# Patient Record
Sex: Female | Born: 1985
Health system: Southern US, Community
[De-identification: ages and names within clinical notes are randomized; demographics above are authoritative.]

## PROBLEM LIST (undated history)

## (undated) DIAGNOSIS — I82409 Acute embolism and thrombosis of unspecified deep veins of unspecified lower extremity: Secondary | ICD-10-CM

## (undated) DIAGNOSIS — Z982 Presence of cerebrospinal fluid drainage device: Secondary | ICD-10-CM

## (undated) DIAGNOSIS — G919 Hydrocephalus, unspecified: Secondary | ICD-10-CM

## (undated) DIAGNOSIS — R569 Unspecified convulsions: Secondary | ICD-10-CM

## (undated) HISTORY — PX: VENTRICULOPERITONEAL SHUNT: SHX204

## (undated) HISTORY — DX: Acute embolism and thrombosis of unspecified deep veins of unspecified lower extremity: I82.409

## (undated) HISTORY — PX: CARDIAC SURGERY: SHX584

---

## 2013-01-05 ENCOUNTER — Emergency Department (HOSPITAL_COMMUNITY): Payer: Medicaid Other

## 2013-01-05 ENCOUNTER — Encounter (HOSPITAL_COMMUNITY): Payer: Self-pay | Admitting: Emergency Medicine

## 2013-01-05 ENCOUNTER — Emergency Department (HOSPITAL_COMMUNITY)
Admission: EM | Admit: 2013-01-05 | Discharge: 2013-01-06 | Disposition: A | Discharge status: Home / Self Care | Payer: Medicaid Other | Attending: Emergency Medicine | Admitting: Emergency Medicine

## 2013-01-05 DIAGNOSIS — R109 Unspecified abdominal pain: Secondary | ICD-10-CM | POA: Insufficient documentation

## 2013-01-05 DIAGNOSIS — K5900 Constipation, unspecified: Secondary | ICD-10-CM | POA: Insufficient documentation

## 2013-01-05 DIAGNOSIS — G40909 Epilepsy, unspecified, not intractable, without status epilepticus: Secondary | ICD-10-CM | POA: Insufficient documentation

## 2013-01-05 DIAGNOSIS — Z982 Presence of cerebrospinal fluid drainage device: Secondary | ICD-10-CM | POA: Insufficient documentation

## 2013-01-05 DIAGNOSIS — G808 Other cerebral palsy: Secondary | ICD-10-CM | POA: Insufficient documentation

## 2013-01-05 DIAGNOSIS — Z8669 Personal history of other diseases of the nervous system and sense organs: Secondary | ICD-10-CM | POA: Insufficient documentation

## 2013-01-05 DIAGNOSIS — Z79899 Other long term (current) drug therapy: Secondary | ICD-10-CM | POA: Insufficient documentation

## 2013-01-05 HISTORY — DX: Unspecified convulsions: R56.9

## 2013-01-05 HISTORY — DX: Presence of cerebrospinal fluid drainage device: Z98.2

## 2013-01-05 HISTORY — DX: Hydrocephalus, unspecified: G91.9

## 2013-01-05 LAB — CBC WITH DIFFERENTIAL/PLATELET
Basophils Absolute: 0 10*3/uL (ref 0.0–0.1)
Basophils Relative: 0 % (ref 0–1)
Eosinophils Absolute: 0.1 10*3/uL (ref 0.0–0.7)
Eosinophils Relative: 1 % (ref 0–5)
HCT: 43.8 % (ref 36.0–46.0)
Hemoglobin: 15.9 g/dL — ABNORMAL HIGH (ref 12.0–15.0)
Lymphocytes Relative: 37 % (ref 12–46)
Lymphs Abs: 2.8 10*3/uL (ref 0.7–4.0)
MCH: 29.1 pg (ref 26.0–34.0)
MCHC: 36.3 g/dL — ABNORMAL HIGH (ref 30.0–36.0)
MCV: 80.1 fL (ref 78.0–100.0)
Monocytes Absolute: 0.5 10*3/uL (ref 0.1–1.0)
Monocytes Relative: 6 % (ref 3–12)
Neutro Abs: 4.1 10*3/uL (ref 1.7–7.7)
Neutrophils Relative %: 55 % (ref 43–77)
Platelets: 254 10*3/uL (ref 150–400)
RBC: 5.47 MIL/uL — ABNORMAL HIGH (ref 3.87–5.11)
RDW: 13.6 % (ref 11.5–15.5)
WBC: 7.5 10*3/uL (ref 4.0–10.5)

## 2013-01-05 LAB — URINALYSIS, ROUTINE W REFLEX MICROSCOPIC
Hgb urine dipstick: NEGATIVE
Nitrite: NEGATIVE
Specific Gravity, Urine: 1.042 — ABNORMAL HIGH (ref 1.005–1.030)
pH: 5.5 (ref 5.0–8.0)

## 2013-01-05 LAB — COMPREHENSIVE METABOLIC PANEL
ALT: 22 U/L (ref 0–35)
AST: 21 U/L (ref 0–37)
Albumin: 4.2 g/dL (ref 3.5–5.2)
Alkaline Phosphatase: 139 U/L — ABNORMAL HIGH (ref 39–117)
BUN: 8 mg/dL (ref 6–23)
CO2: 30 mEq/L (ref 19–32)
Calcium: 9.8 mg/dL (ref 8.4–10.5)
Chloride: 102 mEq/L (ref 96–112)
Creatinine, Ser: 0.57 mg/dL (ref 0.50–1.10)
GFR calc Af Amer: >90 mL/min (ref >90)
GFR calc non Af Amer: >90 mL/min (ref >90)
Glucose, Bld: 94 mg/dL (ref 70–99)
Potassium: 5.2 mEq/L — ABNORMAL HIGH (ref 3.5–5.1)
Sodium: 141 mEq/L (ref 135–145)
Total Bilirubin: 0.2 mg/dL — ABNORMAL LOW (ref 0.3–1.2)
Total Protein: 8.1 g/dL (ref 6.0–8.3)

## 2013-01-05 NOTE — ED Notes (Signed)
Pt presenting to ed with c/o per mother pt is not eating and drinking pt is not behaving her normal self per family. Per mother pt also with a red spot on her breast

## 2013-01-05 NOTE — ED Provider Notes (Signed)
History     CSN: [id number]  Arrival date & time 01/05/13  1730   First MD Initiated Contact with Patient 01/05/13 2023      Chief Complaint  Patient presents with  . decreased appetite    (Consider location/radiation/quality/duration/timing/severity/associated sxs/prior treatment) HPI History provided by patient's mother.  Pt has h/o hydrocephalus w/ VP shunt as well as CP.  She has had a poor appetite for the past 4-5 days.  Pushes food and fluids away and makes a disgusted face when it is offered to her.  Has also been more drowsy and irritable than normal.  Associated w/ tactile fever and rhinorrhea a couple days ago. Has not had coughing, vomiting or diarrhea.  Is wetting her diapers and having normal bowel movements.  They are concerned that she is having pain somewhere.  They are also concerned that there could be a problem with her shunt.   Past Medical History  Diagnosis Date  . Seizures   . Hydrocephalus   . VP (ventriculoperitoneal) shunt status     History reviewed. No pertinent past surgical history.  No family history on file.  History  Substance Use Topics  . Smoking status: Never Smoker   . Smokeless tobacco: Not on file  . Alcohol Use: No    OB History    Grav Para Term Preterm Abortions TAB SAB Ect Mult Living                  Review of Systems  All other systems reviewed and are negative.    Allergies  Penicillins  Home Medications   Current Outpatient Rx  Name  Route  Sig  Dispense  Refill  . PHENOBARBITAL 64.8 MG PO TABS   Oral   Take 129.6 mg by mouth at bedtime.           BP 140/86  Pulse 89  Temp 98.5 F (36.9 C) (Oral)  Resp 12  SpO2 100%  LMP 12/29/2012  Physical Exam  Nursing note and vitals reviewed. Constitutional: She is oriented to person, place, and time. She appears well-developed and well-nourished. No distress.       Pt is very active and talkative.  Does not appear uncomfortable  HENT:  Head: Normocephalic  and atraumatic.  Eyes:       Normal appearance  Neck: Normal range of motion.  Cardiovascular: Normal rate and regular rhythm.   Pulmonary/Chest: Effort normal and breath sounds normal. No respiratory distress.  Abdominal: Soft. Bowel sounds are normal. She exhibits no distension and no mass. There is no rebound and no guarding.       Exam limited d/t patient's inability to communicate.  Pt reports pain w/ palpation of abdomen w/ grimacing.   Genitourinary:       No CVA tenderness  Musculoskeletal: Normal range of motion.  Neurological: She is alert and oriented to person, place, and time.  Skin: Skin is warm and dry. No rash noted.  Psychiatric: She has a normal mood and affect. Her behavior is normal.    ED Course  Procedures (including critical care time)  Labs Reviewed  CBC WITH DIFFERENTIAL - Abnormal; Notable for the following:    RBC 5.47 (*)     Hemoglobin 15.9 (*)     MCHC 36.3 (*)     All other components within normal limits  COMPREHENSIVE METABOLIC PANEL - Abnormal; Notable for the following:    Potassium 5.2 (*)     Alkaline Phosphatase 139 (*)  Total Bilirubin 0.2 (*)     All other components within normal limits  URINALYSIS, ROUTINE W REFLEX MICROSCOPIC - Abnormal; Notable for the following:    Specific Gravity, Urine 1.042 (*)     Bilirubin Urine SMALL (*)     All other components within normal limits   Ct Abdomen Pelvis Wo Contrast  01/05/2013  *RADIOLOGY REPORT*  Clinical Data: Abdominal pain.  CT ABDOMEN AND PELVIS WITHOUT CONTRAST  Technique:  Multidetector CT imaging of the abdomen and pelvis was performed following the standard protocol without intravenous contrast.  Comparison: None.  Findings: Lung bases are clear.  No effusions.  Heart is normal size.  Liver, gallbladder, spleen, pancreas, adrenals and kidneys have an unremarkable unenhanced appearance.  A large stool burden throughout the colon.  Ventriculoperitoneal shunt catheter is noted in place  with the tip in the pelvis.  Small amount of free fluid in the pelvis.  Appendix is visualized and is normal.  Small bowel is decompressed. Stomach grossly unremarkable.  Urinary bladder is decompressed, grossly unremarkable.  No acute bony abnormality.  IMPRESSION: Large stool burden throughout the colon.  No acute findings in the abdomen or pelvis.   Original Report Authenticated By: Charlett Nose, M.D.    Ct Head Wo Contrast  01/05/2013  *RADIOLOGY REPORT*  Clinical Data: Patient with a ventriculostomy shunt.  Drowsiness and abnormal bony year.  CT HEAD WITHOUT CONTRAST  Technique:  Contiguous axial images were obtained from the base of the skull through the vertex without contrast.  Comparison: None.  Findings: There is a ventriculostomy catheter from a left frontal approach with tip in the region of the third ventricle.  There is no hydrocephalus present.  In fact, the ventricles are slit-like. Basilar cisterns are patent.  There is no intracranial hemorrhage. There is no focal mass lesion.  No CT evidence of acute infarction. 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  Paranasal sinuses mastoid air cells are clear. There is an abandoned catheter on the right.  IMPRESSION: No hydrocephalus in patient with a ventriculostomy catheter.  No acute intracranial findings.  Agenesis of the corpus callosum.   Original Report Authenticated By: Genevive Bi, M.D.      1. Constipation       MDM  26yo  F w/ CP and hydrocephalus brought to ED by her parents for decreased appetite, drowsiness and irritability x 4-5 days.  They are concerned she is having pain somewhere.  On exam, afebrile, NAD, abd soft and non-distended but ttp.  Labs, including U/A are unremarkable.  CT head negative for hydrocephalus.  CT abd ordered d/t exam and patient's inability communicate and shows large stool burden.  All results discussed w/ patient's parents.  D/c'd home w/ 5 day course of miralax.  Recommended f/u w/ PCP.  Return  precautions discussed.        Otilio Miu, PA-C 01/06/13 [phone]

## 2013-01-05 NOTE — ED Notes (Signed)
Patient transported to CT 

## 2013-01-05 NOTE — ED Notes (Signed)
PA at bedside.

## 2013-01-06 MED ORDER — POLYETHYLENE GLYCOL 3350 17 G PO PACK: 17.0000 g | PACK | Freq: Every day | ORAL | Status: AC

## 2013-01-08 NOTE — ED Provider Notes (Signed)
Medical screening examination/treatment/procedure(s) were performed by non-physician practitioner and as supervising physician I was immediately available for consultation/collaboration.  Pt with decreased po intake. Difficult to tell why because pt with hc of cp and not really communicative. Seems to be at her baseline otherwise. Ct with no hydrocephalus. Ct a/p with no acute abnormalities. No abdominal tenderness. Oropharynx clear. No v/d. Etiology not clear at this time, but no clear evidence of emergent process. Feel stable for dc. Emergent return precautions discussed. outpt fu otherwise.  Raeford Razor, MD 01/08/13 2102

## 2013-03-28 DIAGNOSIS — Z8669 Personal history of other diseases of the nervous system and sense organs: Secondary | ICD-10-CM | POA: Insufficient documentation

## 2013-03-28 DIAGNOSIS — H548 Legal blindness, as defined in USA: Secondary | ICD-10-CM | POA: Insufficient documentation

## 2013-12-13 DIAGNOSIS — R1310 Dysphagia, unspecified: Secondary | ICD-10-CM | POA: Insufficient documentation

## 2015-06-25 ENCOUNTER — Emergency Department (HOSPITAL_COMMUNITY): Payer: Medicaid Other

## 2015-06-25 ENCOUNTER — Encounter (HOSPITAL_COMMUNITY): Payer: Self-pay | Admitting: Emergency Medicine

## 2015-06-25 ENCOUNTER — Emergency Department (HOSPITAL_COMMUNITY)
Admission: EM | Admit: 2015-06-25 | Discharge: 2015-06-25 | Disposition: A | Discharge status: Home / Self Care | Payer: Medicaid Other | Attending: Emergency Medicine | Admitting: Emergency Medicine

## 2015-06-25 DIAGNOSIS — Z79899 Other long term (current) drug therapy: Secondary | ICD-10-CM | POA: Insufficient documentation

## 2015-06-25 DIAGNOSIS — Z982 Presence of cerebrospinal fluid drainage device: Secondary | ICD-10-CM | POA: Insufficient documentation

## 2015-06-25 DIAGNOSIS — Z880 Allergy status to penicillin: Secondary | ICD-10-CM | POA: Insufficient documentation

## 2015-06-25 DIAGNOSIS — Z8669 Personal history of other diseases of the nervous system and sense organs: Secondary | ICD-10-CM | POA: Insufficient documentation

## 2015-06-25 DIAGNOSIS — R0981 Nasal congestion: Secondary | ICD-10-CM | POA: Insufficient documentation

## 2015-06-25 DIAGNOSIS — R05 Cough: Secondary | ICD-10-CM | POA: Diagnosis not present

## 2015-06-25 DIAGNOSIS — R059 Cough, unspecified: Secondary | ICD-10-CM

## 2015-06-25 MED ORDER — GUAIFENESIN 100 MG/5ML PO LIQD: 100.0000 mg | ORAL | Status: DC | PRN

## 2015-06-25 MED ORDER — SALINE SPRAY 0.65 % NA SOLN: 1.0000 | NASAL | Status: DC | PRN

## 2015-06-25 MED ORDER — FLUTICASONE PROPIONATE 50 MCG/ACT NA SUSP: 2.0000 | Freq: Every day | NASAL | Status: DC

## 2015-06-25 NOTE — ED Provider Notes (Signed)
CSN: [id number]     Arrival date & time 06/25/15  [id number] History   First MD Initiated Contact with Patient 06/25/15 [phone]     Chief Complaint  Patient presents with  . Cough     (Consider location/radiation/quality/duration/timing/severity/associated sxs/prior Treatment) HPI Tracey Graham is a 29 y.o. female with history of MR, hydrocephalus with VP shunt, seizures, presents to emergency department complaining of nasal congestion, cough for 2 weeks. Mother is providing the history. Patient unable to blow her nose or cough up the mucosa mother is concerned. She went to her doctor 5 days ago while start amoxicillin which she finished yesterday. No other medications are given for her symptoms. Mother denies any history of seasonal allergies. There has not been any fever. Mother did note as the patient is not eating or drinking as much. She is concerned that she might be dehydrated. Patient otherwise does not appear to be in any distress. Father is concerned because he recently had pneumonia.  Past Medical History  Diagnosis Date  . Seizures   . Hydrocephalus   . VP (ventriculoperitoneal) shunt status    History reviewed. No pertinent past surgical history. No family history on file. History  Substance Use Topics  . Smoking status: Never Smoker   . Smokeless tobacco: Not on file  . Alcohol Use: No   OB History    No data available     Review of Systems  Unable to perform ROS: Patient nonverbal  Constitutional: Negative for fever and chills.  HENT: Positive for congestion.   Respiratory: Positive for cough.   Gastrointestinal: Negative for vomiting and diarrhea.  Skin: Negative for rash.      Allergies  Penicillins  Home Medications   Prior to Admission medications   Medication Sig Start Date End Date Taking? Authorizing Provider  amoxicillin (AMOXIL) 250 MG/5ML suspension Take 10 mLs by mouth 2 (two) times daily. For 5 days 06/20/15 06/25/15 Yes Historical Provider, MD   LORATADINE CHILDRENS 5 MG/5ML syrup Take 10 mLs by mouth daily. 06/20/15  Yes Historical Provider, MD  PHENobarbital (LUMINAL) 64.8 MG tablet Take 129.6 mg by mouth at bedtime.   Yes Historical Provider, MD  polyethylene glycol (MIRALAX) packet Take 17 g by mouth daily. Patient not taking: Reported on 06/25/2015 01/06/13   [name], PA-C   BP 145/99 mmHg  Pulse 89  Temp(Src) 97.9 F (36.6 C) (Oral)  Resp 18  SpO2 98% Physical Exam  Constitutional: She appears well-developed and well-nourished. No distress.  HENT:  Head: Normocephalic.  Right Ear: Tympanic membrane, external ear and ear canal normal.  Left Ear: Tympanic membrane, external ear and ear canal normal.  Nose: Mucosal edema and rhinorrhea present.  Mouth/Throat: Uvula is midline, oropharynx is clear and moist and mucous membranes are normal. No oropharyngeal exudate, posterior oropharyngeal edema, posterior oropharyngeal erythema or tonsillar abscesses.  Eyes: Conjunctivae are normal.  Neck: Neck supple.  Cardiovascular: Normal rate, regular rhythm and normal heart sounds.   Pulmonary/Chest: Effort normal and breath sounds normal. No respiratory distress. She has no wheezes. She has no rales.  Musculoskeletal: She exhibits no edema.  Neurological: She is alert.  Skin: Skin is warm and dry.  Psychiatric: She has a normal mood and affect. Her behavior is normal.  Nursing note and vitals reviewed.   ED Course  Procedures (including critical care time) Labs Review Labs Reviewed - No data to display  Imaging Review Dg Chest 2 View  06/25/2015   CLINICAL DATA:  Persistent cough  and congestion for 2 weeks after antibiotic therapy.  EXAM: CHEST  2 VIEW  COMPARISON:  None.  FINDINGS: Trachea is midline. Heart size normal. Lungs are somewhat low in volume with probable linear atelectasis or scarring in the medial left lower lobe. No airspace consolidation or pleural fluid.  Ventriculoperitoneal shunt catheter overlies  the left neck and chest as well as left upper quadrant. Calcification is seen along the proximal portion of the catheter. Visualized portion of the upper abdomen is unremarkable.  IMPRESSION: Low lung volumes without acute finding.   Electronically Signed   By: Leanna Battles M.D.   On: 06/25/2015 10:03     EKG Interpretation None      MDM   Final diagnoses:  Cough  Nasal congestion    Pt here with cough and nasal congestion for 2 wks. Pt with MR, unable to give any history. Family providing hx. Pt in NAD. VS are within normal. No fever. Not hypoxic. No tachycardia. Will get cxr.    10:46 AM CXR negative. Discussed with Dr. Micheline Maze, who has seen pt as well. Agrees this is most likely viral URI vs allergies. Just finished course of amoxil, do not think she needs additional antibiotics. Home with saline, flonase, mucinex. Follow up with pcp.   Filed Vitals:   06/25/15 0851 06/25/15 1040  BP: 145/99 135/96  Pulse: 89 88  Temp: 97.9 F (36.6 C) 98 F (36.7 C)  TempSrc: Oral Oral  Resp: 18 18  SpO2: 98% 99%       Jaynie Crumble, PA-C 06/25/15 1452  Toy Cookey, MD 06/27/15 1226

## 2015-06-25 NOTE — ED Notes (Signed)
Pt complaint of continued cough for 2 weeks post completion of amoxicillin dosage last night.

## 2015-06-25 NOTE — ED Notes (Signed)
Docherty at bedside. 

## 2015-06-25 NOTE — ED Notes (Signed)
Pt transported to [facility].  

## 2015-06-25 NOTE — Discharge Instructions (Signed)
flonase daily for nasal congestion. Use saline up to every 2 hrs to help thin nasal mucus. Robitussin as needed for cough. Follow up with primary care for recheck. No evidence for pneumonia today.   Upper Respiratory Infection, Adult An upper respiratory infection (URI) is also sometimes known as the common cold. The upper respiratory tract includes the nose, sinuses, throat, trachea, and bronchi. Bronchi are the airways leading to the lungs. Most people improve within 1 week, but symptoms can last up to 2 weeks. A residual cough may last even longer.  CAUSES Many different viruses can infect the tissues lining the upper respiratory tract. The tissues become irritated and inflamed and often become very moist. Mucus production is also common. A cold is contagious. You can easily spread the virus to others by oral contact. This includes kissing, sharing a glass, coughing, or sneezing. Touching your mouth or nose and then touching a surface, which is then touched by another person, can also spread the virus. SYMPTOMS  Symptoms typically develop 1 to 3 days after you come in contact with a cold virus. Symptoms vary from person to person. They may include:  Runny nose.  Sneezing.  Nasal congestion.  Sinus irritation.  Sore throat.  Loss of voice (laryngitis).  Cough.  Fatigue.  Muscle aches.  Loss of appetite.  Headache.  Low-grade fever. DIAGNOSIS  You might diagnose your own cold based on familiar symptoms, since most people get a cold 2 to 3 times a year. Your caregiver can confirm this based on your exam. Most importantly, your caregiver can check that your symptoms are not due to another disease such as strep throat, sinusitis, pneumonia, asthma, or epiglottitis. Blood tests, throat tests, and X-rays are not necessary to diagnose a common cold, but they may sometimes be helpful in excluding other more serious diseases. Your caregiver will decide if any further tests are  required. RISKS AND COMPLICATIONS  You may be at risk for a more severe case of the common cold if you smoke cigarettes, have chronic heart disease (such as heart failure) or lung disease (such as asthma), or if you have a weakened immune system. The very young and very old are also at risk for more serious infections. Bacterial sinusitis, middle ear infections, and bacterial pneumonia can complicate the common cold. The common cold can worsen asthma and chronic obstructive pulmonary disease (COPD). Sometimes, these complications can require emergency medical care and may be life-threatening. PREVENTION  The best way to protect against getting a cold is to practice good hygiene. Avoid oral or hand contact with people with cold symptoms. Wash your hands often if contact occurs. There is no clear evidence that vitamin C, vitamin E, echinacea, or exercise reduces the chance of developing a cold. However, it is always recommended to get plenty of rest and practice good nutrition. TREATMENT  Treatment is directed at relieving symptoms. There is no cure. Antibiotics are not effective, because the infection is caused by a virus, not by bacteria. Treatment may include:  Increased fluid intake. Sports drinks offer valuable electrolytes, sugars, and fluids.  Breathing heated mist or steam (vaporizer or shower).  Eating chicken soup or other clear broths, and maintaining good nutrition.  Getting plenty of rest.  Using gargles or lozenges for comfort.  Controlling fevers with ibuprofen or acetaminophen as directed by your caregiver.  Increasing usage of your inhaler if you have asthma. Zinc gel and zinc lozenges, taken in the first 24 hours of the common cold,  can shorten the duration and lessen the severity of symptoms. Pain medicines may help with fever, muscle aches, and throat pain. A variety of non-prescription medicines are available to treat congestion and runny nose. Your caregiver can make  recommendations and may suggest nasal or lung inhalers for other symptoms.  HOME CARE INSTRUCTIONS   Only take over-the-counter or prescription medicines for pain, discomfort, or fever as directed by your caregiver.  Use a warm mist humidifier or inhale steam from a shower to increase air moisture. This may keep secretions moist and make it easier to breathe.  Drink enough water and fluids to keep your urine clear or pale yellow.  Rest as needed.  Return to work when your temperature has returned to normal or as your caregiver advises. You may need to stay home longer to avoid infecting others. You can also use a face mask and careful hand washing to prevent spread of the virus. SEEK MEDICAL CARE IF:   After the first few days, you feel you are getting worse rather than better.  You need your caregiver's advice about medicines to control symptoms.  You develop chills, worsening shortness of breath, or brown or red sputum. These may be signs of pneumonia.  You develop yellow or brown nasal discharge or pain in the face, especially when you bend forward. These may be signs of sinusitis.  You develop a fever, swollen neck glands, pain with swallowing, or white areas in the back of your throat. These may be signs of strep throat. SEEK IMMEDIATE MEDICAL CARE IF:   You have a fever.  You develop severe or persistent headache, ear pain, sinus pain, or chest pain.  You develop wheezing, a prolonged cough, cough up blood, or have a change in your usual mucus (if you have chronic lung disease).  You develop sore muscles or a stiff neck. Document Released: 06/08/2001 Document Revised: 03/06/2012 Document Reviewed: 03/20/2014 [facility] Patient Information 2015 [place], [place]. This information is not intended to replace advice given to you by your health care provider. Make sure you discuss any questions you have with your health care provider.

## 2015-06-25 NOTE — ED Notes (Signed)
Pt returned from DG; awaiting results. 

## 2015-06-25 NOTE — ED Notes (Signed)
Tracey Graham at bedside 

## 2015-11-18 ENCOUNTER — Emergency Department (HOSPITAL_COMMUNITY)
Admission: EM | Admit: 2015-11-18 | Discharge: 2015-11-18 | Disposition: A | Discharge status: Home / Self Care | Payer: Medicaid Other | Attending: Emergency Medicine | Admitting: Emergency Medicine

## 2015-11-18 ENCOUNTER — Encounter (HOSPITAL_COMMUNITY): Payer: Self-pay | Admitting: Emergency Medicine

## 2015-11-18 DIAGNOSIS — Z79899 Other long term (current) drug therapy: Secondary | ICD-10-CM | POA: Insufficient documentation

## 2015-11-18 DIAGNOSIS — Z982 Presence of cerebrospinal fluid drainage device: Secondary | ICD-10-CM | POA: Insufficient documentation

## 2015-11-18 DIAGNOSIS — Z880 Allergy status to penicillin: Secondary | ICD-10-CM | POA: Diagnosis not present

## 2015-11-18 DIAGNOSIS — H109 Unspecified conjunctivitis: Secondary | ICD-10-CM | POA: Diagnosis not present

## 2015-11-18 DIAGNOSIS — Z7951 Long term (current) use of inhaled steroids: Secondary | ICD-10-CM | POA: Insufficient documentation

## 2015-11-18 DIAGNOSIS — H02849 Edema of unspecified eye, unspecified eyelid: Secondary | ICD-10-CM

## 2015-11-18 DIAGNOSIS — H0419 Other specified disorders of lacrimal gland: Secondary | ICD-10-CM | POA: Diagnosis not present

## 2015-11-18 DIAGNOSIS — Z8669 Personal history of other diseases of the nervous system and sense organs: Secondary | ICD-10-CM | POA: Insufficient documentation

## 2015-11-18 DIAGNOSIS — H540 Blindness, both eyes: Secondary | ICD-10-CM | POA: Diagnosis not present

## 2015-11-18 DIAGNOSIS — H5713 Ocular pain, bilateral: Secondary | ICD-10-CM | POA: Diagnosis present

## 2015-11-18 DIAGNOSIS — H11423 Conjunctival edema, bilateral: Secondary | ICD-10-CM | POA: Diagnosis not present

## 2015-11-18 MED ORDER — IBUPROFEN 800 MG PO TABS
800.0000 mg | ORAL_TABLET | Freq: Once | ORAL | Status: DC
  Filled 2015-11-18: qty 1

## 2015-11-18 MED ORDER — IBUPROFEN 100 MG/5ML PO SUSP: 400.0000 mg | Freq: Four times a day (QID) | ORAL | Status: DC | PRN

## 2015-11-18 MED ORDER — IBUPROFEN 100 MG/5ML PO SUSP
800.0000 mg | Freq: Once | ORAL | Status: AC
  Administered 2015-11-18: 800 mg via ORAL
  Filled 2015-11-18 (×2): qty 40

## 2015-11-18 MED ORDER — DIPHENHYDRAMINE HCL 12.5 MG/5ML PO ELIX
25.0000 mg | ORAL_SOLUTION | Freq: Once | ORAL | Status: AC
  Administered 2015-11-18: 25 mg via ORAL
  Filled 2015-11-18: qty 10

## 2015-11-18 MED ORDER — ERYTHROMYCIN 5 MG/GM OP OINT: TOPICAL_OINTMENT | OPHTHALMIC | Status: DC

## 2015-11-18 MED ORDER — DIPHENHYDRAMINE HCL 12.5 MG/5ML PO SYRP: 25.0000 mg | ORAL_SOLUTION | ORAL | Status: DC | PRN

## 2015-11-18 MED ORDER — DIPHENHYDRAMINE HCL 25 MG PO CAPS
25.0000 mg | ORAL_CAPSULE | Freq: Once | ORAL | Status: DC
  Filled 2015-11-18: qty 1

## 2015-11-18 MED ORDER — DIPHENHYDRAMINE HCL 25 MG PO CAPS: 25.0000 mg | ORAL_CAPSULE | Freq: Four times a day (QID) | ORAL | Status: DC | PRN

## 2015-11-18 MED ORDER — IBUPROFEN 800 MG PO TABS: 800.0000 mg | ORAL_TABLET | Freq: Three times a day (TID) | ORAL | Status: DC

## 2015-11-18 NOTE — ED Provider Notes (Signed)
CSN: [id number]     Arrival date & time 11/18/15  1103 History  By signing my name below, I, Tracey Graham, attest that this documentation has been prepared under the direction and in the presence of [name], PA-C. Electronically Signed: Tanda RockersMargaux Graham, ED Scribe. 11/18/2015. 12:02 PM.  Chief Complaint  Patient presents with  . Eye Pain  . Eye Drainage   The history is provided by the patient and a parent. No language interpreter was used.     HPI Comments: Tracey Graham is a 29 y.o. female with hx blindness in both eyes who presents to the Emergency Department complaining of gradual onset, constant, bilateral eye swelling that occurred this morning upon waking up. Family reports that pt seems to be in a lot of pain due to her crying. Mom mentions that pt had white drainage from both her eyes this morning as well. Pt has never had symptoms like this in the past. Denies fever or any other associated symptoms. Pt does not wear contact lenses.   Past Medical History  Diagnosis Date  . Seizures (HCC)   . Hydrocephalus   . VP (ventriculoperitoneal) shunt status    Past Surgical History  Procedure Laterality Date  . Ventriculoperitoneal shunt     Family History  Problem Relation Age of Onset  . Hypertension Mother   . Hypertension Father   . Diabetes Father    Social History  Substance Use Topics  . Smoking status: Never Smoker   . Smokeless tobacco: None  . Alcohol Use: No   OB History    No data available     Review of Systems  A complete 10 system review of systems was obtained and all systems are negative except as noted in the HPI and PMH.   Allergies  Penicillins  Home Medications   Prior to Admission medications   Medication Sig Start Date End Date Taking? Authorizing Provider  diphenhydrAMINE (BENADRYL) 25 mg capsule Take 1 capsule (25 mg total) by mouth every 6 (six) hours as needed. 11/18/15   Cheri FowlerKayla Shraddha Lebron, PA-C  erythromycin ophthalmic ointment Place a  1/2 inch ribbon of ointment into the lower eyelid QID for 5-7 days. 11/18/15   [name], PA-C  fluticasone (FLONASE) 50 MCG/ACT nasal spray Place 2 sprays into both nostrils daily. 06/25/15   [name], PA-C  guaiFENesin (ROBITUSSIN) 100 MG/5ML liquid Take 5-10 mLs (100-200 mg total) by mouth every 4 (four) hours as needed for cough. 06/25/15   [name], PA-C  ibuprofen (ADVIL,MOTRIN) 800 MG tablet Take 1 tablet (800 mg total) by mouth 3 (three) times daily. 11/18/15   [name], PA-C  LORATADINE CHILDRENS 5 MG/5ML syrup Take 10 mLs by mouth daily. 06/20/15   Historical Provider, MD  PHENobarbital (LUMINAL) 64.8 MG tablet Take 129.6 mg by mouth at bedtime.    Historical Provider, MD  polyethylene glycol (MIRALAX) packet Take 17 g by mouth daily. Patient not taking: Reported on 06/25/2015 01/06/13   [name], PA-C  sodium chloride (OCEAN) 0.65 % SOLN nasal spray Place 1 spray into both nostrils as needed for congestion. 06/25/15   Jaynie Crumbleatyana Kirichenko, PA-C   Triage Vitals: BP 144/97 mmHg  Pulse 95  Temp(Src) 97.8 F (36.6 C) (Oral)  Resp 18  Wt 150 lb (68.04 kg)  SpO2 100%  LMP 10/08/2015 (Approximate)   Physical Exam  Constitutional: She appears well-developed and well-nourished. Distressed: crying.  Special needs adult in wheelchair  HENT:  Head: Normocephalic and atraumatic.  Mouth/Throat:  Oropharynx is clear and moist.  Eyes: Pupils are equal, round, and reactive to light. Lids are everted and swept, no foreign bodies found. Right eye exhibits chemosis. Right eye exhibits no discharge and no exudate. No foreign body present in the right eye. Left eye exhibits chemosis. Left eye exhibits no discharge and no exudate. No foreign body present in the left eye. Right conjunctiva is injected. Left conjunctiva is not injected.  Neck: Normal range of motion. Neck supple.  Cardiovascular: Normal rate, regular rhythm and normal heart sounds.   Pulmonary/Chest: Effort  normal and breath sounds normal.  Abdominal: Soft. Bowel sounds are normal. She exhibits no distension. There is no tenderness.  Musculoskeletal: Normal range of motion.  Lymphadenopathy:    She has no cervical adenopathy.  Neurological: She is alert.  She is alert, but she is unable to follow commands.  Skin: Skin is warm and dry. She is not diaphoretic.  Psychiatric: She has a normal mood and affect. Her behavior is normal.    ED Course  Procedures (including critical care time)  DIAGNOSTIC STUDIES: Oxygen Saturation is 100% on RA, normal by my interpretation.    COORDINATION OF CARE: 12:01 PM-Discussed treatment plan which includes fluorescein strip test with pt at bedside and pt agreed to plan.   Labs Review Labs Reviewed - No data to display  Imaging Review No results found.   EKG Interpretation None      MDM   Final diagnoses:  Eyelid gland swelling, unspecified laterality  Bilateral conjunctivitis   Patient presents with b/l eye swelling, pain, and chemosis since this AM.  Patient non-toxic appearing, VSS.  On exam, b/l eyelids are swollen w/out erythema.  Chemosis bilaterally.  Conjunctiva mildly injected.  Mother reports some drainage, none visualized here.  No FB visualization.  Suspect irritation from itching throughout the night.  Possible conjunctivitis.  Doubt orbital cellulitis.  Doubt FB.  Doubt corneal abrasion/ulcer.  Will give benadryl and ibuprofen.  Will d/c home with erythromycin and warm compresses.   Case has been discussed with and seen by Dr. Patria Mane who agrees with the above plan for discharge and opthalmology follow up.   Evaluation does not show pathology requring ongoing emergent intervention or admission. Pt is hemodynamically stable and mentating appropriately. Discussed findings/results and plan with patient/guardian, who agrees with plan. All questions answered. Return precautions discussed and outpatient follow up given.    I personally  performed the services described in this documentation, which was scribed in my presence. The recorded information has been reviewed and is accurate.      Cheri Fowler, PA-C 11/18/15 1302  Azalia Bilis, MD 11/18/15 [phone]

## 2015-11-18 NOTE — Discharge Instructions (Signed)
Bacterial Conjunctivitis °Bacterial conjunctivitis, commonly called pink eye, is an inflammation of the clear membrane that covers the white part of the eye (conjunctiva). The inflammation can also happen on the underside of the eyelids. The blood vessels in the conjunctiva become inflamed, causing the eye to become red or pink. Bacterial conjunctivitis may spread easily from one eye to another and from person to person (contagious).  °CAUSES  °Bacterial conjunctivitis is caused by bacteria. The bacteria may come from your own skin, your upper respiratory tract, or from someone else with bacterial conjunctivitis. °SYMPTOMS  °The normally white color of the eye or the underside of the eyelid is usually pink or red. The pink eye is usually associated with irritation, tearing, and some sensitivity to light. Bacterial conjunctivitis is often associated with a thick, yellowish discharge from the eye. The discharge may turn into a crust on the eyelids overnight, which causes your eyelids to stick together. If a discharge is present, there may also be some blurred vision in the affected eye. °DIAGNOSIS  °Bacterial conjunctivitis is diagnosed by your caregiver through an eye exam and the symptoms that you report. Your caregiver looks for changes in the surface tissues of your eyes, which may point to the specific type of conjunctivitis. A sample of any discharge may be collected on a cotton-tip swab if you have a severe case of conjunctivitis, if your cornea is affected, or if you keep getting repeat infections that do not respond to treatment. The sample will be sent to a lab to see if the inflammation is caused by a bacterial infection and to see if the infection will respond to antibiotic medicines. °TREATMENT  °1. Bacterial conjunctivitis is treated with antibiotics. Antibiotic eyedrops are most often used. However, antibiotic ointments are also available. Antibiotics pills are sometimes used. Artificial tears or eye  washes may ease discomfort. °HOME CARE INSTRUCTIONS  °1. To ease discomfort, apply a cool, clean washcloth to your eye for 10-20 minutes, 3-4 times a day. °2. Gently wipe away any drainage from your eye with a warm, wet washcloth or a cotton ball. °3. Wash your hands often with soap and water. Use paper towels to dry your hands. °4. Do not share towels or washcloths. This may spread the infection. °5. Change or wash your pillowcase every day. °6. You should not use eye makeup until the infection is gone. °7. Do not operate machinery or drive if your vision is blurred. °8. Stop using contact lenses. Ask your caregiver how to sterilize or replace your contacts before using them again. This depends on the type of contact lenses that you use. °9. When applying medicine to the infected eye, do not touch the edge of your eyelid with the eyedrop bottle or ointment tube. °SEEK IMMEDIATE MEDICAL CARE IF:  °· Your infection has not improved within 3 days after beginning treatment. °· You had yellow discharge from your eye and it returns. °· You have increased eye pain. °· Your eye redness is spreading. °· Your vision becomes blurred. °· You have a fever or persistent symptoms for more than 2-3 days. °· You have a fever and your symptoms suddenly get worse. °· You have facial pain, redness, or swelling. °MAKE SURE YOU:  °· Understand these instructions. °· Will watch your condition. °· Will get help right away if you are not doing well or get worse. °  °This information is not intended to replace advice given to you by your health care provider. Make sure you   discuss any questions you have with your health care provider. °  °Document Released: 12/13/2005 Document Revised: 01/03/2015 Document Reviewed: 05/15/2012 °Elsevier Interactive Patient Education ©2016 [employer] ° °How to Use Eye Drops and Eye Ointments °HOW TO APPLY EYE DROPS °Follow these steps when applying eye drops: °2. Wash your hands. °3. Tilt your head  back. °4. Put a finger under your eye and use it to gently pull your lower lid downward. Keep that finger in place. °5. Using your other hand, hold the dropper between your thumb and index finger. °6. Position the dropper just over the edge of the lower lid. Hold it as close to your eye as you can without touching the dropper to your eye. °7. Steady your hand. One way to do this is to lean your index finger against your brow. °8. Look up. °9. Slowly and gently squeeze one drop of medicine into your eye. °10. Close your eye. °11. Place a finger between your lower eyelid and your nose. Press gently for 2 minutes. This increases the amount of time that the medicine is exposed to the eye. It also reduces side effects that can develop if the drop gets into the bloodstream through the nose. °HOW TO APPLY EYE OINTMENTS °Follow these steps when applying eye ointments: °10. Wash your hands. °11. Put a finger under your eye and use it to gently pull your lower lid downward. Keep that finger in place. °12. Using your other hand, place the tip of the tube between your thumb and index finger with the remaining fingers braced against your cheek or nose. °13. Hold the tube just over the edge of your lower lid without touching the tube to your lid or eyeball. °14. Look up. °15. Line the inner part of your lower lid with ointment. °16. Gently pull up on your upper lid and look down. This will force the ointment to spread over the surface of the eye. °17. Release the upper lid. °18. If you can, close your eyes for 1-2 minutes. °Do not rub your eyes. If you applied the ointment correctly, your vision will be blurry for a few minutes. This is normal. °ADDITIONAL INFORMATION °· Make sure to use the eye drops or ointment as told by your health care provider. °· If you have been told to use both eye drops and an eye ointment, apply the eye drops first, then wait 3-4 minutes before you apply the ointment. °· Try not to touch the tip of the  dropper or tube to your eye. A dropper or tube that has touched the eye can become contaminated. °  °This information is not intended to replace advice given to you by your health care provider. Make sure you discuss any questions you have with your health care provider. °  °Document Released: 03/21/2001 Document Revised: 04/29/2015 Document Reviewed: 12/09/2014 °Elsevier Interactive Patient Education ©2016 [employer] ° °

## 2015-11-18 NOTE — ED Notes (Signed)
Parents report that patient awoke, crying out in pain, c/o eye pain. Both eye lids are swollen , cloudy drainage noted, eye red. Pt is special needs adult. Responds appropriate to clear verbal  instructions

## 2016-01-09 ENCOUNTER — Encounter (HOSPITAL_COMMUNITY): Payer: Self-pay | Admitting: Emergency Medicine

## 2016-01-09 ENCOUNTER — Emergency Department (HOSPITAL_COMMUNITY)
Admission: EM | Admit: 2016-01-09 | Discharge: 2016-01-09 | Disposition: A | Discharge status: Home / Self Care | Payer: Medicaid Other | Attending: Emergency Medicine | Admitting: Emergency Medicine

## 2016-01-09 ENCOUNTER — Emergency Department (HOSPITAL_COMMUNITY): Payer: Medicaid Other

## 2016-01-09 DIAGNOSIS — K5900 Constipation, unspecified: Secondary | ICD-10-CM | POA: Diagnosis not present

## 2016-01-09 DIAGNOSIS — Z880 Allergy status to penicillin: Secondary | ICD-10-CM | POA: Insufficient documentation

## 2016-01-09 DIAGNOSIS — Z79899 Other long term (current) drug therapy: Secondary | ICD-10-CM | POA: Insufficient documentation

## 2016-01-09 LAB — CBC WITH DIFFERENTIAL/PLATELET
BASOS ABS: 0 10*3/uL (ref 0.0–0.1)
Basophils Relative: 0 %
EOS PCT: 0 %
Eosinophils Absolute: 0 10*3/uL (ref 0.0–0.7)
HCT: 41.2 % (ref 36.0–46.0)
Hemoglobin: 15 g/dL (ref 12.0–15.0)
LYMPHS ABS: 1.1 10*3/uL (ref 0.7–4.0)
LYMPHS PCT: 11 %
MCH: 29.2 pg (ref 26.0–34.0)
MCHC: 36.4 g/dL — ABNORMAL HIGH (ref 30.0–36.0)
MCV: 80.2 fL (ref 78.0–100.0)
MONO ABS: 0.8 10*3/uL (ref 0.1–1.0)
Monocytes Relative: 8 %
Neutro Abs: 8 10*3/uL — ABNORMAL HIGH (ref 1.7–7.7)
Neutrophils Relative %: 81 %
PLATELETS: 185 10*3/uL (ref 150–400)
RBC: 5.14 MIL/uL — ABNORMAL HIGH (ref 3.87–5.11)
RDW: 13.8 % (ref 11.5–15.5)
WBC: 9.9 10*3/uL (ref 4.0–10.5)

## 2016-01-09 LAB — COMPREHENSIVE METABOLIC PANEL
ALK PHOS: 134 U/L — AB (ref 38–126)
ALT: 20 U/L (ref 14–54)
AST: 18 U/L (ref 15–41)
Albumin: 4.2 g/dL (ref 3.5–5.0)
Anion gap: 13 (ref 5–15)
BILIRUBIN TOTAL: 0.5 mg/dL (ref 0.3–1.2)
BUN: 6 mg/dL (ref 6–20)
CALCIUM: 9.5 mg/dL (ref 8.9–10.3)
CO2: 22 mmol/L (ref 22–32)
CREATININE: 0.59 mg/dL (ref 0.44–1.00)
Chloride: 102 mmol/L (ref 101–111)
GFR calc non Af Amer: >60 mL/min (ref >60)
Glucose, Bld: 107 mg/dL — ABNORMAL HIGH (ref 65–99)
Potassium: 4.2 mmol/L (ref 3.5–5.1)
SODIUM: 137 mmol/L (ref 135–145)
TOTAL PROTEIN: 7.1 g/dL (ref 6.5–8.1)

## 2016-01-09 LAB — HCG, SERUM, QUALITATIVE: PREG SERUM: NEGATIVE

## 2016-01-09 LAB — LIPASE, BLOOD: Lipase: 16 U/L (ref 11–51)

## 2016-01-09 MED ORDER — FLEET ENEMA 7-19 GM/118ML RE ENEM
1.0000 | ENEMA | Freq: Once | RECTAL | Status: AC
  Administered 2016-01-09: 1 via RECTAL
  Filled 2016-01-09: qty 1

## 2016-01-09 MED ORDER — DOCUSATE SODIUM 50 MG/5ML PO LIQD: 100.0000 mg | Freq: Every day | ORAL | Status: DC | PRN

## 2016-01-09 MED ORDER — MORPHINE SULFATE (PF) 4 MG/ML IV SOLN
4.0000 mg | Freq: Once | INTRAVENOUS | Status: AC
  Administered 2016-01-09: 4 mg via INTRAMUSCULAR
  Filled 2016-01-09: qty 1

## 2016-01-09 MED ORDER — KETOROLAC TROMETHAMINE 30 MG/ML IJ SOLN
30.0000 mg | Freq: Once | INTRAMUSCULAR | Status: AC
  Administered 2016-01-09: 30 mg via INTRAMUSCULAR
  Filled 2016-01-09: qty 1

## 2016-01-09 MED ORDER — MILK AND MOLASSES ENEMA: 1.0000 | Freq: Once | RECTAL | Status: DC

## 2016-01-09 NOTE — ED Notes (Signed)
Patient mother states that patient hasn't had a bowel movement in several days.  They had called primary doctor who stated for patient to come to be checked.  Mother states she believes patient has an impaction.

## 2016-01-09 NOTE — ED Provider Notes (Signed)
CSN: [id number]     Arrival date & time 01/09/16  0708 History   First MD Initiated Contact with Patient 01/09/16 0815     Chief Complaint  Patient presents with  . Constipation     (Consider location/radiation/quality/duration/timing/severity/associated sxs/prior Treatment) HPI 30 year old female who presents with constipation. History of seizure disorder, shunted hydrocephalus with cognitive impairment. History is obtained from the patient's family, in the setting of her cognitive impairment. States that for the past month she has been having small and hard bowel movements. Her last bowel movement was a week ago, after having a small pellets yesterday evening. They have noted mild abdominal distention, and her complaining of intermittent abdominal discomfort. No fevers, chills, nausea or vomiting, changes to her baseline mental status. No changes to her urine output.    Past Medical History  Diagnosis Date  . Seizures (HCC)   . Hydrocephalus   . VP (ventriculoperitoneal) shunt status    Past Surgical History  Procedure Laterality Date  . Ventriculoperitoneal shunt     Family History  Problem Relation Age of Onset  . Hypertension Mother   . Hypertension Father   . Diabetes Father    Social History  Substance Use Topics  . Smoking status: Never Smoker   . Smokeless tobacco: None  . Alcohol Use: No   OB History    No data available     Review of Systems 10/14 systems reviewed and are negative other than those stated in the HPI according to parents    Allergies  Penicillins  Home Medications   Prior to Admission medications   Medication Sig Start Date End Date Taking? Authorizing Provider  PHENobarbital (LUMINAL) 97.2 MG tablet Take 194.4 mg by mouth at bedtime.   Yes Historical Provider, MD  polyethylene glycol (MIRALAX) packet Take 17 g by mouth daily. 01/06/13  Yes Catherine Schinlever, PA-C  diphenhydrAMINE (BENYLIN) 12.5 MG/5ML syrup Take 10 mLs (25 mg total) by  mouth every 4 (four) hours as needed for allergies. Patient not taking: Reported on 01/09/2016 11/18/15   Cheri Fowler, PA-C  docusate (COLACE) 50 MG/5ML liquid Take 10 mLs (100 mg total) by mouth daily as needed for mild constipation or moderate constipation. 01/09/16   Lavera Guise, MD  fluticasone (FLONASE) 50 MCG/ACT nasal spray Place 2 sprays into both nostrils daily. Patient not taking: Reported on 01/09/2016 06/25/15   [name], PA-C  guaiFENesin (ROBITUSSIN) 100 MG/5ML liquid Take 5-10 mLs (100-200 mg total) by mouth every 4 (four) hours as needed for cough. Patient not taking: Reported on 01/09/2016 06/25/15   [name], PA-C  ibuprofen (ADVIL,MOTRIN) 100 MG/5ML suspension Take 20 mLs (400 mg total) by mouth every 6 (six) hours as needed. Patient not taking: Reported on 01/09/2016 11/18/15   Cheri Fowler, PA-C  sodium chloride (OCEAN) 0.65 % SOLN nasal spray Place 1 spray into both nostrils as needed for congestion. Patient not taking: Reported on 01/09/2016 06/25/15   [name], PA-C   BP 140/84 mmHg  Pulse 95  Temp(Src) 98.7 F (37.1 C) (Oral)  Resp 18  Ht 5\' 2"  (1.575 m)  Wt 143 lb (64.864 kg)  BMI 26.15 kg/m2  SpO2 100%  LMP 10/15/2015 Physical Exam Physical Exam  Nursing note and vitals reviewed. Constitutional: well nourished, non-toxic, and in no acute distress Head: Normocephalic and atraumatic.  Mouth/Throat: Oropharynx is clear and moist.  Neck: Normal range of motion. Neck supple.  Cardiovascular: Normal rate and regular rhythm.   Pulmonary/Chest: Effort normal and  breath sounds normal.  Abdominal: Soft. Mild distension There is generalized mild tenderness. There is no rebound and no guarding. fecal impaction on rectal exam. Musculoskeletal: Normal range of motion.  Neurological: Alert, no facial droop, moves all extremities symmetrically Skin: Skin is warm and dry.  Psychiatric: Cooperative  ED Course  Procedures (including critical care  time) Labs Review Labs Reviewed  COMPREHENSIVE METABOLIC PANEL - Abnormal; Notable for the following:    Glucose, Bld 107 (*)    Alkaline Phosphatase 134 (*)    All other components within normal limits  CBC WITH DIFFERENTIAL/PLATELET - Abnormal; Notable for the following:    RBC 5.14 (*)    MCHC 36.4 (*)    Neutro Abs 8.0 (*)    All other components within normal limits  LIPASE, BLOOD  HCG, SERUM, QUALITATIVE  CBC WITH DIFFERENTIAL/PLATELET    Imaging Review Dg Abd Acute W/chest  01/09/2016  CLINICAL DATA:  30 year old female with nausea for 3 days. Constipation. Initial encounter. EXAM: DG ABDOMEN ACUTE W/ 1V CHEST COMPARISON:  Chest radiographs 06/25/2015. CT Abdomen and Pelvis 01/05/2013. FINDINGS: Supine views of the chest and abdomen. Left-side-down lateral decubitus view of the abdomen. Left side shunt catheter tubing tracks from the left neck into the abdomen as before. Low lung volumes. Normal cardiac size and mediastinal contours. Stable mediastinal surgical clip at the AP window. No confluent pulmonary opacity. No pneumoperitoneum on the decubitus view. Stable bowel gas pattern compared to 2014, non obstructed with retained stool in the rectum and right colon. Shunt catheter tubing position has not significantly changed. Abdominal and pelvic visceral contours appear stable. Stable visualized osseous structures. IMPRESSION: 1. Normal bowel gas pattern, no free air. Retained stool in the rectosigmoid colon and ascending colon. 2. Low lung volumes, otherwise no acute cardiopulmonary abnormality. Electronically Signed   By: Odessa FlemingH  Hall M.D.   On: 01/09/2016 09:37   I have personally reviewed and evaluated these images and lab results as part of my medical decision-making.   EKG Interpretation None      MDM   Final diagnoses:  Constipation, unspecified constipation type    30 year old female with history of cognitive impairment and seizure disorder with shunt and hydrocephalus who  presents with constipation. Vital signs are stable on arrival, and she is well-appearing and in no acute distress. Her abdomen is soft and nonsurgical. Mild distention with mild generalized tenderness. Basic blood work including CBC, comp metabolic panel, and lipase are unremarkable. An acute abdominal series reveals no evidence of obstruction or free air. She has a significant amount of stool in her a ascending colon and rectosigmoid. Rectal exam, she has a fecal impaction. This is manually disimpacted, and she is subsequently given Fleet enema and able to have a successful bowel movement. Abdomen remains benign on repeat exam. Patient initiated on daily MiraLAX and Colace until normalization of bowel movements. Strict return and follow-up instructions are reviewed with the patient's parents. Takes breast understanding of all discharge instructions for comfortable to plan of care.   Lavera Guiseana Duo Scotlynn Noyes, MD 01/09/16 1254

## 2016-01-09 NOTE — Discharge Instructions (Signed)
Please have your child take MiraLAX and Colace daily until bowel movements are soft and regular. Return for worsening symptoms, including fever, severe abdominal pain, vomiting and unable to keep down food/fluids, or any other symptoms concerning to you.   Constipation, Adult Constipation is when a person has fewer than three bowel movements a week, has difficulty having a bowel movement, or has stools that are dry, hard, or larger than normal. As people grow older, constipation is more common. A low-fiber diet, not taking in enough fluids, and taking certain medicines may make constipation worse.  CAUSES   Certain medicines, such as antidepressants, pain medicine, iron supplements, antacids, and water pills.   Certain diseases, such as diabetes, irritable bowel syndrome (IBS), thyroid disease, or depression.   Not drinking enough water.   Not eating enough fiber-rich foods.   Stress or travel.   Lack of physical activity or exercise.   Ignoring the urge to have a bowel movement.   Using laxatives too much.  SIGNS AND SYMPTOMS   Having fewer than three bowel movements a week.   Straining to have a bowel movement.   Having stools that are hard, dry, or larger than normal.   Feeling full or bloated.   Pain in the lower abdomen.   Not feeling relief after having a bowel movement.  DIAGNOSIS  Your health care provider will take a medical history and perform a physical exam. Further testing may be done for severe constipation. Some tests may include:  A barium enema X-ray to examine your rectum, colon, and, sometimes, your small intestine.   A sigmoidoscopy to examine your lower colon.   A colonoscopy to examine your entire colon. TREATMENT  Treatment will depend on the severity of your constipation and what is causing it. Some dietary treatments include drinking more fluids and eating more fiber-rich foods. Lifestyle treatments may include regular exercise. If  these diet and lifestyle recommendations do not help, your health care provider may recommend taking over-the-counter laxative medicines to help you have bowel movements. Prescription medicines may be prescribed if over-the-counter medicines do not work.  HOME CARE INSTRUCTIONS   Eat foods that have a lot of fiber, such as fruits, vegetables, whole grains, and beans.  Limit foods high in fat and processed sugars, such as french fries, hamburgers, cookies, candies, and soda.   A fiber supplement may be added to your diet if you cannot get enough fiber from foods.   Drink enough fluids to keep your urine clear or pale yellow.   Exercise regularly or as directed by your health care provider.   Go to the restroom when you have the urge to go. Do not hold it.   Only take over-the-counter or prescription medicines as directed by your health care provider. Do not take other medicines for constipation without talking to your health care provider first.  SEEK IMMEDIATE MEDICAL CARE IF:   You have bright red blood in your stool.   Your constipation lasts for more than 4 days or gets worse.   You have abdominal or rectal pain.   You have thin, pencil-like stools.   You have unexplained weight loss. MAKE SURE YOU:   Understand these instructions.  Will watch your condition.  Will get help right away if you are not doing well or get worse.   This information is not intended to replace advice given to you by your health care provider. Make sure you discuss any questions you have with your health  care provider.   Document Released: 09/10/2004 Document Revised: 01/03/2015 Document Reviewed: 09/24/2013 Elsevier Interactive Patient Education [employer].

## 2016-10-19 ENCOUNTER — Encounter (HOSPITAL_COMMUNITY): Payer: Self-pay

## 2016-10-19 ENCOUNTER — Emergency Department (HOSPITAL_COMMUNITY)
Admission: EM | Admit: 2016-10-19 | Discharge: 2016-10-19 | Disposition: A | Discharge status: Home / Self Care | Payer: Medicaid Other | Attending: Emergency Medicine | Admitting: Emergency Medicine

## 2016-10-19 DIAGNOSIS — H5713 Ocular pain, bilateral: Secondary | ICD-10-CM | POA: Diagnosis present

## 2016-10-19 DIAGNOSIS — H05252 Intermittent exophthalmos, left eye: Secondary | ICD-10-CM

## 2016-10-19 DIAGNOSIS — H11423 Conjunctival edema, bilateral: Secondary | ICD-10-CM | POA: Insufficient documentation

## 2016-10-19 MED ORDER — IBUPROFEN 100 MG/5ML PO SUSP
600.0000 mg | Freq: Once | ORAL | Status: AC
  Administered 2016-10-19: 600 mg via ORAL
  Filled 2016-10-19: qty 30

## 2016-10-19 MED ORDER — DIPHENHYDRAMINE HCL 12.5 MG/5ML PO ELIX
25.0000 mg | ORAL_SOLUTION | Freq: Once | ORAL | Status: AC
  Administered 2016-10-19: 25 mg via ORAL
  Filled 2016-10-19: qty 10

## 2016-10-19 MED ORDER — ERYTHROMYCIN 5 MG/GM OP OINT: TOPICAL_OINTMENT | OPHTHALMIC | 0 refills | Status: DC

## 2016-10-19 MED ORDER — IBUPROFEN 100 MG/5ML PO SUSP: 400.0000 mg | Freq: Four times a day (QID) | ORAL | 0 refills | Status: DC | PRN

## 2016-10-19 MED ORDER — DIPHENHYDRAMINE HCL 12.5 MG/5ML PO SYRP: 25.0000 mg | ORAL_SOLUTION | ORAL | 0 refills | Status: DC | PRN

## 2016-10-19 MED ORDER — IBUPROFEN 100 MG/5ML PO SUSP: 400.0000 mg | Freq: Once | ORAL | Status: DC

## 2016-10-19 MED ORDER — CETIRIZINE HCL 1 MG/ML PO SYRP: 10.0000 mg | ORAL_SOLUTION | Freq: Every day | ORAL | 0 refills | Status: DC

## 2016-10-19 NOTE — ED Triage Notes (Signed)
Pt presents w/ bilateral eye lid swelling, drainage, and irritation.  Pt's mother reports that the Pt's L eye was protruding prior to arrival.   Red sclera and clear drainage noted.

## 2016-10-19 NOTE — ED Provider Notes (Signed)
[facility]-EMERGENCY DEPT Provider Note   CSN: [id number] Arrival date & time: 10/19/16  0804     History   Chief Complaint Chief Complaint  Patient presents with  . Eye Pain  . Eye Drainage    HPI Inocencio HomesVanessa Spinnato is a 30 y.o. female.  The history is provided by a parent.  Eye Problem   This is a recurrent problem. The current episode started 3 to 5 hours ago. The problem occurs constantly. The problem has not changed since onset.There is a problem in both eyes. There was no injury mechanism. The pain is moderate. There is no history of trauma to the eye. There is no known exposure to pink eye. She does not wear contacts. Associated symptoms include eye redness. Associated symptoms comments: Itchiness, swelling, left eye became proptotic after scratching. She has tried nothing for the symptoms.    Past Medical History:  Diagnosis Date  . Hydrocephalus   . Seizures (HCC)   . VP (ventriculoperitoneal) shunt status     There are no active problems to display for this patient.   Past Surgical History:  Procedure Laterality Date  . VENTRICULOPERITONEAL SHUNT      OB History    No data available       Home Medications    Prior to Admission medications   Medication Sig Start Date End Date Taking? Authorizing Provider  diphenhydrAMINE (BENYLIN) 12.5 MG/5ML syrup Take 10 mLs (25 mg total) by mouth every 4 (four) hours as needed for allergies. Patient not taking: Reported on 01/09/2016 11/18/15   Cheri FowlerKayla Rose, PA-C  docusate (COLACE) 50 MG/5ML liquid Take 10 mLs (100 mg total) by mouth daily as needed for mild constipation or moderate constipation. 01/09/16   Lavera Guiseana Duo Liu, MD  fluticasone (FLONASE) 50 MCG/ACT nasal spray Place 2 sprays into both nostrils daily. Patient not taking: Reported on 01/09/2016 06/25/15   [name], PA-C  guaiFENesin (ROBITUSSIN) 100 MG/5ML liquid Take 5-10 mLs (100-200 mg total) by mouth every 4 (four) hours as needed for cough. Patient not  taking: Reported on 01/09/2016 06/25/15   [name], PA-C  ibuprofen (ADVIL,MOTRIN) 100 MG/5ML suspension Take 20 mLs (400 mg total) by mouth every 6 (six) hours as needed. Patient not taking: Reported on 01/09/2016 11/18/15   Cheri FowlerKayla Rose, PA-C  PHENobarbital (LUMINAL) 97.2 MG tablet Take 194.4 mg by mouth at bedtime.    Historical Provider, MD  polyethylene glycol (MIRALAX) packet Take 17 g by mouth daily. 01/06/13   Catherine Schinlever, PA-C  sodium chloride (OCEAN) 0.65 % SOLN nasal spray Place 1 spray into both nostrils as needed for congestion. Patient not taking: Reported on 01/09/2016 06/25/15   Jaynie Crumbleatyana Kirichenko, PA-C    Family History Family History  Problem Relation Age of Onset  . Hypertension Mother   . Hypertension Father   . Diabetes Father     Social History Social History  Substance Use Topics  . Smoking status: Never Smoker  . Smokeless tobacco: Never Used  . Alcohol use No     Allergies   Penicillins   Review of Systems Review of Systems  Eyes: Positive for redness.  All other systems reviewed and are negative.    Physical Exam Updated Vital Signs BP 132/58 (BP Location: Left Arm)   Pulse 102   Temp 98.3 F (36.8 C) (Oral)   Resp 18   LMP 09/10/2016   SpO2 100%   Physical Exam  Constitutional: She is oriented to person, place, and time. She appears  well-nourished. No distress.  HENT:  Head: Atraumatic.  Nose: Nose normal.  Eyes: Lids are normal. Right eye exhibits chemosis. Left eye exhibits chemosis.  No conjunctival injection, no facial swelling, no erythema or induration. Eyes with mild exophthalmos which is baseline per parents  Neck: Neck supple. No tracheal deviation present.  Cardiovascular: Normal rate and regular rhythm.   Pulmonary/Chest: Effort normal. No respiratory distress.  Abdominal: Soft. She exhibits no distension.  Neurological: She is alert and oriented to person, place, and time.  Skin: Skin is warm and dry.    Psychiatric: She has a normal mood and affect.     ED Treatments / Results  Labs (all labs ordered are listed, but only abnormal results are displayed) Labs Reviewed - No data to display  EKG  EKG Interpretation None       Radiology No results found.  Procedures Procedures (including critical care time)  Medications Ordered in ED Medications  diphenhydrAMINE (BENADRYL) 12.5 MG/5ML elixir 25 mg (not administered)  ibuprofen (ADVIL,MOTRIN) 100 MG/5ML suspension 600 mg (not administered)     Initial Impression / Assessment and Plan / ED Course  I have reviewed the triage vital signs and the nursing notes.  Pertinent labs & imaging results that were available during my care of the patient were reviewed by me and considered in my medical decision making (see chart for details).  Clinical Course    30 y.o. female presents with bilateral chemosis which she has had previously with repetitive eye scratching. Complains of itching currently.Parents brought her in today because lid on left eye became trapped behind globe but reduced spontaneously. Suspect she has some allergic conjunctivitis as this symptom previously responded to benadryl and NSAIDs. Will repeat therapy as was previously successful. Doubt corneal abrasion but provided topical ABx to prevent secondary infection. Will plan to schedule benadryl and follow with cetirizine therapy to prevent recurrence.    Final Clinical Impressions(s) / ED Diagnoses   Final diagnoses:  Chemosis of conjunctiva of both eyes  Exophthalmos, intermittent, left    New Prescriptions Discharge Medication List as of 10/19/2016  8:55 AM    START taking these medications   Details  cetirizine (ZYRTEC) 1 MG/ML syrup Take 10 mLs (10 mg total) by mouth daily., Starting Tue 10/19/2016, Print    erythromycin ophthalmic ointment Place a 1/2 inch ribbon of ointment into the lower eyelid., Print         Lyndal Pulley, MD 10/19/16 [phone]

## 2019-10-12 ENCOUNTER — Other Ambulatory Visit: Payer: Self-pay

## 2019-10-12 ENCOUNTER — Encounter (HOSPITAL_COMMUNITY): Payer: Self-pay

## 2019-10-12 ENCOUNTER — Emergency Department (HOSPITAL_COMMUNITY)
Admission: EM | Admit: 2019-10-12 | Discharge: 2019-10-12 | Disposition: A | Discharge status: Home / Self Care | Payer: Medicaid Other | Attending: Emergency Medicine | Admitting: Emergency Medicine

## 2019-10-12 DIAGNOSIS — R109 Unspecified abdominal pain: Secondary | ICD-10-CM | POA: Diagnosis present

## 2019-10-12 DIAGNOSIS — Z5321 Procedure and treatment not carried out due to patient leaving prior to being seen by health care provider: Secondary | ICD-10-CM | POA: Insufficient documentation

## 2019-10-12 DIAGNOSIS — R110 Nausea: Secondary | ICD-10-CM | POA: Insufficient documentation

## 2019-10-12 MED ORDER — SODIUM CHLORIDE 0.9% FLUSH: 3.0000 mL | Freq: Once | INTRAVENOUS | Status: DC

## 2019-10-12 NOTE — ED Notes (Signed)
I walked outside and went in the bathroom I did not see patient or her mother.  I was trying to take her back to a room.

## 2019-10-12 NOTE — ED Triage Notes (Signed)
Patient is unable to state exact pain,but patient's mother thinks it is her abdomen which has been hurting since 0800 today. Patient has not had vomiting or diarrhea,but patient's mother thinks she is nauseated. Patient Constantly yelling Mommy in triage and crying.

## 2019-10-13 ENCOUNTER — Encounter (HOSPITAL_COMMUNITY): Payer: Self-pay | Admitting: Emergency Medicine

## 2019-10-13 ENCOUNTER — Emergency Department (HOSPITAL_COMMUNITY): Payer: Medicaid Other

## 2019-10-13 ENCOUNTER — Inpatient Hospital Stay (HOSPITAL_COMMUNITY)
Admission: EM | Admit: 2019-10-13 | Discharge: 2019-11-12 | DRG: 091 | Disposition: A | Discharge status: Home / Self Care | Payer: Medicaid Other | Attending: Internal Medicine | Admitting: Internal Medicine

## 2019-10-13 ENCOUNTER — Other Ambulatory Visit: Payer: Self-pay

## 2019-10-13 DIAGNOSIS — T85730D Infection and inflammatory reaction due to ventricular intracranial (communicating) shunt, subsequent encounter: Secondary | ICD-10-CM

## 2019-10-13 DIAGNOSIS — Z20828 Contact with and (suspected) exposure to other viral communicable diseases: Secondary | ICD-10-CM | POA: Diagnosis present

## 2019-10-13 DIAGNOSIS — I82412 Acute embolism and thrombosis of left femoral vein: Secondary | ICD-10-CM | POA: Diagnosis present

## 2019-10-13 DIAGNOSIS — R0902 Hypoxemia: Secondary | ICD-10-CM

## 2019-10-13 DIAGNOSIS — K529 Noninfective gastroenteritis and colitis, unspecified: Secondary | ICD-10-CM | POA: Diagnosis present

## 2019-10-13 DIAGNOSIS — G919 Hydrocephalus, unspecified: Secondary | ICD-10-CM | POA: Diagnosis present

## 2019-10-13 DIAGNOSIS — G030 Nonpyogenic meningitis: Secondary | ICD-10-CM | POA: Diagnosis present

## 2019-10-13 DIAGNOSIS — R109 Unspecified abdominal pain: Secondary | ICD-10-CM

## 2019-10-13 DIAGNOSIS — G9349 Other encephalopathy: Secondary | ICD-10-CM | POA: Diagnosis present

## 2019-10-13 DIAGNOSIS — Z982 Presence of cerebrospinal fluid drainage device: Secondary | ICD-10-CM

## 2019-10-13 DIAGNOSIS — T85730A Infection and inflammatory reaction due to ventricular intracranial (communicating) shunt, initial encounter: Principal | ICD-10-CM

## 2019-10-13 DIAGNOSIS — K5900 Constipation, unspecified: Secondary | ICD-10-CM | POA: Diagnosis present

## 2019-10-13 DIAGNOSIS — Z8249 Family history of ischemic heart disease and other diseases of the circulatory system: Secondary | ICD-10-CM

## 2019-10-13 DIAGNOSIS — J189 Pneumonia, unspecified organism: Secondary | ICD-10-CM | POA: Diagnosis not present

## 2019-10-13 DIAGNOSIS — R000 Tachycardia, unspecified: Secondary | ICD-10-CM

## 2019-10-13 DIAGNOSIS — Y831 Surgical operation with implant of artificial internal device as the cause of abnormal reaction of the patient, or of later complication, without mention of misadventure at the time of the procedure: Secondary | ICD-10-CM | POA: Diagnosis present

## 2019-10-13 DIAGNOSIS — R319 Hematuria, unspecified: Secondary | ICD-10-CM | POA: Diagnosis not present

## 2019-10-13 DIAGNOSIS — K259 Gastric ulcer, unspecified as acute or chronic, without hemorrhage or perforation: Secondary | ICD-10-CM

## 2019-10-13 DIAGNOSIS — K3189 Other diseases of stomach and duodenum: Secondary | ICD-10-CM | POA: Diagnosis present

## 2019-10-13 DIAGNOSIS — T3695XA Adverse effect of unspecified systemic antibiotic, initial encounter: Secondary | ICD-10-CM | POA: Diagnosis not present

## 2019-10-13 DIAGNOSIS — G40909 Epilepsy, unspecified, not intractable, without status epilepticus: Secondary | ICD-10-CM | POA: Diagnosis present

## 2019-10-13 DIAGNOSIS — E876 Hypokalemia: Secondary | ICD-10-CM | POA: Diagnosis present

## 2019-10-13 DIAGNOSIS — K5289 Other specified noninfective gastroenteritis and colitis: Secondary | ICD-10-CM | POA: Diagnosis not present

## 2019-10-13 DIAGNOSIS — R140 Abdominal distension (gaseous): Secondary | ICD-10-CM

## 2019-10-13 DIAGNOSIS — D509 Iron deficiency anemia, unspecified: Secondary | ICD-10-CM | POA: Diagnosis not present

## 2019-10-13 DIAGNOSIS — Z993 Dependence on wheelchair: Secondary | ICD-10-CM

## 2019-10-13 DIAGNOSIS — Z781 Physical restraint status: Secondary | ICD-10-CM

## 2019-10-13 DIAGNOSIS — R188 Other ascites: Secondary | ICD-10-CM | POA: Diagnosis not present

## 2019-10-13 DIAGNOSIS — K317 Polyp of stomach and duodenum: Secondary | ICD-10-CM | POA: Diagnosis present

## 2019-10-13 DIAGNOSIS — R509 Fever, unspecified: Secondary | ICD-10-CM | POA: Diagnosis not present

## 2019-10-13 DIAGNOSIS — F79 Unspecified intellectual disabilities: Secondary | ICD-10-CM | POA: Diagnosis present

## 2019-10-13 DIAGNOSIS — R0602 Shortness of breath: Secondary | ICD-10-CM

## 2019-10-13 DIAGNOSIS — D72820 Lymphocytosis (symptomatic): Secondary | ICD-10-CM | POA: Diagnosis present

## 2019-10-13 DIAGNOSIS — K2960 Other gastritis without bleeding: Secondary | ICD-10-CM | POA: Diagnosis not present

## 2019-10-13 DIAGNOSIS — G9340 Encephalopathy, unspecified: Secondary | ICD-10-CM | POA: Diagnosis present

## 2019-10-13 DIAGNOSIS — Z833 Family history of diabetes mellitus: Secondary | ICD-10-CM

## 2019-10-13 DIAGNOSIS — Z452 Encounter for adjustment and management of vascular access device: Secondary | ICD-10-CM

## 2019-10-13 DIAGNOSIS — R4182 Altered mental status, unspecified: Secondary | ICD-10-CM

## 2019-10-13 DIAGNOSIS — D62 Acute posthemorrhagic anemia: Secondary | ICD-10-CM | POA: Diagnosis not present

## 2019-10-13 DIAGNOSIS — R030 Elevated blood-pressure reading, without diagnosis of hypertension: Secondary | ICD-10-CM | POA: Diagnosis not present

## 2019-10-13 DIAGNOSIS — K209 Esophagitis, unspecified without bleeding: Secondary | ICD-10-CM | POA: Diagnosis present

## 2019-10-13 DIAGNOSIS — T45515A Adverse effect of anticoagulants, initial encounter: Secondary | ICD-10-CM | POA: Diagnosis not present

## 2019-10-13 DIAGNOSIS — J9811 Atelectasis: Secondary | ICD-10-CM | POA: Diagnosis not present

## 2019-10-13 DIAGNOSIS — A419 Sepsis, unspecified organism: Secondary | ICD-10-CM | POA: Diagnosis not present

## 2019-10-13 DIAGNOSIS — I82409 Acute embolism and thrombosis of unspecified deep veins of unspecified lower extremity: Secondary | ICD-10-CM

## 2019-10-13 DIAGNOSIS — R1312 Dysphagia, oropharyngeal phase: Secondary | ICD-10-CM | POA: Diagnosis present

## 2019-10-13 DIAGNOSIS — J9601 Acute respiratory failure with hypoxia: Secondary | ICD-10-CM | POA: Diagnosis not present

## 2019-10-13 DIAGNOSIS — Z789 Other specified health status: Secondary | ICD-10-CM

## 2019-10-13 DIAGNOSIS — R451 Restlessness and agitation: Secondary | ICD-10-CM | POA: Diagnosis not present

## 2019-10-13 DIAGNOSIS — G809 Cerebral palsy, unspecified: Secondary | ICD-10-CM | POA: Diagnosis present

## 2019-10-13 DIAGNOSIS — Z880 Allergy status to penicillin: Secondary | ICD-10-CM

## 2019-10-13 DIAGNOSIS — R291 Meningismus: Secondary | ICD-10-CM | POA: Diagnosis present

## 2019-10-13 LAB — URINALYSIS, ROUTINE W REFLEX MICROSCOPIC
Bacteria, UA: NONE SEEN
Bilirubin Urine: NEGATIVE
Glucose, UA: NEGATIVE mg/dL
Hgb urine dipstick: NEGATIVE
Ketones, ur: 80 mg/dL — AB
Leukocytes,Ua: NEGATIVE
Nitrite: NEGATIVE
Protein, ur: 30 mg/dL — AB
Specific Gravity, Urine: 1.029 (ref 1.005–1.030)
pH: 5 (ref 5.0–8.0)

## 2019-10-13 LAB — PROTIME-INR
INR: 1.1 (ref 0.8–1.2)
Prothrombin Time: 14.5 seconds (ref 11.4–15.2)

## 2019-10-13 LAB — COMPREHENSIVE METABOLIC PANEL
ALT: 20 U/L (ref 0–44)
AST: 17 U/L (ref 15–41)
Albumin: 4 g/dL (ref 3.5–5.0)
Alkaline Phosphatase: 116 U/L (ref 38–126)
Anion gap: 14 (ref 5–15)
BUN: 5 mg/dL — ABNORMAL LOW (ref 6–20)
CO2: 22 mmol/L (ref 22–32)
Calcium: 9 mg/dL (ref 8.9–10.3)
Chloride: 101 mmol/L (ref 98–111)
Creatinine, Ser: 0.61 mg/dL (ref 0.44–1.00)
GFR calc Af Amer: >60 mL/min (ref >60)
GFR calc non Af Amer: >60 mL/min (ref >60)
Glucose, Bld: 108 mg/dL — ABNORMAL HIGH (ref 70–99)
Potassium: 4 mmol/L (ref 3.5–5.1)
Sodium: 137 mmol/L (ref 135–145)
Total Bilirubin: 0.5 mg/dL (ref 0.3–1.2)
Total Protein: 7.6 g/dL (ref 6.5–8.1)

## 2019-10-13 LAB — CBC WITH DIFFERENTIAL/PLATELET
Abs Immature Granulocytes: 0.04 10*3/uL (ref 0.00–0.07)
Basophils Absolute: 0 10*3/uL (ref 0.0–0.1)
Basophils Relative: 0 %
Eosinophils Absolute: 0 10*3/uL (ref 0.0–0.5)
Eosinophils Relative: 0 %
HCT: 42.7 % (ref 36.0–46.0)
Hemoglobin: 15.4 g/dL — ABNORMAL HIGH (ref 12.0–15.0)
Immature Granulocytes: 0 %
Lymphocytes Relative: 11 %
Lymphs Abs: 1.2 10*3/uL (ref 0.7–4.0)
MCH: 29.1 pg (ref 26.0–34.0)
MCHC: 36.1 g/dL — ABNORMAL HIGH (ref 30.0–36.0)
MCV: 80.7 fL (ref 80.0–100.0)
Monocytes Absolute: 0.7 10*3/uL (ref 0.1–1.0)
Monocytes Relative: 6 %
Neutro Abs: 9.4 10*3/uL — ABNORMAL HIGH (ref 1.7–7.7)
Neutrophils Relative %: 83 %
Platelets: 253 10*3/uL (ref 150–400)
RBC: 5.29 MIL/uL — ABNORMAL HIGH (ref 3.87–5.11)
RDW: 13.5 % (ref 11.5–15.5)
WBC: 11.4 10*3/uL — ABNORMAL HIGH (ref 4.0–10.5)
nRBC: 0 % (ref 0.0–0.2)

## 2019-10-13 LAB — I-STAT BETA HCG BLOOD, ED (MC, WL, AP ONLY): I-stat hCG, quantitative: <5 m[IU]/mL (ref <5)

## 2019-10-13 LAB — LACTIC ACID, PLASMA: Lactic Acid, Venous: 1.1 mmol/L (ref 0.5–1.9)

## 2019-10-13 MED ORDER — ONDANSETRON HCL 4 MG PO TABS: 4.0000 mg | ORAL_TABLET | Freq: Four times a day (QID) | ORAL | Status: DC | PRN

## 2019-10-13 MED ORDER — ACETAMINOPHEN 325 MG PO TABS
650.0000 mg | ORAL_TABLET | Freq: Four times a day (QID) | ORAL | Status: DC | PRN
  Administered 2019-10-15 – 2019-10-18 (×4): 650 mg via ORAL
  Filled 2019-10-13 (×4): qty 2

## 2019-10-13 MED ORDER — MORPHINE SULFATE (PF) 4 MG/ML IV SOLN
4.0000 mg | Freq: Once | INTRAVENOUS | Status: AC
  Administered 2019-10-13: 4 mg via INTRAVENOUS
  Filled 2019-10-13: qty 1

## 2019-10-13 MED ORDER — KETOROLAC TROMETHAMINE 15 MG/ML IJ SOLN
15.0000 mg | Freq: Four times a day (QID) | INTRAMUSCULAR | Status: DC | PRN
  Administered 2019-10-14 (×2): 15 mg via INTRAVENOUS
  Filled 2019-10-13 (×2): qty 1

## 2019-10-13 MED ORDER — SODIUM CHLORIDE 0.9 % IV SOLN
INTRAVENOUS | Status: AC
  Administered 2019-10-13: via INTRAVENOUS

## 2019-10-13 MED ORDER — PHENOBARBITAL 97.2 MG PO TABS
97.2000 mg | ORAL_TABLET | Freq: Every day | ORAL | Status: DC
  Filled 2019-10-13: qty 1

## 2019-10-13 MED ORDER — ACETAMINOPHEN 650 MG RE SUPP
650.0000 mg | Freq: Once | RECTAL | Status: AC
  Administered 2019-10-13: 650 mg via RECTAL
  Filled 2019-10-13: qty 1

## 2019-10-13 MED ORDER — ACETAMINOPHEN 650 MG RE SUPP
650.0000 mg | Freq: Once | RECTAL | Status: AC
  Administered 2019-10-13: 12:00:00 650 mg via RECTAL
  Filled 2019-10-13: qty 1

## 2019-10-13 MED ORDER — MORPHINE SULFATE (PF) 4 MG/ML IV SOLN
6.0000 mg | Freq: Once | INTRAVENOUS | Status: AC
  Administered 2019-10-13: 6 mg via INTRAMUSCULAR
  Filled 2019-10-13: qty 2

## 2019-10-13 MED ORDER — ENOXAPARIN SODIUM 40 MG/0.4ML ~~LOC~~ SOLN
40.0000 mg | SUBCUTANEOUS | Status: DC
  Administered 2019-10-14: 40 mg via SUBCUTANEOUS
  Filled 2019-10-13: qty 0.4

## 2019-10-13 MED ORDER — SODIUM CHLORIDE 0.9 % IV BOLUS
1000.0000 mL | Freq: Once | INTRAVENOUS | Status: AC
  Administered 2019-10-13: 1000 mL via INTRAVENOUS

## 2019-10-13 MED ORDER — FENTANYL CITRATE (PF) 100 MCG/2ML IJ SOLN
50.0000 ug | Freq: Once | INTRAMUSCULAR | Status: AC
  Administered 2019-10-13: 50 ug via INTRAVENOUS
  Filled 2019-10-13: qty 2

## 2019-10-13 MED ORDER — ONDANSETRON HCL 4 MG/2ML IJ SOLN
4.0000 mg | Freq: Four times a day (QID) | INTRAMUSCULAR | Status: DC | PRN
  Administered 2019-10-13 – 2019-11-09 (×18): 4 mg via INTRAVENOUS
  Filled 2019-10-13 (×20): qty 2

## 2019-10-13 MED ORDER — SENNOSIDES-DOCUSATE SODIUM 8.6-50 MG PO TABS
1.0000 | ORAL_TABLET | Freq: Every evening | ORAL | Status: DC | PRN
  Administered 2019-10-14: 1 via ORAL
  Filled 2019-10-13: qty 1

## 2019-10-13 MED ORDER — ACETAMINOPHEN 650 MG RE SUPP
650.0000 mg | Freq: Four times a day (QID) | RECTAL | Status: DC | PRN
  Administered 2019-10-14 – 2019-10-15 (×4): 650 mg via RECTAL
  Filled 2019-10-13 (×5): qty 1

## 2019-10-13 MED ORDER — PHENOBARBITAL SODIUM 65 MG/ML IJ SOLN
97.2000 mg | Freq: Every day | INTRAMUSCULAR | Status: DC
  Administered 2019-10-13 – 2019-10-20 (×8): 97.2 mg via INTRAVENOUS
  Filled 2019-10-13 (×6): qty 2
  Filled 2019-10-13: qty 1.5
  Filled 2019-10-13: qty 2

## 2019-10-13 NOTE — ED Notes (Signed)
Mom states pt is having some problems swallowing and she is concern about possible aspiration.

## 2019-10-13 NOTE — ED Provider Notes (Signed)
[facility] Provider Note   CSN: [id number] Arrival date & time: 10/13/19  [id number]     History   Chief Complaint Chief Complaint  Patient presents with  . Fever  . Abdominal Pain    HPI Tracey Graham is a 33 y.o. female.     HPI Patient presents to the emergency department with abdominal discomfort that started 2 days ago.  The patient's mother states she is also had fevers.  She was concerned that she may be constipated she has that is a chronic problem.  The mother states that she has not given her any medications prior to arrival for her symptoms.  The patient is not able to give me any history due to her hydrocephalus and mental retardation.  Mother states she said no vomiting, nausea, cough, lethargy or loss of consciousness. Past Medical History:  Diagnosis Date  . Hydrocephalus (HCC)   . Seizures (HCC)   . VP (ventriculoperitoneal) shunt status     There are no active problems to display for this patient.   Past Surgical History:  Procedure Laterality Date  . CARDIAC SURGERY    . VENTRICULOPERITONEAL SHUNT       OB History   No obstetric history on file.      Home Medications    Prior to Admission medications   Medication Sig Start Date End Date Taking? Authorizing Provider  acetaminophen (TYLENOL) 160 MG/5ML liquid Take 500 mg by mouth every 4 (four) hours as needed for fever.   Yes [provider]  diphenhydrAMINE (BENYLIN) 12.5 MG/5ML syrup Take 10 mLs (25 mg total) by mouth every 4 (four) hours as needed for allergies. 10/19/16  Yes Lyndal PulleyKnott, Daniel, MD  docusate (COLACE) 50 MG/5ML liquid Take 10 mLs (100 mg total) by mouth daily as needed for mild constipation or moderate constipation. 01/09/16  Yes Lavera GuiseLiu, Dana Duo, MD  fluticasone [facility](FLONASE) 50 MCG/ACT nasal spray Place 2 sprays into both nostrils daily. 06/25/15  Yes Kirichenko, Tatyana, PA-C  PHENobarbital (LUMINAL) 97.2 MG tablet Take 97.2 mg by mouth at  bedtime.    Yes [provider]  polyethylene glycol (MIRALAX) packet Take 17 g by mouth daily. 01/06/13  Yes Schinlever, Santina Evansatherine, PA-C  cetirizine (ZYRTEC) 1 MG/ML syrup Take 10 mLs (10 mg total) by mouth daily. Patient not taking: Reported on 10/13/2019 10/19/16   Lyndal PulleyKnott, Daniel, MD  erythromycin ophthalmic ointment Place a 1/2 inch ribbon of ointment into the lower eyelid. Patient not taking: Reported on 10/13/2019 10/19/16   Lyndal PulleyKnott, Daniel, MD  guaiFENesin (ROBITUSSIN) 100 MG/5ML liquid Take 5-10 mLs (100-200 mg total) by mouth every 4 (four) hours as needed for cough. Patient not taking: Reported on 10/13/2019 06/25/15   Jaynie CrumbleKirichenko, Tatyana, PA-C  ibuprofen (ADVIL,MOTRIN) 100 MG/5ML suspension Take 20 mLs (400 mg total) by mouth every 6 (six) hours as needed. Patient not taking: Reported on 10/13/2019 10/19/16   Lyndal PulleyKnott, Daniel, MD  sodium chloride (OCEAN) 0.65 % SOLN nasal spray Place 1 spray into both nostrils as needed for congestion. Patient not taking: Reported on 01/09/2016 06/25/15   Jaynie CrumbleKirichenko, Tatyana, PA-C    Family History Family History  Problem Relation Age of Onset  . Hypertension Mother   . Hypertension Father   . Diabetes Father     Social History Social History   Tobacco Use  . Smoking status: Never Smoker  . Smokeless tobacco: Never Used  Substance Use Topics  . Alcohol use: No  . Drug use: No  Allergies   Penicillins   Review of Systems Review of Systems Level 5 caveat applies due to mental retardation and hydrocephalus  Physical Exam Updated Vital Signs BP 99/85   Pulse (!) 113   Temp 99.3 F (37.4 C) (Rectal)   Resp 15   Ht 5\' 4"  (1.626 m)   Wt 72.6 kg   LMP 10/01/2019   SpO2 96%   BMI 27.46 kg/m   Physical Exam Vitals signs and nursing note reviewed.  Constitutional:      General: She is not in acute distress.    Appearance: She is well-developed.  HENT:     Head: Normocephalic and atraumatic.  Eyes:     Pupils: Pupils  are equal, round, and reactive to light.  Neck:     Musculoskeletal: Normal range of motion and neck supple.  Cardiovascular:     Rate and Rhythm: Normal rate and regular rhythm.     Heart sounds: Normal heart sounds. No murmur. No friction rub. No gallop.   Pulmonary:     Effort: Pulmonary effort is normal. No respiratory distress.     Breath sounds: Normal breath sounds. No wheezing.  Abdominal:     General: Bowel sounds are normal. There is no distension.     Palpations: Abdomen is soft.     Tenderness: There is abdominal tenderness in the right lower quadrant, periumbilical area and suprapubic area.  Skin:    General: Skin is warm and dry.     Capillary Refill: Capillary refill takes less than 2 seconds.     Findings: No erythema or rash.  Neurological:     Mental Status: She is alert. Mental status is at baseline.     Motor: No abnormal muscle tone.     Coordination: Coordination normal.  Psychiatric:        Behavior: Behavior normal.      ED Treatments / Results  Labs (all labs ordered are listed, but only abnormal results are displayed) Labs Reviewed  COMPREHENSIVE METABOLIC PANEL - Abnormal; Notable for the following components:      Result Value   Glucose, Bld 108 (*)    BUN 5 (*)    All other components within normal limits  CBC WITH DIFFERENTIAL/PLATELET - Abnormal; Notable for the following components:   WBC 11.4 (*)    RBC 5.29 (*)    Hemoglobin 15.4 (*)    MCHC 36.1 (*)    Neutro Abs 9.4 (*)    All other components within normal limits  CULTURE, BLOOD (ROUTINE X 2)  CULTURE, BLOOD (ROUTINE X 2)  LACTIC ACID, PLASMA  PROTIME-INR  LACTIC ACID, PLASMA  URINALYSIS, ROUTINE W REFLEX MICROSCOPIC  I-STAT BETA HCG BLOOD, ED ([facility], [facility], [facility] ONLY)    EKG None  Radiology Ct Abdomen Pelvis Wo Contrast  Result Date: 10/13/2019 CLINICAL DATA:  Generalized abdominal pain with nausea and fever EXAM: CT ABDOMEN AND PELVIS WITHOUT CONTRAST TECHNIQUE: Multidetector  CT imaging of the abdomen and pelvis was performed following the standard protocol without IV contrast. COMPARISON:  01/06/2013 FINDINGS: Lower chest: No acute abnormality. Hepatobiliary: Limited without IV contrast. No large focal hepatic abnormality or intrahepatic biliary dilatation. Gallbladder and biliary system unremarkable. Common bile duct nondilated. Pancreas: Unremarkable. No pancreatic ductal dilatation or surrounding inflammatory changes. Spleen: Normal in size without focal abnormality. Adrenals/Urinary Tract: Adrenal glands are unremarkable. Kidneys are normal, without renal calculi, focal lesion, or hydronephrosis. Bladder is unremarkable. Stomach/Bowel: Negative for bowel obstruction, significant dilatation, ileus, or free air.  Appendix is unremarkable and retrocecal in position. No acute inflammatory process, fluid collection, abscess, hemorrhage or hematoma Vascular/Lymphatic: Limited without IV contrast. No aneurysm. No adenopathy. Reproductive: Uterus and adnexa normal in size. Trace pelvic free fluid may be physiologic or related to the chronic VP shunt tubing. Other: No inguinal or abdominal wall hernia. Musculoskeletal: Degenerative changes of the spine. IMPRESSION: No acute intra-abdominal or pelvic finding by noncontrast CT. Trace lower abdominopelvic free fluid may be physiologic or related to the chronic VP shunt tubing. No fluid collection or abscess. Electronically Signed   By: Judie Petit.  Shick M.D.   On: 10/13/2019 12:40   Dg Chest 2 View  Result Date: 10/13/2019 CLINICAL DATA:  Fever and right-sided abdominal pain since yesterday. EXAM: CHEST - 2 VIEW COMPARISON:  01/09/2016. 06/25/2015. FINDINGS: Heart size is normal. Ductus clip present within the mediastinum. The lungs are clear. The vascularity is normal. No effusions. VP shunt overlies the left chest. IMPRESSION: No active disease. Electronically Signed   By: Paulina Fusi M.D.   On: 10/13/2019 09:38    Procedures Procedures  (including critical care time)  Medications Ordered in ED Medications  morphine 4 MG/ML injection 6 mg (6 mg Intramuscular Given 10/13/19 0958)  acetaminophen (TYLENOL) suppository 650 mg (650 mg Rectal Given 10/13/19 1130)  morphine 4 MG/ML injection 4 mg (4 mg Intravenous Given 10/13/19 1317)  sodium chloride 0.9 % bolus 1,000 mL (1,000 mLs Intravenous New Bag/Given 10/13/19 1421)     Initial Impression / Assessment and Plan / ED Course  I have reviewed the triage vital signs and the nursing notes.  Pertinent labs & imaging results that were available during my care of the patient were reviewed by me and considered in my medical decision making (see chart for details).      Patient has a negative CT scan of the abdomen and pelvis.  Patient will need urinalysis to look for signs of infection there.  The patient does not have any respiratory symptoms therefore seems less likely to be Covid.  Patient will need to be reassessed once the urine is back.  She is being hydrated with IV fluids.  Mother is kept apprised of the results and the plan.   Final Clinical Impressions(s) / ED Diagnoses   Final diagnoses:  None    ED Discharge Orders    None       Charlestine Night, PA-C 10/13/19 1508    Jacalyn Lefevre, MD 10/13/19 [phone]

## 2019-10-13 NOTE — ED Provider Notes (Signed)
Patient is a 33 year old female who has a history of cerebral palsy with hydrocephalus and a VP shunt who presents with abdominal pain and fevers.  She is difficult to assess given her nonverbal state but she does seem tender on palpation of her abdomen.  She does appear uncomfortable and is crying frequently.  She remains persistently tachycardic.  This is despite IV fluids in the ED.  Her blood pressure is been stable.  Her lactate is normal.  Her chest x-ray is clear without evidence of pneumonia.  CT scan of her abdomen pelvis showed no acute abnormalities.  I did do a shunt series and a CT scan of her head which showed no acute abnormalities.  I consulted with Dr. Zada Finders with neurosurgery who did not feel that given the patient's symptoms a tap of the VP shunt was indicated.  He said that would be unlikely to cause abdominal pain.  I spoke with Dr. Posey Pronto who admit the patient for further treatment.   Malvin Johns, MD 10/13/19 2228

## 2019-10-13 NOTE — ED Triage Notes (Signed)
Mom reports fever and R sided abd pain since yesterday.  Tylenol last given at 11pm last night.  States pt has difficulty with constipation.  Given Doculax at 5am.

## 2019-10-13 NOTE — H&P (Addendum)
History and Physical    Tracey Graham WJX:[id number] DOB: 1986-01-13 DOA: 10/13/2019  PCP: [facility]  Patient coming from: Home  I have personally briefly reviewed patient's old medical records in [place]  Chief Complaint: Fevers, abdominal pain  HPI: Tracey Graham is a 33 y.o. female with medical history significant for cerebral palsy, wheelchair-bound and fully dependent with ADLs, hydrocephalus s/p VP shunt, seizure disorder, and intellectual disability who presents to the ED for evaluation of fevers and abdominal pain.  Patient is unable to provide history herself therefore entirety of history is obtained from mother at bedside, EDP, and chart review.  Per mother, patient was in her usual state of health until she awoke the morning of 10/12/2019.  She was crying and appeared uncomfortable which is a new change for her.  She seemed to indicate that she has been having abdominal pain.  She has also been having fevers at home.  Mother thinks that she has been having some nausea without emesis.  She does have a history of dysphagia and does choke on food on occasion.  She has not had any cough.  She has not had any diarrhea.  Mother thinks that there is some decreased urinary frequency compared to normal.  She states the only medication she is taking his phenobarbital for history of seizures but has not had any seizures for many years.  ED Course:  Initial vitals showed BP 99/85, pulse 114, RR 13, temp 101.8 Fahrenheit, SPO2 95% on room air.  Labs are notable for WBC 11.4, hemoglobin 15.4, platelets 253,000, sodium 137, potassium 4.0, bicarb 22, BUN 5, creatinine 0.61, LFTs within normal limits, lactic acid 1.1.  Urinalysis was negative for UTI.  I-STAT beta-hCG <5.0.  Blood cultures were obtained and pending.  SARS-CoV-2 test was obtained and pending.  2 view chest x-ray was negative for focal consolidation, effusion, or infiltrate.  CT abdomen/pelvis  without contrast showed trace lower abdominal pelvic free fluid per radiology may be physiologic or related to chronic VP shunt tubing.  No fluid collection, abscess, or acute intra-abdominal or pelvic finding seen.  VP shunt series showed visualized VP shunt catheter intact on skull, chest, and abdominal x-rays.  CT head without contrast showed a left frontal approach CSF shunt which appeared stable along with decompressed ventricles since 2014.  No acute intracranial abnormality noted.  Patient was given multiple rounds of IV morphine for pain and 1 L normal saline.  EDP discussed the case with neurosurgery who did not feel tap of the VP shunt was indicated and that it would be unlikely to cause abdominal pain.  Review of Systems:  Unable to obtain full review of systems due to patient's intellectual disability.   Past Medical History:  Diagnosis Date  . Hydrocephalus ([place])   . Seizures ([place])   . VP (ventriculoperitoneal) shunt status     Past Surgical History:  Procedure Laterality Date  . CARDIAC SURGERY    . VENTRICULOPERITONEAL SHUNT      Social History:  reports that she has never smoked. She has never used smokeless tobacco. She reports that she does not drink alcohol or use drugs.  Allergies  Allergen Reactions  . Penicillins Swelling    Did not have reaction to 5d of amoxicillin Has patient had a PCN reaction causing immediate rash, facial/tongue/throat swelling, SOB or lightheadedness with hypotension: Yes Has patient had a PCN reaction causing severe rash involving mucus membranes or skin necrosis: No Has patient had a PCN  reaction that required hospitalization No Has patient had a PCN reaction occurring within the last 10 years: No If all of the above answers are "NO", then may proceed with Cephalosporin use.     Family History  Problem Relation Age of Onset  . Hypertension Mother   . Hypertension Father   . Diabetes Father      Prior to Admission  medications   Medication Sig Start Date End Date Taking? Authorizing Provider  acetaminophen (TYLENOL) 160 MG/5ML liquid Take 500 mg by mouth every 4 (four) hours as needed for fever.   Yes [provider]  diphenhydrAMINE (BENYLIN) 12.5 MG/5ML syrup Take 10 mLs (25 mg total) by mouth every 4 (four) hours as needed for allergies. 10/19/16  Yes Leo Grosser, MD  docusate (COLACE) 50 MG/5ML liquid Take 10 mLs (100 mg total) by mouth daily as needed for mild constipation or moderate constipation. 01/09/16  Yes Forde Dandy, MD  fluticasone [facility]) 50 MCG/ACT nasal spray Place 2 sprays into both nostrils daily. 06/25/15  Yes Kirichenko, Tatyana, PA-C  PHENobarbital (LUMINAL) 97.2 MG tablet Take 97.2 mg by mouth at bedtime.    Yes [provider]  polyethylene glycol (MIRALAX) packet Take 17 g by mouth daily. 01/06/13  Yes Schinlever, Barnetta Chapel, PA-C  cetirizine (ZYRTEC) 1 MG/ML syrup Take 10 mLs (10 mg total) by mouth daily. Patient not taking: Reported on 10/13/2019 10/19/16   Leo Grosser, MD  erythromycin ophthalmic ointment Place a 1/2 inch ribbon of ointment into the lower eyelid. Patient not taking: Reported on 10/13/2019 10/19/16   Leo Grosser, MD  guaiFENesin (ROBITUSSIN) 100 MG/5ML liquid Take 5-10 mLs (100-200 mg total) by mouth every 4 (four) hours as needed for cough. Patient not taking: Reported on 10/13/2019 06/25/15   Jeannett Senior, PA-C  ibuprofen (ADVIL,MOTRIN) 100 MG/5ML suspension Take 20 mLs (400 mg total) by mouth every 6 (six) hours as needed. Patient not taking: Reported on 10/13/2019 10/19/16   Leo Grosser, MD  sodium chloride (OCEAN) 0.65 % SOLN nasal spray Place 1 spray into both nostrils as needed for congestion. Patient not taking: Reported on 01/09/2016 06/25/15   Jeannett Senior, PA-C    Physical Exam: Vitals:   10/13/19 2130 10/13/19 2145 10/13/19 2200 10/13/19 2215  BP: (!) 130/106 (!) 138/115 (!) 146/100 (!) 145/75  Pulse: (!) 111  (!) 111 (!) 113 (!) 112  Resp: _0 Temp:      TempSrc:      SpO2: 97% 94% 94% 96%  Weight:      Height:       Exam limited due to cooperation. Constitutional: Resting in bed, appears uncomfortable and anxious Eyes: PERRL, lids and conjunctivae normal ENMT: Mucous membranes are dry.  Unable to visualize posterior pharynx or teeth due to cooperation.   Neck: normal, supple, no masses. Respiratory: clear to auscultation anteriorly.  Normal respiratory effort. No accessory muscle use.  Cardiovascular: Tachycardic, no murmurs / rubs / gallops. No extremity edema. Abdomen: Difficult to assess, appears in discomfort with generalized light palpation/touch.  Musculoskeletal: no clubbing / cyanosis.  Skin: no rashes, lesions, ulcers. No induration Neurologic: Difficult to assess due to chronic condition and cooperation.  Sensation appears intact, unable to assess strength. Psychiatric: Awake and alert otherwise not communicating, will only intermittently say "mommy.".    Labs on Admission: I have personally reviewed following labs and imaging studies  CBC: Recent Labs  Lab 10/13/19 1144  WBC 11.4*  NEUTROABS 9.4*  HGB 15.4*  HCT 42.7  MCV 80.7  PLT 250   Basic Metabolic Panel: Recent Labs  Lab 10/13/19 1144  NA 137  K 4.0  CL 101  CO2 22  GLUCOSE 108*  BUN 5*  CREATININE 0.61  CALCIUM 9.0   GFR: Estimated Creatinine Clearance: 98.8 mL/min (by C-G formula based on SCr of 0.61 mg/dL). Liver Function Tests: Recent Labs  Lab 10/13/19 1144  AST 17  ALT 20  ALKPHOS 116  BILITOT 0.5  PROT 7.6  ALBUMIN 4.0   No results for input(s): LIPASE, AMYLASE in the last 168 hours. No results for input(s): AMMONIA in the last 168 hours. Coagulation Profile: Recent Labs  Lab 10/13/19 1144  INR 1.1   Cardiac Enzymes: No results for input(s): CKTOTAL, CKMB, CKMBINDEX, TROPONINI in the last 168 hours. BNP (last 3 results) No results for input(s): PROBNP in the last  8760 hours. HbA1C: No results for input(s): HGBA1C in the last 72 hours. CBG: No results for input(s): GLUCAP in the last 168 hours. Lipid Profile: No results for input(s): CHOL, HDL, LDLCALC, TRIG, CHOLHDL, LDLDIRECT in the last 72 hours. Thyroid Function Tests: No results for input(s): TSH, T4TOTAL, FREET4, T3FREE, THYROIDAB in the last 72 hours. Anemia Panel: No results for input(s): VITAMINB12, FOLATE, FERRITIN, TIBC, IRON, RETICCTPCT in the last 72 hours. Urine analysis:    Component Value Date/Time   COLORURINE YELLOW 10/13/2019 1710   APPEARANCEUR HAZY (A) 10/13/2019 1710   LABSPEC 1.029 10/13/2019 1710   PHURINE 5.0 10/13/2019 1710   GLUCOSEU NEGATIVE 10/13/2019 1710   HGBUR NEGATIVE 10/13/2019 1710   BILIRUBINUR NEGATIVE 10/13/2019 1710   KETONESUR 80 (A) 10/13/2019 1710   PROTEINUR 30 (A) 10/13/2019 1710   UROBILINOGEN 1.0 01/05/2013 2116   NITRITE NEGATIVE 10/13/2019 1710   LEUKOCYTESUR NEGATIVE 10/13/2019 1710    Radiological Exams on Admission: Ct Abdomen Pelvis Wo Contrast  Result Date: 10/13/2019 CLINICAL DATA:  Generalized abdominal pain with nausea and fever EXAM: CT ABDOMEN AND PELVIS WITHOUT CONTRAST TECHNIQUE: Multidetector CT imaging of the abdomen and pelvis was performed following the standard protocol without IV contrast. COMPARISON:  01/06/2013 FINDINGS: Lower chest: No acute abnormality. Hepatobiliary: Limited without IV contrast. No large focal hepatic abnormality or intrahepatic biliary dilatation. Gallbladder and biliary system unremarkable. Common bile duct nondilated. Pancreas: Unremarkable. No pancreatic ductal dilatation or surrounding inflammatory changes. Spleen: Normal in size without focal abnormality. Adrenals/Urinary Tract: Adrenal glands are unremarkable. Kidneys are normal, without renal calculi, focal lesion, or hydronephrosis. Bladder is unremarkable. Stomach/Bowel: Negative for bowel obstruction, significant dilatation, ileus, or free air.  Appendix is unremarkable and retrocecal in position. No acute inflammatory process, fluid collection, abscess, hemorrhage or hematoma Vascular/Lymphatic: Limited without IV contrast. No aneurysm. No adenopathy. Reproductive: Uterus and adnexa normal in size. Trace pelvic free fluid may be physiologic or related to the chronic VP shunt tubing. Other: No inguinal or abdominal wall hernia. Musculoskeletal: Degenerative changes of the spine. IMPRESSION: No acute intra-abdominal or pelvic finding by noncontrast CT. Trace lower abdominopelvic free fluid may be physiologic or related to the chronic VP shunt tubing. No fluid collection or abscess. Electronically Signed   By: Jerilynn Mages.  Shick M.D.   On: 10/13/2019 12:40   Dg Skull 1-3 Views  Result Date: 10/13/2019 CLINICAL DATA:  33 year old female with fever and right-sided abdominal pain since yesterday. Evaluate for shunt malfunction. EXAM: SKULL - 1-3 VIEW COMPARISON:  No priors. FINDINGS: Left-sided ventriculoperitoneal shunt catheter noted, visualized portions of which are intact. IMPRESSION: 1. Visualized ventriculoperitoneal shunt catheter  is intact. Electronically Signed   By: Vinnie Langton M.D.   On: 10/13/2019 18:30   Dg Chest 1 View  Result Date: 10/13/2019 CLINICAL DATA:  33 year old female with history of right-sided abdominal pain since yesterday. Evaluate for potential shunt malfunction. EXAM: CHEST  1 VIEW COMPARISON:  Chest x-ray 10/13/2019. FINDINGS: The heart size and mediastinal contours are within normal limits. Both lungs are clear. The visualized skeletal structures are unremarkable. Surgical clips in the left upper mediastinum. Ventriculoperitoneal shunt catheter projecting over the left hemithorax and upper left abdomen, which appears intact without obvious shunt discontinuities or kinks. IMPRESSION: 1. Ventriculoperitoneal shunt catheter as visualized appears intact. 2. No radiographic evidence of acute cardiopulmonary disease.  Electronically Signed   By: Vinnie Langton M.D.   On: 10/13/2019 18:22   Dg Chest 2 View  Result Date: 10/13/2019 CLINICAL DATA:  Fever and right-sided abdominal pain since yesterday. EXAM: CHEST - 2 VIEW COMPARISON:  01/09/2016. 06/25/2015. FINDINGS: Heart size is normal. Ductus clip present within the mediastinum. The lungs are clear. The vascularity is normal. No effusions. VP shunt overlies the left chest. IMPRESSION: No active disease. Electronically Signed   By: Nelson Chimes M.D.   On: 10/13/2019 09:38   Dg Abd 1 View  Result Date: 10/13/2019 CLINICAL DATA:  33 year old female.  Evaluate for shunt malfunction. EXAM: ABDOMEN - 1 VIEW COMPARISON:  Abdominal radiograph 01/09/2016. FINDINGS: Ventriculoperitoneal shunt catheter seen projecting over the left side of the abdomen, coiled into the mid lower abdomen and upper pelvis. Visualized portions of the catheter appear intact, although there appears to be extra catheter fragments in the low abdomen and upper pelvis. Gas and stool is noted throughout the colon extending to the level of the distal rectum. No definite pathologic dilatation of small bowel. IMPRESSION: 1. Ventriculoperitoneal shunt catheter appears intact. 2. Residual catheter fragment in the lower abdomen, similar to prior examinations. 3. Nonobstructive bowel gas pattern. Electronically Signed   By: Vinnie Langton M.D.   On: 10/13/2019 18:28   Ct Head Wo Contrast  Result Date: 10/13/2019 CLINICAL DATA:  33 year old female with unexplained altered mental status. EXAM: CT HEAD WITHOUT CONTRAST TECHNIQUE: Contiguous axial images were obtained from the base of the skull through the vertex without intravenous contrast. COMPARISON:  [facility] head CT 01/05/2013. FINDINGS: Brain: Scaphocephaly. Dysgenesis of the corpus callosum. Mildly dysplastic appearing ventricles which remain decompressed since 2014 in the setting of a left superior frontal approach CSF shunt. Parietal and  occipital lobe chronic volume loss. Relatively normal appearing posterior fossa. No midline shift, ventriculomegaly, mass effect, evidence of mass lesion, intracranial hemorrhage or evidence of cortically based acute infarction. Vascular: No suspicious intracranial vascular hyperdensity. Skull: Chronic superior frontal burr holes. No acute osseous abnormality identified. Sinuses/Orbits: Visualized paranasal sinuses and mastoids are stable and well pneumatized. Other: Left convexity CSF reservoir with shunt tubing continuing caudally in the left neck. Partial calcification of the catheter beginning in the left suboccipital region, but no adverse features. Chronic postoperative changes to the contralateral right scalp. Rightward gaze deviation. IMPRESSION: 1. No acute intracranial abnormality. 2. Left frontal approach CSF shunt appears stable along with decompressed ventricles since 2014. 3. Underlying dysgenesis of the corpus callosum, scaphocephaly, chronic parieto-occipital cerebral volume loss. Electronically Signed   By: Genevie Ann M.D.   On: 10/13/2019 18:40    EKG: Not performed.  Assessment/Plan Principal Problem:   Fever of unknown origin (FUO) Active Problems:   Hydrocephalus (H[facility])   VP (ventriculoperitoneal) shunt status  Seizure disorder ([place])  Lynn Recendiz is a 33 y.o. female with medical history significant for cerebral palsy, wheelchair-bound and fully dependent with ADLs, hydrocephalus s/p VP shunt, seizure disorder, and intellectual disability who is admitted for fever of unknown origin with abdominal pain.   Fever of unknown origin with abdominal pain: Unclear etiology.  Question of urinary retention per mother, urinalysis is negative for UTI.  Possible aspiration pneumonitis/pneumonia given history however chest x-ray does not show infiltrate.  She remains tachycardic.  CT abdomen/pelvis without acute etiology.  EDP discussed with neurosurgery who did not feel tap of VP shunt was  indicated.  Appears to be having worsening nausea without emesis suggesting possible viral gastroenteritis. -Follow blood cultures -SARS-CoV-2 test pending -Check ESR, CRP, LDH, HIV -Check D-dimer, if elevated consider lower extremity Dopplers and/or CTA chest -SLP eval to assess aspiration risk -Check orthopantogram to assess for dental infection -Continue IV fluids -Bladder scan as needed to assess for urinary retention -Continue prn antiemetics, pain/fever control with Tylenol and Toradol as needed -Will hold antibiotics for now  Hydrocephalus s/p chronic VP shunt: VP shunt series showed visualized VP shunt catheter intact on skull, chest, and abdominal x-rays.  CT head without contrast showed a left frontal approach CSF shunt which appeared stable along with decompressed ventricles since 2014.  As above, per EDP conversation with neurosurgery it was felt VP shunt was not likely contributing to presentation and tap not currently indicated.  Seizure disorder: Continue phenobarbital, converted to IV formulation given question of aspiration.  DVT prophylaxis: Lovenox Code Status: Full code, confirmed with mother Family Communication: Discussed with mother at bedside Disposition Plan: Pending clinical progress Consults called: None Admission status: Observation   Zada Finders MD [facility]  If 7PM-7AM, please contact night-coverage [email or web address]  10/13/2019, 11:09 PM

## 2019-10-13 NOTE — ED Notes (Signed)
Phenobarbital held due to pt NPO status. MD aware and considering an alternative. Waste medication given to [employer], Occupational psychologist.

## 2019-10-13 NOTE — ED Notes (Signed)
Patient transported to CT 

## 2019-10-13 NOTE — ED Notes (Signed)
Pt has had 3 RN from ED attempt IV and lab draw. Phlebotomist has attempted lab draw. IV team now at bedside. Pt pain has improved.

## 2019-10-13 NOTE — ED Notes (Addendum)
Attempted In and Out per RN, Kathlee Nations, request, to obtain urine specimen. Catheter placed correctly, no urine return. Pt had thick, white discharge. EDP, Irena Cords, and RN, Cristopher Peru., notified. Bladder scan to be performed per EDP request.

## 2019-10-13 NOTE — ED Notes (Signed)
Pt have IV access. CT aware

## 2019-10-14 ENCOUNTER — Observation Stay (HOSPITAL_COMMUNITY): Payer: Medicaid Other

## 2019-10-14 DIAGNOSIS — R4182 Altered mental status, unspecified: Secondary | ICD-10-CM | POA: Diagnosis not present

## 2019-10-14 DIAGNOSIS — G030 Nonpyogenic meningitis: Secondary | ICD-10-CM | POA: Diagnosis present

## 2019-10-14 DIAGNOSIS — T85730D Infection and inflammatory reaction due to ventricular intracranial (communicating) shunt, subsequent encounter: Secondary | ICD-10-CM | POA: Diagnosis not present

## 2019-10-14 DIAGNOSIS — K259 Gastric ulcer, unspecified as acute or chronic, without hemorrhage or perforation: Secondary | ICD-10-CM | POA: Diagnosis present

## 2019-10-14 DIAGNOSIS — D72829 Elevated white blood cell count, unspecified: Secondary | ICD-10-CM | POA: Diagnosis not present

## 2019-10-14 DIAGNOSIS — M542 Cervicalgia: Secondary | ICD-10-CM | POA: Diagnosis not present

## 2019-10-14 DIAGNOSIS — K209 Esophagitis, unspecified without bleeding: Secondary | ICD-10-CM | POA: Diagnosis present

## 2019-10-14 DIAGNOSIS — R197 Diarrhea, unspecified: Secondary | ICD-10-CM | POA: Diagnosis not present

## 2019-10-14 DIAGNOSIS — T85730A Infection and inflammatory reaction due to ventricular intracranial (communicating) shunt, initial encounter: Secondary | ICD-10-CM | POA: Diagnosis present

## 2019-10-14 DIAGNOSIS — K3189 Other diseases of stomach and duodenum: Secondary | ICD-10-CM | POA: Diagnosis present

## 2019-10-14 DIAGNOSIS — Z20828 Contact with and (suspected) exposure to other viral communicable diseases: Secondary | ICD-10-CM | POA: Diagnosis present

## 2019-10-14 DIAGNOSIS — G40909 Epilepsy, unspecified, not intractable, without status epilepticus: Secondary | ICD-10-CM | POA: Diagnosis present

## 2019-10-14 DIAGNOSIS — D62 Acute posthemorrhagic anemia: Secondary | ICD-10-CM | POA: Diagnosis not present

## 2019-10-14 DIAGNOSIS — K5900 Constipation, unspecified: Secondary | ICD-10-CM | POA: Diagnosis not present

## 2019-10-14 DIAGNOSIS — F79 Unspecified intellectual disabilities: Secondary | ICD-10-CM | POA: Diagnosis not present

## 2019-10-14 DIAGNOSIS — J9601 Acute respiratory failure with hypoxia: Secondary | ICD-10-CM | POA: Diagnosis not present

## 2019-10-14 DIAGNOSIS — G919 Hydrocephalus, unspecified: Secondary | ICD-10-CM | POA: Diagnosis present

## 2019-10-14 DIAGNOSIS — R509 Fever, unspecified: Secondary | ICD-10-CM | POA: Diagnosis present

## 2019-10-14 DIAGNOSIS — D72820 Lymphocytosis (symptomatic): Secondary | ICD-10-CM | POA: Diagnosis present

## 2019-10-14 DIAGNOSIS — R140 Abdominal distension (gaseous): Secondary | ICD-10-CM | POA: Diagnosis not present

## 2019-10-14 DIAGNOSIS — Y831 Surgical operation with implant of artificial internal device as the cause of abnormal reaction of the patient, or of later complication, without mention of misadventure at the time of the procedure: Secondary | ICD-10-CM | POA: Diagnosis present

## 2019-10-14 DIAGNOSIS — I82412 Acute embolism and thrombosis of left femoral vein: Secondary | ICD-10-CM | POA: Diagnosis present

## 2019-10-14 DIAGNOSIS — R188 Other ascites: Secondary | ICD-10-CM | POA: Diagnosis not present

## 2019-10-14 DIAGNOSIS — E876 Hypokalemia: Secondary | ICD-10-CM | POA: Diagnosis present

## 2019-10-14 DIAGNOSIS — R1013 Epigastric pain: Secondary | ICD-10-CM | POA: Diagnosis not present

## 2019-10-14 DIAGNOSIS — R1032 Left lower quadrant pain: Secondary | ICD-10-CM | POA: Diagnosis not present

## 2019-10-14 DIAGNOSIS — R609 Edema, unspecified: Secondary | ICD-10-CM | POA: Diagnosis not present

## 2019-10-14 DIAGNOSIS — K317 Polyp of stomach and duodenum: Secondary | ICD-10-CM | POA: Diagnosis present

## 2019-10-14 DIAGNOSIS — R109 Unspecified abdominal pain: Secondary | ICD-10-CM | POA: Diagnosis not present

## 2019-10-14 DIAGNOSIS — K529 Noninfective gastroenteritis and colitis, unspecified: Secondary | ICD-10-CM | POA: Diagnosis not present

## 2019-10-14 DIAGNOSIS — R451 Restlessness and agitation: Secondary | ICD-10-CM | POA: Diagnosis not present

## 2019-10-14 DIAGNOSIS — Z880 Allergy status to penicillin: Secondary | ICD-10-CM | POA: Diagnosis not present

## 2019-10-14 DIAGNOSIS — A419 Sepsis, unspecified organism: Secondary | ICD-10-CM | POA: Diagnosis not present

## 2019-10-14 DIAGNOSIS — R0902 Hypoxemia: Secondary | ICD-10-CM | POA: Diagnosis not present

## 2019-10-14 DIAGNOSIS — Y95 Nosocomial condition: Secondary | ICD-10-CM | POA: Diagnosis not present

## 2019-10-14 DIAGNOSIS — T45515A Adverse effect of anticoagulants, initial encounter: Secondary | ICD-10-CM | POA: Diagnosis not present

## 2019-10-14 DIAGNOSIS — I82409 Acute embolism and thrombosis of unspecified deep veins of unspecified lower extremity: Secondary | ICD-10-CM | POA: Diagnosis not present

## 2019-10-14 DIAGNOSIS — J189 Pneumonia, unspecified organism: Secondary | ICD-10-CM | POA: Diagnosis not present

## 2019-10-14 DIAGNOSIS — G9340 Encephalopathy, unspecified: Secondary | ICD-10-CM | POA: Diagnosis not present

## 2019-10-14 DIAGNOSIS — I82402 Acute embolism and thrombosis of unspecified deep veins of left lower extremity: Secondary | ICD-10-CM | POA: Diagnosis not present

## 2019-10-14 DIAGNOSIS — R10819 Abdominal tenderness, unspecified site: Secondary | ICD-10-CM | POA: Diagnosis not present

## 2019-10-14 DIAGNOSIS — G9349 Other encephalopathy: Secondary | ICD-10-CM | POA: Diagnosis present

## 2019-10-14 DIAGNOSIS — R519 Headache, unspecified: Secondary | ICD-10-CM | POA: Diagnosis not present

## 2019-10-14 DIAGNOSIS — J9811 Atelectasis: Secondary | ICD-10-CM | POA: Diagnosis not present

## 2019-10-14 DIAGNOSIS — R1084 Generalized abdominal pain: Secondary | ICD-10-CM | POA: Diagnosis not present

## 2019-10-14 DIAGNOSIS — R291 Meningismus: Secondary | ICD-10-CM | POA: Diagnosis present

## 2019-10-14 DIAGNOSIS — Z982 Presence of cerebrospinal fluid drainage device: Secondary | ICD-10-CM | POA: Diagnosis not present

## 2019-10-14 DIAGNOSIS — G809 Cerebral palsy, unspecified: Secondary | ICD-10-CM | POA: Diagnosis present

## 2019-10-14 DIAGNOSIS — Z993 Dependence on wheelchair: Secondary | ICD-10-CM | POA: Diagnosis not present

## 2019-10-14 DIAGNOSIS — R000 Tachycardia, unspecified: Secondary | ICD-10-CM | POA: Diagnosis present

## 2019-10-14 LAB — BASIC METABOLIC PANEL
Anion gap: 11 (ref 5–15)
BUN: 7 mg/dL (ref 6–20)
CO2: 22 mmol/L (ref 22–32)
Calcium: 8 mg/dL — ABNORMAL LOW (ref 8.9–10.3)
Chloride: 107 mmol/L (ref 98–111)
Creatinine, Ser: 0.56 mg/dL (ref 0.44–1.00)
GFR calc Af Amer: >60 mL/min (ref >60)
GFR calc non Af Amer: >60 mL/min (ref >60)
Glucose, Bld: 103 mg/dL — ABNORMAL HIGH (ref 70–99)
Potassium: 3.8 mmol/L (ref 3.5–5.1)
Sodium: 140 mmol/L (ref 135–145)

## 2019-10-14 LAB — CBC
HCT: 36.6 % (ref 36.0–46.0)
Hemoglobin: 13.3 g/dL (ref 12.0–15.0)
MCH: 28.9 pg (ref 26.0–34.0)
MCHC: 36.3 g/dL — ABNORMAL HIGH (ref 30.0–36.0)
MCV: 79.6 fL — ABNORMAL LOW (ref 80.0–100.0)
Platelets: 194 10*3/uL (ref 150–400)
RBC: 4.6 MIL/uL (ref 3.87–5.11)
RDW: 13.4 % (ref 11.5–15.5)
WBC: 11.8 10*3/uL — ABNORMAL HIGH (ref 4.0–10.5)
nRBC: 0 % (ref 0.0–0.2)

## 2019-10-14 LAB — D-DIMER, QUANTITATIVE: D-Dimer, Quant: 1.11 ug/mL-FEU — ABNORMAL HIGH (ref 0.00–0.50)

## 2019-10-14 LAB — SARS CORONAVIRUS 2 (TAT 6-24 HRS): SARS Coronavirus 2: NEGATIVE

## 2019-10-14 LAB — LACTATE DEHYDROGENASE: LDH: 180 U/L (ref 98–192)

## 2019-10-14 LAB — SEDIMENTATION RATE: Sed Rate: 17 mm/hr (ref 0–22)

## 2019-10-14 LAB — C-REACTIVE PROTEIN: CRP: 12.7 mg/dL — ABNORMAL HIGH (ref <1.0)

## 2019-10-14 LAB — PROCALCITONIN: Procalcitonin: <0.1 ng/mL

## 2019-10-14 LAB — HIV ANTIBODY (ROUTINE TESTING W REFLEX): HIV Screen 4th Generation wRfx: NONREACTIVE

## 2019-10-14 MED ORDER — ENOXAPARIN SODIUM 80 MG/0.8ML ~~LOC~~ SOLN
75.0000 mg | Freq: Two times a day (BID) | SUBCUTANEOUS | Status: DC
  Administered 2019-10-14 – 2019-10-16 (×4): 75 mg via SUBCUTANEOUS
  Filled 2019-10-14 (×4): qty 0.8

## 2019-10-14 MED ORDER — ENOXAPARIN SODIUM 80 MG/0.8ML ~~LOC~~ SOLN
75.0000 mg | SUBCUTANEOUS | Status: AC
  Administered 2019-10-14: 75 mg via SUBCUTANEOUS
  Filled 2019-10-14: qty 0.8

## 2019-10-14 MED ORDER — MORPHINE SULFATE (PF) 2 MG/ML IV SOLN
2.0000 mg | Freq: Once | INTRAVENOUS | Status: AC
  Administered 2019-10-14: 2 mg via INTRAVENOUS
  Filled 2019-10-14: qty 1

## 2019-10-14 NOTE — Evaluation (Addendum)
Clinical/Bedside Swallow Evaluation Patient Details  Name: Tracey Graham MRN: [id number] Date of Birth: 10/26/1986  Today's Date: 10/14/2019 Time: SLP Start Time (ACUTE ONLY): 3976 SLP Stop Time (ACUTE ONLY): 0925 SLP Time Calculation (min) (ACUTE ONLY): 28 min  Past Medical History:  Past Medical History:  Diagnosis Date  . Hydrocephalus ([place])   . Seizures ([place])   . VP (ventriculoperitoneal) shunt status    Past Surgical History:  Past Surgical History:  Procedure Laterality Date  . CARDIAC SURGERY    . VENTRICULOPERITONEAL SHUNT     HPI:  Tracey Graham is a 33 y.o. female with medical history significant for cerebral palsy, wheelchair-bound and fully dependent with ADLs, hydrocephalus s/p VP shunt, seizure disorder, and intellectual disability who presents to the ED for evaluation of fevers and abdominal pain.  Per chart mother thinks that she has been having some nausea without emesis and has a history of dysphagia and does choke on food on occasion.  CXR Stable appearance of the chest, with no active cardiopulmonary disease identified   Assessment / Plan / Recommendation Clinical Impression  Mom present and endorses dysphagia marked by inadequate mastication with premature oral tranist, "audible sound" when swallowing over past 3-4 months and "coughing sometimes with meals" which can by typical in pt's with cerebral palsy. Dentition is intact and observation of oral-motor symmetry and strength at rest reveals mild deficits however not significant as would be expected (no saliva leakage present). She is moaning/crying, appears to be in pain and resistant initially to puree. Consumed 3 bites pudding and sips water via straw (delayed initiation suspect due decreased desire). Audible swallow not unusual and can be sign of discoordination in pt's with CP, No cough, throat clear and vocal quality clear. She does not have audible pharyngeal or chest congestion at baseline and clear CXR.  Results/recommendations discussed with pt's mom and MD - aspiration is not presently suspected however risk can he higher in this population. Recommend initiate Dys 1 (puree), thin liquids, crush meds, full assist after testing that requires NPO status. MD or nursing staff pleas ORDER. ST will follow up for safety and make necessary recommendations for instrumental assessment if neeeded.     SLP Visit Diagnosis: Dysphagia, unspecified (R13.10)    Aspiration Risk  Mild aspiration risk    Diet Recommendation Thin liquid;Dysphagia 1 (Puree)   Liquid Administration via: Straw Medication Administration: Crushed with puree Supervision: Staff to assist with self feeding;Full supervision/cueing for compensatory strategies Compensations: Minimize environmental distractions;Slow rate Postural Changes: Seated upright at 90 degrees    Other  Recommendations Oral Care Recommendations: Oral care BID   Follow up Recommendations Other (comment)(TBD)      Frequency and Duration min 2x/week  2 weeks       Prognosis Prognosis for Safe Diet Advancement: Good Barriers to Reach Goals: Cognitive deficits      Swallow Study   General HPI: Tracey Graham is a 33 y.o. female with medical history significant for cerebral palsy, wheelchair-bound and fully dependent with ADLs, hydrocephalus s/p VP shunt, seizure disorder, and intellectual disability who presents to the ED for evaluation of fevers and abdominal pain.  Per chart mother thinks that she has been having some nausea without emesis and has a history of dysphagia and does choke on food on occasion.  CXR Stable appearance of the chest, with no active cardiopulmonary disease identified Type of Study: Bedside Swallow Evaluation Previous Swallow Assessment: (none found) Diet Prior to this Study: NPO Temperature Spikes Noted: Yes Respiratory  Status: Room air History of Recent Intubation: No Behavior/Cognition: Alert;Other (Comment);Requires  cueing;Distractible(intermittently crying ) Oral Cavity Assessment: Other (comment)(unable for full view) Oral Care Completed by SLP: No Oral Cavity - Dentition: Adequate natural dentition Self-Feeding Abilities: Total assist Patient Positioning: Upright in bed Baseline Vocal Quality: Normal    Oral/Motor/Sensory Function Overall Oral Motor/Sensory Function: Other (comment)(for cerebral palsy appeared functional )   Ice Chips Ice chips: Not tested   Thin Liquid Thin Liquid: Within functional limits Presentation: Straw    Nectar Thick Nectar Thick Liquid: Not tested   Honey Thick Honey Thick Liquid: Not tested   Puree Puree: Within functional limits   Solid     Solid: Not tested      Royce Macadamia 10/14/2019,9:57 AM   Breck Coons Lon[place] Face.Ed Nurse, children's [phone] Office [phone] '

## 2019-10-14 NOTE — Progress Notes (Signed)
ANTICOAGULATION CONSULT NOTE - Initial Consult  Pharmacy Consult for Lovenox Indication: DVT  Allergies  Allergen Reactions  . Penicillins Swelling    Did not have reaction to 5d of amoxicillin Has patient had a PCN reaction causing immediate rash, facial/tongue/throat swelling, SOB or lightheadedness with hypotension: Yes Has patient had a PCN reaction causing severe rash involving mucus membranes or skin necrosis: No Has patient had a PCN reaction that required hospitalization No Has patient had a PCN reaction occurring within the last 10 years: No If all of the above answers are "NO", then may proceed with Cephalosporin use.     Patient Measurements: Height: 5\' 4"  (162.6 cm) Weight: 164 lb 10.9 oz (74.7 kg) IBW/kg (Calculated) : 54.7  Vital Signs: Temp: 99 F (37.2 C) (10/18 0757) Temp Source: Oral (10/18 0757) BP: 154/92 (10/18 0757) Pulse Rate: 108 (10/18 0757)  Labs: Recent Labs    10/13/19 1144 10/14/19 0638  HGB 15.4* 13.3  HCT 42.7 36.6  PLT 253 194  LABPROT 14.5  --   INR 1.1  --   CREATININE 0.61 0.56    Estimated Creatinine Clearance: 99 mL/min (by C-G formula based on SCr of 0.56 mg/dL).   Medical History: Past Medical History:  Diagnosis Date  . Hydrocephalus ([place])   . Seizures ([place])   . VP (ventriculoperitoneal) shunt status     Assessment: 64 YOF who presented on 10/17 with fevers/abdominal pain - now found to have an acute DVT. Pharmacy consulted to start full dose lovenox for anticoagulation.   Goal of Therapy:  Anti-Xa level 0.6-1 units/ml 4hrs after LMWH dose given Monitor platelets by anticoagulation protocol: Yes   Plan:  - Start Lovenox 75 mg SQ every 12 hours - Will monitor renal function for any necessary dose adjustments - Will monitor CBC and signs/symptoms of bleeding - Will follow-up on transition to oral [facility] with education  Thank you for allowing pharmacy to be a part of this patient's care.  Alycia Rossetti, PharmD,  BCPS Clinical Pharmacist Clinical phone for 10/14/2019: [phone] 10/14/2019 11:21 AM   **Pharmacist phone directory can now be found on [email or web address] ([email or web address]).  Listed under [place].    Lawson Radar 10/14/2019,11:19 AM

## 2019-10-14 NOTE — Progress Notes (Addendum)
PROGRESS NOTE    Tracey Graham  ZOX:[id number] DOB: 07/01/1986 DOA: 10/13/2019 PCP: Clinic, General Medical  Brief Narrative: Tracey Graham is a 33 y.o. female with medical history significant for cerebral palsy, wheelchair-bound and fully dependent with ADLs, hydrocephalus s/p VP shunt, seizure disorder, and intellectual disability who presents to the ED for evaluation of fevers and feeling unwell. -In emergency room was febrile to 101.8, white count was 11.3, rest of her electrolytes were unremarkable, urinalysis was negative, chest x-ray was unremarkable, COVID-19 PCR was negative -CT abdomen/pelvis without contrast showed trace lower abdominal pelvic free fluid per radiology may be physiologic or related to chronic VP shunt tubing.  No fluid collection, abscess, or acute intra-abdominal or pelvic finding seen. -VP shunt series showed visualized VP shunt catheter intact on skull, chest, and abdominal x-rays.  CT head without contrast showed a left frontal approach CSF shunt which appeared stable along with decompressed ventricles since 2014.  No acute intracranial abnormality noted.  Assessment & Plan:   Fever of unknown origin -Dopplers noted acute DVT in left lower extremity, suspect this may be the etiology of her fever -Start full dose anticoagulation and monitor -Called and discussed with infectious disease Dr. Lorenso Courier, he agreed with treating DVT and monitor for response to therapy, if fevers persist despite anticoagulation for couple of days may need to entertain other possibilities -Urinalysis is unremarkable, chest x-ray is benign including repeat which was done given history of dysphagia and risk of aspiration -COVID-19 PCR is negative, -CT abdomen pelvis without contrast was unremarkable, CT head was also benign -Poor p.o. intake due to fevers and dysphagia, will start IV fluids today and give phenobarbital on IV  Hydrocephalus s/p chronic VP shunt: VP shunt series showed  visualized VP shunt catheter intact on skull, chest, and abdominal x-rays.  CT head without contrast showed a left frontal approach CSF shunt which appeared stable along with decompressed ventricles since 2014.  As above, per EDP conversation with neurosurgery it was felt VP shunt was not likely contributing to presentation and tap not currently indicated. -See discussion above, plan to treat DVT with anticoagulation and monitor for response to therapy  Seizure disorder: Continue phenobarbital, converted to IV formulation  Dysphagia -Given cerebral palsy, SLP evaluation requested  DVT prophylaxis: Lovenox Code Status: Full code, confirmed with mother Family Communication: Discussed with mother at bedside Disposition Plan: Pending clinical progress  Consultants:   Case d/w NSG 10/17   Procedures:   Antimicrobials:    Subjective: -Appears uncomfortable, not eating and drinking much, febrile very early this morning  Objective: Vitals:   10/14/19 0145 10/14/19 0237 10/14/19 0536 10/14/19 0757  BP: (!) 162/101 (!) 150/84  (!) 154/92  Pulse: (!) 121 (!) 115 (!) 109 (!) 108  Resp: Temp:  (!) 101 F (38.3 C) 98.9 F (37.2 C) 99 F (37.2 C)  TempSrc:  Axillary Axillary Oral  SpO2: 94% 100% 97% 96%  Weight:  74.7 kg    Height:   (1.626 m)      Intake/Output Summary (Last 24 hours) at 10/14/2019 1152 Last data filed at 10/14/2019 0538 Gross per 24 hour  Intake --  Output 150 ml  Net -150 ml   Filed Weights   10/13/19 1003 10/13/19 2006 10/14/19 0237  Weight: 72.6 kg 74.4 kg 74.7 kg    Examination:  General exam: Chronically ill female, appears much older than stated age, nonverbal, moaning uncomfortable appearing Respiratory system: Clear bilaterally Cardiovascular system: S1 &  S2 heard, RRR.  Tachycardic  gastrointestinal system: Abdomen is nondistended, soft and nontender.Normal bowel sounds heard. Central nervous system: Nonverbal, some spastic  changes in lower legs, Extremities: Trace edema Skin: No rashes, lesions or ulcers Psychiatry: Unable to assess   Data Reviewed:   CBC: Recent Labs  Lab 10/13/19 1144 10/14/19 0638  WBC 11.4* 11.8*  NEUTROABS 9.4*  --   HGB 15.4* 13.3  HCT 42.7 36.6  MCV 80.7 79.6*  PLT 253 194   Basic Metabolic Panel: Recent Labs  Lab 10/13/19 1144 10/14/19 0638  NA 137 140  K 4.0 3.8  CL 101 107  CO2 22 22  GLUCOSE 108* 103*  BUN 5* 7  CREATININE 0.61 0.56  CALCIUM 9.0 8.0*   GFR: Estimated Creatinine Clearance: 99 mL/min (by C-G formula based on SCr of 0.56 mg/dL). Liver Function Tests: Recent Labs  Lab 10/13/19 1144  AST 17  ALT 20  ALKPHOS 116  BILITOT 0.5  PROT 7.6  ALBUMIN 4.0   No results for input(s): LIPASE, AMYLASE in the last 168 hours. No results for input(s): AMMONIA in the last 168 hours. Coagulation Profile: Recent Labs  Lab 10/13/19 1144  INR 1.1   Cardiac Enzymes: No results for input(s): CKTOTAL, CKMB, CKMBINDEX, TROPONINI in the last 168 hours. BNP (last 3 results) No results for input(s): PROBNP in the last 8760 hours. HbA1C: No results for input(s): HGBA1C in the last 72 hours. CBG: No results for input(s): GLUCAP in the last 168 hours. Lipid Profile: No results for input(s): CHOL, HDL, LDLCALC, TRIG, CHOLHDL, LDLDIRECT in the last 72 hours. Thyroid Function Tests: No results for input(s): TSH, T4TOTAL, FREET4, T3FREE, THYROIDAB in the last 72 hours. Anemia Panel: No results for input(s): VITAMINB12, FOLATE, FERRITIN, TIBC, IRON, RETICCTPCT in the last 72 hours. Urine analysis:    Component Value Date/Time   COLORURINE YELLOW 10/13/2019 1710   APPEARANCEUR HAZY (A) 10/13/2019 1710   LABSPEC 1.029 10/13/2019 1710   PHURINE 5.0 10/13/2019 1710   GLUCOSEU NEGATIVE 10/13/2019 1710   HGBUR NEGATIVE 10/13/2019 1710   BILIRUBINUR NEGATIVE 10/13/2019 1710   KETONESUR 80 (A) 10/13/2019 1710   PROTEINUR 30 (A) 10/13/2019 1710   UROBILINOGEN  1.0 01/05/2013 2116   NITRITE NEGATIVE 10/13/2019 1710   LEUKOCYTESUR NEGATIVE 10/13/2019 1710   Sepsis Labs: (procalcitonin:4,lacticidven:4)  ) Recent Results (from the past 240 hour(s))  Culture, blood (Routine x 2)     Status: None (Preliminary result)   Collection Time: 10/13/19  8:55 AM   Specimen: BLOOD  Result Value Ref Range Status   Specimen Description BLOOD LEFT ANTECUBITAL  Final   Special Requests   Final    BOTTLES DRAWN AEROBIC ONLY Blood Culture adequate volume   Culture   Final    NO GROWTH < 24 HOURS Performed at [facility], [address] [place], [place] [address]    Report Status PENDING  Incomplete  Culture, blood (Routine x 2)     Status: None (Preliminary result)   Collection Time: 10/13/19 11:44 AM   Specimen: BLOOD  Result Value Ref Range Status   Specimen Description BLOOD RIGHT ANTECUBITAL  Final   Special Requests   Final    BOTTLES DRAWN AEROBIC AND ANAEROBIC Blood Culture results may not be optimal due to an inadequate volume of blood received in culture bottles   Culture   Final    NO GROWTH < 24 HOURS Performed at [facility], [address], [place], [place]  [address]    Report Status PENDING  Incomplete  SARS CORONAVIRUS 2 (TAT 6-24 HRS) Nasopharyngeal Nasopharyngeal Swab     Status: None   Collection Time: 10/13/19  8:05 PM   Specimen: Nasopharyngeal Swab  Result Value Ref Range Status   SARS Coronavirus 2 NEGATIVE NEGATIVE Final    Comment: (NOTE) SARS-CoV-2 target nucleic acids are NOT DETECTED. The SARS-CoV-2 RNA is generally detectable in upper and lower respiratory specimens during the acute phase of infection. Negative results do not preclude SARS-CoV-2 infection, do not rule out co-infections with other pathogens, and should not be used as the sole basis for treatment or other patient management decisions. Negative results must be combined with clinical observations, patient history, and epidemiological  information. The expected result is Negative. Fact Sheet for Patients: HairSlick.no Fact Sheet for Healthcare Providers: quierodirigir.com This test is not yet approved or cleared by the [place] FDA and  has been authorized for detection and/or diagnosis of SARS-CoV-2 by FDA under an Emergency Use Authorization (EUA). This EUA will remain  in effect (meaning this test can be used) for the duration of the COVID-19 declaration under Section 56 4(b)(1) of the Act, 21 U.S.C. section 360bbb-3(b)(1), unless the authorization is terminated or revoked sooner. Performed at [facility], [address] [place], [place] [address]          Radiology Studies: Ct Abdomen Pelvis Wo Contrast  Result Date: 10/13/2019 CLINICAL DATA:  Generalized abdominal pain with nausea and fever EXAM: CT ABDOMEN AND PELVIS WITHOUT CONTRAST TECHNIQUE: Multidetector CT imaging of the abdomen and pelvis was performed following the standard protocol without IV contrast. COMPARISON:  01/06/2013 FINDINGS: Lower chest: No acute abnormality. Hepatobiliary: Limited without IV contrast. No large focal hepatic abnormality or intrahepatic biliary dilatation. Gallbladder and biliary system unremarkable. Common bile duct nondilated. Pancreas: Unremarkable. No pancreatic ductal dilatation or surrounding inflammatory changes. Spleen: Normal in size without focal abnormality. Adrenals/Urinary Tract: Adrenal glands are unremarkable. Kidneys are normal, without renal calculi, focal lesion, or hydronephrosis. Bladder is unremarkable. Stomach/Bowel: Negative for bowel obstruction, significant dilatation, ileus, or free air. Appendix is unremarkable and retrocecal in position. No acute inflammatory process, fluid collection, abscess, hemorrhage or hematoma Vascular/Lymphatic: Limited without IV contrast. No aneurysm. No adenopathy. Reproductive: Uterus and adnexa normal in  size. Trace pelvic free fluid may be physiologic or related to the chronic VP shunt tubing. Other: No inguinal or abdominal wall hernia. Musculoskeletal: Degenerative changes of the spine. IMPRESSION: No acute intra-abdominal or pelvic finding by noncontrast CT. Trace lower abdominopelvic free fluid may be physiologic or related to the chronic VP shunt tubing. No fluid collection or abscess. Electronically Signed   By: Judie Petit.  Shick M.D.   On: 10/13/2019 12:40   Dg Skull 1-3 Views  Result Date: 10/13/2019 CLINICAL DATA:  33 year old female with fever and right-sided abdominal pain since yesterday. Evaluate for shunt malfunction. EXAM: SKULL - 1-3 VIEW COMPARISON:  No priors. FINDINGS: Left-sided ventriculoperitoneal shunt catheter noted, visualized portions of which are intact. IMPRESSION: 1. Visualized ventriculoperitoneal shunt catheter is intact. Electronically Signed   By: Trudie Reed M.D.   On: 10/13/2019 18:30   Dg Chest 1 View  Result Date: 10/13/2019 CLINICAL DATA:  33 year old female with history of right-sided abdominal pain since yesterday. Evaluate for potential shunt malfunction. EXAM: CHEST  1 VIEW COMPARISON:  Chest x-ray 10/13/2019. FINDINGS: The heart size and mediastinal contours are within normal limits. Both lungs are clear. The visualized skeletal structures are unremarkable. Surgical clips in  the left upper mediastinum. Ventriculoperitoneal shunt catheter projecting over the left hemithorax and upper left abdomen, which appears intact without obvious shunt discontinuities or kinks. IMPRESSION: 1. Ventriculoperitoneal shunt catheter as visualized appears intact. 2. No radiographic evidence of acute cardiopulmonary disease. Electronically Signed   By: Trudie Reed M.D.   On: 10/13/2019 18:22   Dg Chest 2 View  Result Date: 10/14/2019 CLINICAL DATA:  Initial evaluation for fever of unknown origin. EXAM: CHEST - 2 VIEW COMPARISON:  Prior radiograph from 10/13/2019. FINDINGS:  Cardiac and mediastinal silhouettes are stable, and remain within normal limits. Surgical clip overlies the upper left mediastinum. Lungs normally inflated. No focal infiltrates. No edema or effusion. No pneumothorax. VP shunt catheter overlies the left chest, stable. Osseous structures are unchanged. IMPRESSION: Stable appearance of the chest, with no active cardiopulmonary disease identified. Electronically Signed   By: Rise Mu M.D.   On: 10/14/2019 07:28   Dg Chest 2 View  Result Date: 10/13/2019 CLINICAL DATA:  Fever and right-sided abdominal pain since yesterday. EXAM: CHEST - 2 VIEW COMPARISON:  01/09/2016. 06/25/2015. FINDINGS: Heart size is normal. Ductus clip present within the mediastinum. The lungs are clear. The vascularity is normal. No effusions. VP shunt overlies the left chest. IMPRESSION: No active disease. Electronically Signed   By: Paulina Fusi M.D.   On: 10/13/2019 09:38   Dg Abd 1 View  Result Date: 10/13/2019 CLINICAL DATA:  33 year old female.  Evaluate for shunt malfunction. EXAM: ABDOMEN - 1 VIEW COMPARISON:  Abdominal radiograph 01/09/2016. FINDINGS: Ventriculoperitoneal shunt catheter seen projecting over the left side of the abdomen, coiled into the mid lower abdomen and upper pelvis. Visualized portions of the catheter appear intact, although there appears to be extra catheter fragments in the low abdomen and upper pelvis. Gas and stool is noted throughout the colon extending to the level of the distal rectum. No definite pathologic dilatation of small bowel. IMPRESSION: 1. Ventriculoperitoneal shunt catheter appears intact. 2. Residual catheter fragment in the lower abdomen, similar to prior examinations. 3. Nonobstructive bowel gas pattern. Electronically Signed   By: Trudie Reed M.D.   On: 10/13/2019 18:28   Ct Head Wo Contrast  Result Date: 10/13/2019 CLINICAL DATA:  33 year old female with unexplained altered mental status. EXAM: CT HEAD WITHOUT  CONTRAST TECHNIQUE: Contiguous axial images were obtained from the base of the skull through the vertex without intravenous contrast. COMPARISON:  [facility] head CT 01/05/2013. FINDINGS: Brain: Scaphocephaly. Dysgenesis of the corpus callosum. Mildly dysplastic appearing ventricles which remain decompressed since 2014 in the setting of a left superior frontal approach CSF shunt. Parietal and occipital lobe chronic volume loss. Relatively normal appearing posterior fossa. No midline shift, ventriculomegaly, mass effect, evidence of mass lesion, intracranial hemorrhage or evidence of cortically based acute infarction. Vascular: No suspicious intracranial vascular hyperdensity. Skull: Chronic superior frontal burr holes. No acute osseous abnormality identified. Sinuses/Orbits: Visualized paranasal sinuses and mastoids are stable and well pneumatized. Other: Left convexity CSF reservoir with shunt tubing continuing caudally in the left neck. Partial calcification of the catheter beginning in the left suboccipital region, but no adverse features. Chronic postoperative changes to the contralateral right scalp. Rightward gaze deviation. IMPRESSION: 1. No acute intracranial abnormality. 2. Left frontal approach CSF shunt appears stable along with decompressed ventricles since 2014. 3. Underlying dysgenesis of the corpus callosum, scaphocephaly, chronic parieto-occipital cerebral volume loss. Electronically Signed   By: Odessa Fleming M.D.   On: 10/13/2019 18:40   Vas [place] Lower Extremity Venous (dvt)  Result Date: 10/14/2019  Lower Venous Study Indications: Fever, elevated D-Dimer.  Risk Factors: Cerebal Palsy, wheelchair bound, abdominal pain. Limitations: Body habitus and edema, depth of vessels. Comparison Study: No prior study on file Performing Technologist: Sherren Kerns RVS  Examination Guidelines: A complete evaluation includes B-mode imaging, spectral Doppler, color Doppler, and power Doppler as needed of  all accessible portions of each vessel. Bilateral testing is considered an integral part of a complete examination. Limited examinations for reoccurring indications may be performed as noted.  +---------+---------------+---------+-----------+----------+-------------------+  RIGHT     Compressibility Phasicity Spontaneity Properties Thrombus Aging       +---------+---------------+---------+-----------+----------+-------------------+  CFV       Full            Yes       Yes                                         +---------+---------------+---------+-----------+----------+-------------------+  SFJ       Full                                                                  +---------+---------------+---------+-----------+----------+-------------------+  FV Prox   Full                                                                  +---------+---------------+---------+-----------+----------+-------------------+  FV Mid    Full                                                                  +---------+---------------+---------+-----------+----------+-------------------+  FV Distal Full                                                                  +---------+---------------+---------+-----------+----------+-------------------+  PFV       Full                                                                  +---------+---------------+---------+-----------+----------+-------------------+  POP                                                        Patent  by color and                                                              Doppler              +---------+---------------+---------+-----------+----------+-------------------+  PERO                                                       Not visualized       +---------+---------------+---------+-----------+----------+-------------------+   +---------+---------------+---------+-----------+----------+-------------------+  LEFT       Compressibility Phasicity Spontaneity Properties Thrombus Aging       +---------+---------------+---------+-----------+----------+-------------------+  CFV       Full            Yes       Yes                                         +---------+---------------+---------+-----------+----------+-------------------+  SFJ       Full                                                                  +---------+---------------+---------+-----------+----------+-------------------+  FV Prox   Full                                                                  +---------+---------------+---------+-----------+----------+-------------------+  FV Mid    Full                                                                  +---------+---------------+---------+-----------+----------+-------------------+  FV Distal Full                                                                  +---------+---------------+---------+-----------+----------+-------------------+  PFV       Full                                                                  +---------+---------------+---------+-----------+----------+-------------------+  POP                                                        Patent by color and                                                              Dopple               +---------+---------------+---------+-----------+----------+-------------------+  PTV       None                                             Acute                +---------+---------------+---------+-----------+----------+-------------------+  PERO      None                                             Acute                +---------+---------------+---------+-----------+----------+-------------------+     Summary: Right: There is no evidence of deep vein thrombosis in the lower extremity. However, portions of this examination were limited- see technologist comments above. Left: Findings consistent with acute deep vein thrombosis involving the left  posterior tibial veins, and left peroneal veins.  *See table(s) above for measurements and observations.    Preliminary         Scheduled Meds:  enoxaparin (LOVENOX) injection  75 mg Subcutaneous Q12H   PHENObarbital  97.2 mg Intravenous QHS   Continuous Infusions:   LOS: 0 days    Time spent: 35min    Tracey CovePreetha Emeric Novinger, MD [facility]  10/14/2019, 11:52 AM

## 2019-10-14 NOTE — Progress Notes (Signed)
VASCULAR LAB PRELIMINARY  PRELIMINARY  PRELIMINARY  PRELIMINARY  Bilateral lower extremity venous duplex completed.    Preliminary report:  See CV proc for preliminary results.  Janifer Gieselman, RVT 10/14/2019, 11:11 AM

## 2019-10-15 MED ORDER — POLYETHYLENE GLYCOL 3350 17 G PO PACK
17.0000 g | PACK | Freq: Two times a day (BID) | ORAL | Status: DC
  Administered 2019-10-15 – 2019-10-23 (×11): 17 g via ORAL
  Filled 2019-10-15 (×15): qty 1

## 2019-10-15 MED ORDER — BISACODYL 10 MG RE SUPP
10.0000 mg | Freq: Once | RECTAL | Status: AC
  Administered 2019-10-15: 10:00:00 10 mg via RECTAL
  Filled 2019-10-15: qty 1

## 2019-10-15 NOTE — Progress Notes (Signed)
  Speech Language Pathology  Patient Details Name: Tracey Graham MRN: [id number] DOB: [birth year] Today's Date: 10/15/2019 Time:  -     (No charge) Tracey Graham was resting comfortably. Therapist spoke with pt's mom for about one minute re: status. Mom states that she "ate a little bit more today" and seems to be is less discomfort. She denied s/s aspiration. Do not suspect pt is aspirating. Decreased intake likely related to recent fever, DVT and abdominal pain.        Tracey Graham 10/15/2019, 4:10 PM  [name] Colvin Caroli.Ed Risk analyst [phone] Office [phone]

## 2019-10-15 NOTE — Progress Notes (Addendum)
PROGRESS NOTE    Tracey Graham  WRU:[id number] DOB: October 23, 1986 DOA: 10/13/2019 PCP: Clinic, General Medical  Brief Narrative: Tracey Graham is a 33 y.o. female with medical history significant for cerebral palsy, wheelchair-bound and fully dependent with ADLs, hydrocephalus s/p VP shunt, seizure disorder, and intellectual disability who presents to the ED for evaluation of fevers and feeling unwell. -In emergency room was febrile to 101.8, white count was 11.3, rest of her electrolytes were unremarkable, urinalysis was negative, chest x-ray was unremarkable, COVID-19 PCR was negative -CT abdomen/pelvis without contrast showed trace lower abdominal pelvic free fluid per radiology may be physiologic or related to chronic VP shunt tubing.  No fluid collection, abscess, or acute intra-abdominal or pelvic finding seen. -VP shunt series showed visualized VP shunt catheter intact on skull, chest, and abdominal x-rays.  CT head without contrast showed a left frontal approach CSF shunt which appeared stable along with decompressed ventricles since 2014.  No acute intracranial abnormality noted. -Subsequent work-up noted left leg DVT, started anticoagulation  Assessment & Plan:  Fevers, acute DVT -Dopplers noted acute DVTs in left lower extremity, this is suspected to be the etiology of her fevers, started full dose anticoagulation with Lovenox yesterday  -Called and discussed with infectious disease Dr. Lorenso Courier 10/18, he agreed with treating DVT and monitor for response to therapy, if fevers persist despite anticoagulation for couple of days may need to entertain other possibilities  -Urinalysis is unremarkable, chest x-ray is benign including repeat, procalcitonin less than 0.1 arguing against bacterial infection -COVID-19 PCR is negative, -CT abdomen pelvis without contrast was unremarkable, CT head was also benign -Right upper quadrant ultrasound also benign, fever curve appeared to be improving  but febrile again last night, monitor  Hydrocephalus s/p chronic VP shunt: VP shunt series showed visualized VP shunt catheter intact on imaging,.  CT head without contrast showed a left frontal approach CSF shunt which appeared stable along with decompressed ventricles since 2014.  As above, per EDP conversation with neurosurgery it was felt VP shunt was not likely contributing to presentation and tap not currently indicated. -See discussion above, plan to treat DVT with anticoagulation and monitor for response to therapy  Seizure disorder: Continue phenobarbital, converted to IV formulation  Dysphagia -Given cerebral palsy, SLP following, continue dysphagia diet  Constipation -Add MiraLAX and Dulcolax suppository  DVT prophylaxis: Lovenox Code Status: Full code, confirmed with mother Family Communication: Discussed with mother at bedside Disposition Plan: Pending clinical progress  Consultants:   Case d/w NSG 10/17   Procedures:   Antimicrobials:    Subjective: -Fevers appeared to be improving did have a temp last night, mild improvement in discomfort per mother, ate a little bit better yesterday  Objective: Vitals:   10/14/19 2115 10/14/19 2314 10/15/19 0335 10/15/19 1009  BP:  (!) 139/54 (!) 149/86 138/87  Pulse:  (!) 102 (!) 105 97  Resp:  Temp: (!) 101.6 F (38.7 C) 99.4 F (37.4 C) 99.6 F (37.6 C) 100.3 F (37.9 C)  TempSrc: Axillary Oral Oral Axillary  SpO2:  98% 98% 100%  Weight:      Height:        Intake/Output Summary (Last 24 hours) at 10/15/2019 1130 Last data filed at 10/15/2019 1100 Gross per 24 hour  Intake 240 ml  Output 350 ml  Net -110 ml   Filed Weights   10/13/19 1003 10/13/19 2006 10/14/19 0237  Weight: 72.6 kg 74.4 kg 74.7 kg    Examination:  Gen:  Chronically ill female, appears much older than stated age, nonverbal, moaning but appears more comfortable than yesterday HEENT: PERRLA, Neck supple, no JVD Lungs:  Decreased breath sounds at bases  CVS: RRR,No Gallops,Rubs or new Murmurs Abd: soft, Non tender, non distended, BS present Extremities: Trace edema Skin: no new rashes Central nervous system: Nonverbal, some spastic changes in lower legs, Psychiatry: Unable to assess   Data Reviewed:   CBC: Recent Labs  Lab 10/13/19 1144 10/14/19 0638  WBC 11.4* 11.8*  NEUTROABS 9.4*  --   HGB 15.4* 13.3  HCT 42.7 36.6  MCV 80.7 79.6*  PLT 253 194   Basic Metabolic Panel: Recent Labs  Lab 10/13/19 1144 10/14/19 0638  NA 137 140  K 4.0 3.8  CL 101 107  CO2 22 22  GLUCOSE 108* 103*  BUN 5* 7  CREATININE 0.61 0.56  CALCIUM 9.0 8.0*   GFR: Estimated Creatinine Clearance: 99 mL/min (by C-G formula based on SCr of 0.56 mg/dL). Liver Function Tests: Recent Labs  Lab 10/13/19 1144  AST 17  ALT 20  ALKPHOS 116  BILITOT 0.5  PROT 7.6  ALBUMIN 4.0   No results for input(s): LIPASE, AMYLASE in the last 168 hours. No results for input(s): AMMONIA in the last 168 hours. Coagulation Profile: Recent Labs  Lab 10/13/19 1144  INR 1.1   Cardiac Enzymes: No results for input(s): CKTOTAL, CKMB, CKMBINDEX, TROPONINI in the last 168 hours. BNP (last 3 results) No results for input(s): PROBNP in the last 8760 hours. HbA1C: No results for input(s): HGBA1C in the last 72 hours. CBG: No results for input(s): GLUCAP in the last 168 hours. Lipid Profile: No results for input(s): CHOL, HDL, LDLCALC, TRIG, CHOLHDL, LDLDIRECT in the last 72 hours. Thyroid Function Tests: No results for input(s): TSH, T4TOTAL, FREET4, T3FREE, THYROIDAB in the last 72 hours. Anemia Panel: No results for input(s): VITAMINB12, FOLATE, FERRITIN, TIBC, IRON, RETICCTPCT in the last 72 hours. Urine analysis:    Component Value Date/Time   COLORURINE YELLOW 10/13/2019 1710   APPEARANCEUR HAZY (A) 10/13/2019 1710   LABSPEC 1.029 10/13/2019 1710   PHURINE 5.0 10/13/2019 1710   GLUCOSEU NEGATIVE 10/13/2019 1710    HGBUR NEGATIVE 10/13/2019 1710   BILIRUBINUR NEGATIVE 10/13/2019 1710   KETONESUR 80 (A) 10/13/2019 1710   PROTEINUR 30 (A) 10/13/2019 1710   UROBILINOGEN 1.0 01/05/2013 2116   NITRITE NEGATIVE 10/13/2019 1710   LEUKOCYTESUR NEGATIVE 10/13/2019 1710   Sepsis Labs: @LABRCNTIP (procalcitonin:4,lacticidven:4)  ) Recent Results (from the past 240 hour(s))  Culture, blood (Routine x 2)     Status: None (Preliminary result)   Collection Time: 10/13/19  8:55 AM   Specimen: BLOOD  Result Value Ref Range Status   Specimen Description BLOOD LEFT ANTECUBITAL  Final   Special Requests   Final    BOTTLES DRAWN AEROBIC ONLY Blood Culture adequate volume   Culture   Final    NO GROWTH 2 DAYS Performed at [facility], [address] [place], [place] [address]    Report Status PENDING  Incomplete  Culture, blood (Routine x 2)     Status: None (Preliminary result)   Collection Time: 10/13/19 11:44 AM   Specimen: BLOOD  Result Value Ref Range Status   Specimen Description BLOOD RIGHT ANTECUBITAL  Final   Special Requests   Final    BOTTLES DRAWN AEROBIC AND ANAEROBIC Blood Culture results may not be optimal due to an inadequate volume of blood received in culture bottles  Culture   Final    NO GROWTH 2 DAYS Performed at [facility], [address], [place], [place] [address]    Report Status PENDING  Incomplete  SARS CORONAVIRUS 2 (TAT 6-24 HRS) Nasopharyngeal Nasopharyngeal Swab     Status: None   Collection Time: 10/13/19  8:05 PM   Specimen: Nasopharyngeal Swab  Result Value Ref Range Status   SARS Coronavirus 2 NEGATIVE NEGATIVE Final    Comment: (NOTE) SARS-CoV-2 target nucleic acids are NOT DETECTED. The SARS-CoV-2 RNA is generally detectable in upper and lower respiratory specimens during the acute phase of infection. Negative results do not preclude SARS-CoV-2 infection, do not rule out co-infections with other pathogens, and should not be used as the sole basis  for treatment or other patient management decisions. Negative results must be combined with clinical observations, patient history, and epidemiological information. The expected result is Negative. Fact Sheet for Patients: HairSlick.no Fact Sheet for Healthcare Providers: quierodirigir.com This test is not yet approved or cleared by the [place] FDA and  has been authorized for detection and/or diagnosis of SARS-CoV-2 by FDA under an Emergency Use Authorization (EUA). This EUA will remain  in effect (meaning this test can be used) for the duration of the COVID-19 declaration under Section 56 4(b)(1) of the Act, 21 U.S.C. section 360bbb-3(b)(1), unless the authorization is terminated or revoked sooner. Performed at [facility], [address], [place], [place] [address]          Radiology Studies: Ct Abdomen Pelvis Wo Contrast  Result Date: 10/13/2019 CLINICAL DATA:  Generalized abdominal pain with nausea and fever EXAM: CT ABDOMEN AND PELVIS WITHOUT CONTRAST TECHNIQUE: Multidetector CT imaging of the abdomen and pelvis was performed following the standard protocol without IV contrast. COMPARISON:  01/06/2013 FINDINGS: Lower chest: No acute abnormality. Hepatobiliary: Limited without IV contrast. No large focal hepatic abnormality or intrahepatic biliary dilatation. Gallbladder and biliary system unremarkable. Common bile duct nondilated. Pancreas: Unremarkable. No pancreatic ductal dilatation or surrounding inflammatory changes. Spleen: Normal in size without focal abnormality. Adrenals/Urinary Tract: Adrenal glands are unremarkable. Kidneys are normal, without renal calculi, focal lesion, or hydronephrosis. Bladder is unremarkable. Stomach/Bowel: Negative for bowel obstruction, significant dilatation, ileus, or free air. Appendix is unremarkable and retrocecal in position. No acute inflammatory process, fluid collection,  abscess, hemorrhage or hematoma Vascular/Lymphatic: Limited without IV contrast. No aneurysm. No adenopathy. Reproductive: Uterus and adnexa normal in size. Trace pelvic free fluid may be physiologic or related to the chronic VP shunt tubing. Other: No inguinal or abdominal wall hernia. Musculoskeletal: Degenerative changes of the spine. IMPRESSION: No acute intra-abdominal or pelvic finding by noncontrast CT. Trace lower abdominopelvic free fluid may be physiologic or related to the chronic VP shunt tubing. No fluid collection or abscess. Electronically Signed   By: Judie Petit.  Shick M.D.   On: 10/13/2019 12:40   Dg Skull 1-3 Views  Result Date: 10/13/2019 CLINICAL DATA:  33 year old female with fever and right-sided abdominal pain since yesterday. Evaluate for shunt malfunction. EXAM: SKULL - 1-3 VIEW COMPARISON:  No priors. FINDINGS: Left-sided ventriculoperitoneal shunt catheter noted, visualized portions of which are intact. IMPRESSION: 1. Visualized ventriculoperitoneal shunt catheter is intact. Electronically Signed   By: Trudie Reed M.D.   On: 10/13/2019 18:30   Dg Chest 1 View  Result Date: 10/13/2019 CLINICAL DATA:  33 year old female with history of right-sided abdominal pain since yesterday. Evaluate for potential shunt malfunction. EXAM: CHEST  1 VIEW COMPARISON:  Chest x-ray 10/13/2019. FINDINGS:  The heart size and mediastinal contours are within normal limits. Both lungs are clear. The visualized skeletal structures are unremarkable. Surgical clips in the left upper mediastinum. Ventriculoperitoneal shunt catheter projecting over the left hemithorax and upper left abdomen, which appears intact without obvious shunt discontinuities or kinks. IMPRESSION: 1. Ventriculoperitoneal shunt catheter as visualized appears intact. 2. No radiographic evidence of acute cardiopulmonary disease. Electronically Signed   By: Trudie Reed M.D.   On: 10/13/2019 18:22   Dg Chest 2 View  Result Date:  10/14/2019 CLINICAL DATA:  Initial evaluation for fever of unknown origin. EXAM: CHEST - 2 VIEW COMPARISON:  Prior radiograph from 10/13/2019. FINDINGS: Cardiac and mediastinal silhouettes are stable, and remain within normal limits. Surgical clip overlies the upper left mediastinum. Lungs normally inflated. No focal infiltrates. No edema or effusion. No pneumothorax. VP shunt catheter overlies the left chest, stable. Osseous structures are unchanged. IMPRESSION: Stable appearance of the chest, with no active cardiopulmonary disease identified. Electronically Signed   By: Rise Mu M.D.   On: 10/14/2019 07:28   Dg Abd 1 View  Result Date: 10/13/2019 CLINICAL DATA:  34 year old female.  Evaluate for shunt malfunction. EXAM: ABDOMEN - 1 VIEW COMPARISON:  Abdominal radiograph 01/09/2016. FINDINGS: Ventriculoperitoneal shunt catheter seen projecting over the left side of the abdomen, coiled into the mid lower abdomen and upper pelvis. Visualized portions of the catheter appear intact, although there appears to be extra catheter fragments in the low abdomen and upper pelvis. Gas and stool is noted throughout the colon extending to the level of the distal rectum. No definite pathologic dilatation of small bowel. IMPRESSION: 1. Ventriculoperitoneal shunt catheter appears intact. 2. Residual catheter fragment in the lower abdomen, similar to prior examinations. 3. Nonobstructive bowel gas pattern. Electronically Signed   By: Trudie Reed M.D.   On: 10/13/2019 18:28   Ct Head Wo Contrast  Result Date: 10/13/2019 CLINICAL DATA:  33 year old female with unexplained altered mental status. EXAM: CT HEAD WITHOUT CONTRAST TECHNIQUE: Contiguous axial images were obtained from the base of the skull through the vertex without intravenous contrast. COMPARISON:  [facility] head CT 01/05/2013. FINDINGS: Brain: Scaphocephaly. Dysgenesis of the corpus callosum. Mildly dysplastic appearing ventricles  which remain decompressed since 2014 in the setting of a left superior frontal approach CSF shunt. Parietal and occipital lobe chronic volume loss. Relatively normal appearing posterior fossa. No midline shift, ventriculomegaly, mass effect, evidence of mass lesion, intracranial hemorrhage or evidence of cortically based acute infarction. Vascular: No suspicious intracranial vascular hyperdensity. Skull: Chronic superior frontal burr holes. No acute osseous abnormality identified. Sinuses/Orbits: Visualized paranasal sinuses and mastoids are stable and well pneumatized. Other: Left convexity CSF reservoir with shunt tubing continuing caudally in the left neck. Partial calcification of the catheter beginning in the left suboccipital region, but no adverse features. Chronic postoperative changes to the contralateral right scalp. Rightward gaze deviation. IMPRESSION: 1. No acute intracranial abnormality. 2. Left frontal approach CSF shunt appears stable along with decompressed ventricles since 2014. 3. Underlying dysgenesis of the corpus callosum, scaphocephaly, chronic parieto-occipital cerebral volume loss. Electronically Signed   By: Odessa Fleming M.D.   On: 10/13/2019 18:40   Vas [place] Lower Extremity Venous (dvt)  Result Date: 10/14/2019  Lower Venous Study Indications: Fever, elevated D-Dimer.  Risk Factors: Cerebal Palsy, wheelchair bound, abdominal pain. Limitations: Body habitus and edema, depth of vessels. Comparison Study: No prior study on file Performing Technologist: Sherren Kerns RVS  Examination Guidelines: A complete evaluation includes B-mode imaging, spectral  Doppler, color Doppler, and power Doppler as needed of all accessible portions of each vessel. Bilateral testing is considered an integral part of a complete examination. Limited examinations for reoccurring indications may be performed as noted.  +---------+---------------+---------+-----------+----------+-------------------+  RIGHT      Compressibility Phasicity Spontaneity Properties Thrombus Aging       +---------+---------------+---------+-----------+----------+-------------------+  CFV       Full            Yes       Yes                                         +---------+---------------+---------+-----------+----------+-------------------+  SFJ       Full                                                                  +---------+---------------+---------+-----------+----------+-------------------+  FV Prox   Full                                                                  +---------+---------------+---------+-----------+----------+-------------------+  FV Mid    Full                                                                  +---------+---------------+---------+-----------+----------+-------------------+  FV Distal Full                                                                  +---------+---------------+---------+-----------+----------+-------------------+  PFV       Full                                                                  +---------+---------------+---------+-----------+----------+-------------------+  POP                                                        Patent by color and  Doppler              +---------+---------------+---------+-----------+----------+-------------------+  PERO                                                       Not visualized       +---------+---------------+---------+-----------+----------+-------------------+   +---------+---------------+---------+-----------+----------+-------------------+  LEFT      Compressibility Phasicity Spontaneity Properties Thrombus Aging       +---------+---------------+---------+-----------+----------+-------------------+  CFV       Full            Yes       Yes                                         +---------+---------------+---------+-----------+----------+-------------------+  SFJ        Full                                                                  +---------+---------------+---------+-----------+----------+-------------------+  FV Prox   Full                                                                  +---------+---------------+---------+-----------+----------+-------------------+  FV Mid    Full                                                                  +---------+---------------+---------+-----------+----------+-------------------+  FV Distal Full                                                                  +---------+---------------+---------+-----------+----------+-------------------+  PFV       Full                                                                  +---------+---------------+---------+-----------+----------+-------------------+  POP                                                        Patent by color and  Dopple               +---------+---------------+---------+-----------+----------+-------------------+  PTV       None                                             Acute                +---------+---------------+---------+-----------+----------+-------------------+  PERO      None                                             Acute                +---------+---------------+---------+-----------+----------+-------------------+     Summary: Right: There is no evidence of deep vein thrombosis in the lower extremity. However, portions of this examination were limited- see technologist comments above. Left: Findings consistent with acute deep vein thrombosis involving the left posterior tibial veins, and left peroneal veins.  *See table(s) above for measurements and observations. Electronically signed by Harold Barban MD on 10/14/2019 at 4:36:11 PM.    Final    US Abdomen Limited Ruq  Result Date: 10/14/2019 CLINICAL DATA:  Abdominal pain x2 days EXAM: ULTRASOUND ABDOMEN LIMITED RIGHT UPPER  QUADRANT COMPARISON:  None. FINDINGS: Gallbladder: No gallstones, gallbladder wall thickening, or pericholecystic fluid. Negative sonographic Murphy's sign. Common bile duct: Diameter: 4 mm Liver: Poorly visualized. No focal lesion identified. Within normal limits in parenchymal echogenicity. Portal vein is patent on color Doppler imaging with normal direction of blood flow towards the liver. Other: None. IMPRESSION: Negative right upper quadrant ultrasound. Electronically Signed   By: Julian Hy M.D.   On: 10/14/2019 12:47        Scheduled Meds:  enoxaparin (LOVENOX) injection  75 mg Subcutaneous Q12H   PHENObarbital  97.2 mg Intravenous QHS   polyethylene glycol  17 g Oral BID   Continuous Infusions:   LOS: 1 day    Time spent: 1min    Domenic Polite, MD [facility]  10/15/2019, 11:30 AM

## 2019-10-16 DIAGNOSIS — Z880 Allergy status to penicillin: Secondary | ICD-10-CM

## 2019-10-16 DIAGNOSIS — Z87820 Personal history of traumatic brain injury: Secondary | ICD-10-CM

## 2019-10-16 DIAGNOSIS — F79 Unspecified intellectual disabilities: Secondary | ICD-10-CM | POA: Diagnosis not present

## 2019-10-16 DIAGNOSIS — R509 Fever, unspecified: Secondary | ICD-10-CM

## 2019-10-16 DIAGNOSIS — G809 Cerebral palsy, unspecified: Secondary | ICD-10-CM | POA: Diagnosis not present

## 2019-10-16 DIAGNOSIS — I82402 Acute embolism and thrombosis of unspecified deep veins of left lower extremity: Secondary | ICD-10-CM | POA: Diagnosis not present

## 2019-10-16 DIAGNOSIS — Z982 Presence of cerebrospinal fluid drainage device: Secondary | ICD-10-CM

## 2019-10-16 DIAGNOSIS — Z993 Dependence on wheelchair: Secondary | ICD-10-CM

## 2019-10-16 LAB — BASIC METABOLIC PANEL
Anion gap: 10 (ref 5–15)
BUN: 8 mg/dL (ref 6–20)
CO2: 26 mmol/L (ref 22–32)
Calcium: 8.6 mg/dL — ABNORMAL LOW (ref 8.9–10.3)
Chloride: 101 mmol/L (ref 98–111)
Creatinine, Ser: 0.54 mg/dL (ref 0.44–1.00)
GFR calc Af Amer: >60 mL/min (ref >60)
GFR calc non Af Amer: >60 mL/min (ref >60)
Glucose, Bld: 90 mg/dL (ref 70–99)
Potassium: 3.6 mmol/L (ref 3.5–5.1)
Sodium: 137 mmol/L (ref 135–145)

## 2019-10-16 LAB — CBC
HCT: 40.9 % (ref 36.0–46.0)
Hemoglobin: 14.7 g/dL (ref 12.0–15.0)
MCH: 28.5 pg (ref 26.0–34.0)
MCHC: 35.9 g/dL (ref 30.0–36.0)
MCV: 79.4 fL — ABNORMAL LOW (ref 80.0–100.0)
Platelets: 225 10*3/uL (ref 150–400)
RBC: 5.15 MIL/uL — ABNORMAL HIGH (ref 3.87–5.11)
RDW: 13.1 % (ref 11.5–15.5)
WBC: 9 10*3/uL (ref 4.0–10.5)
nRBC: 0 % (ref 0.0–0.2)

## 2019-10-16 MED ORDER — ENOXAPARIN SODIUM 80 MG/0.8ML ~~LOC~~ SOLN
75.0000 mg | Freq: Two times a day (BID) | SUBCUTANEOUS | Status: DC
  Administered 2019-10-17 – 2019-10-21 (×8): 75 mg via SUBCUTANEOUS
  Filled 2019-10-16 (×8): qty 0.8

## 2019-10-16 MED ORDER — IBUPROFEN 200 MG PO TABS
600.0000 mg | ORAL_TABLET | Freq: Four times a day (QID) | ORAL | Status: DC | PRN
  Administered 2019-10-16: 600 mg via ORAL
  Filled 2019-10-16: qty 3

## 2019-10-16 MED ORDER — IBUPROFEN 200 MG PO TABS
600.0000 mg | ORAL_TABLET | Freq: Four times a day (QID) | ORAL | Status: AC | PRN
  Administered 2019-10-16 – 2019-10-17 (×2): 600 mg via ORAL
  Filled 2019-10-16 (×3): qty 3

## 2019-10-16 NOTE — Progress Notes (Signed)
Patient's father, Conception Chancy, has been given permission by charge RN to visit due to patient being confused.

## 2019-10-16 NOTE — Progress Notes (Addendum)
PROGRESS NOTE    Tracey Graham  ZTI:[id number] DOB: 1986/12/05 DOA: 10/13/2019 PCP: Clinic, General Medical  Brief Narrative: Tracey Graham is a 33 y.o. female with medical history significant for cerebral palsy, wheelchair-bound and fully dependent with ADLs, hydrocephalus s/p VP shunt, seizure disorder, and intellectual disability who presents to the ED for evaluation of fevers and feeling unwell. -In emergency room was febrile to 101.8, white count was 11.3, rest of her electrolytes were unremarkable, urinalysis was negative, chest x-ray was unremarkable, COVID-19 PCR was negative -CT abdomen/pelvis without contrast showed trace lower abdominal pelvic free fluid per radiology may be physiologic or related to chronic VP shunt tubing.  No fluid collection, abscess, or acute intra-abdominal or pelvic finding seen. -VP shunt series showed visualized VP shunt catheter intact on skull, chest, and abdominal x-rays.  CT head without contrast showed a left frontal approach CSF shunt which appeared stable along with decompressed ventricles since 2014.  No acute intracranial abnormality noted. -Subsequent work-up noted left leg DVT, started anticoagulation -slow improvement but low grade fevers persist  Assessment & Plan:  Fevers, acute DVT -Dopplers noted acute DVTs in left lower extremity, this was suspected to be the etiology of her fevers, started full dose anticoagulation with Lovenox 10/18 -Called and discussed with infectious disease Dr. Lorenso Courier 10/18, he agreed with treating DVT and monitor for response to therapy -Urinalysis is unremarkable, chest x-ray is benign including repeat, procalcitonin less than 0.1  -COVID-19 PCR is negative, -CT abdomen pelvis without contrast was unremarkable, CT head was also benign -Right upper quadrant ultrasound also benign,  -Overall mild improvement in fever curve but generalized malaise and low-grade temps persist, will request ID input Addendum:  Discussed with ID, meningitis less likely but will request LP in fluoroscopy to rule out aseptic meningitis, called fluoroscopy this afternoon, discussed with schedulers, hopeful for this to happen tomorrow a.m., hold Lovenox dose this evening and n.p.o. after midnight  Hydrocephalus s/p chronic VP shunt: VP shunt series showed visualized VP shunt catheter intact on imaging,.  CT head without contrast showed a left frontal approach CSF shunt which appeared stable along with decompressed ventricles since 2014.   Seizure disorder: Continue phenobarbital, converted to IV formulation  Dysphagia -Given cerebral palsy, SLP following, continue dysphagia diet  Constipation -continue  MiraLAX and Dulcolax  -BM yesterday  DVT prophylaxis: Lovenox Code Status: Full code, confirmed with mother Family Communication: Discussed with mother at bedside Disposition Plan: Pending clinical progress  Consultants:   Case d/w NSG 10/17   Procedures:   Antimicrobials:    Subjective: -Oral intake slowly improving but not back to baseline, continues to have fevers but low-grade,, up to 101 last night  Objective: Vitals:   10/16/19 0343 10/16/19 0813 10/16/19 0821 10/16/19 1125  BP: 131/82 136/86 136/86   Pulse: 87 (!) 105 (!) 108   Resp: 18 16 18    Temp: 99.4 F (37.4 C) 100 F (37.8 C) 100 F (37.8 C) 100 F (37.8 C)  TempSrc: Oral Axillary Oral Axillary  SpO2: 98% 100% 100%   Weight:      Height:        Intake/Output Summary (Last 24 hours) at 10/16/2019 1432 Last data filed at 10/16/2019 0900 Gross per 24 hour  Intake 120 ml  Output 500 ml  Net -380 ml   Filed Weights   10/13/19 1003 10/13/19 2006 10/14/19 0237  Weight: 72.6 kg 74.4 kg 74.7 kg    Examination:  Gen: Chronically ill obese female, nonverbal, slightly more  comfortable than yesterday but moans intermittently HEENT: PERRLA, Neck supple, no JVD Lungs: Clear CVS: RRR,No Gallops,Rubs or new Murmurs Abd: soft,  Non tender, non distended, BS present Extremities: Trace edema  skin: no new rashes Central nervous system: Nonverbal, some spastic changes in lower legs, Psychiatry: Unable to assess   Data Reviewed:   CBC: Recent Labs  Lab 10/13/19 1144 10/14/19 0638 10/16/19 0609  WBC 11.4* 11.8* 9.0  NEUTROABS 9.4*  --   --   HGB 15.4* 13.3 14.7  HCT 42.7 36.6 40.9  MCV 80.7 79.6* 79.4*  PLT 253 194 240   Basic Metabolic Panel: Recent Labs  Lab 10/13/19 1144 10/14/19 0638 10/16/19 0609  NA 137 140 137  K 4.0 3.8 3.6  CL 101 107 101  CO2 22 22 26   GLUCOSE 108* 103* 90  BUN 5* 7 8  CREATININE 0.61 0.56 0.54  CALCIUM 9.0 8.0* 8.6*   GFR: Estimated Creatinine Clearance: 99 mL/min (by C-G formula based on SCr of 0.54 mg/dL). Liver Function Tests: Recent Labs  Lab 10/13/19 1144  AST 17  ALT 20  ALKPHOS 116  BILITOT 0.5  PROT 7.6  ALBUMIN 4.0   No results for input(s): LIPASE, AMYLASE in the last 168 hours. No results for input(s): AMMONIA in the last 168 hours. Coagulation Profile: Recent Labs  Lab 10/13/19 1144  INR 1.1   Cardiac Enzymes: No results for input(s): CKTOTAL, CKMB, CKMBINDEX, TROPONINI in the last 168 hours. BNP (last 3 results) No results for input(s): PROBNP in the last 8760 hours. HbA1C: No results for input(s): HGBA1C in the last 72 hours. CBG: No results for input(s): GLUCAP in the last 168 hours. Lipid Profile: No results for input(s): CHOL, HDL, LDLCALC, TRIG, CHOLHDL, LDLDIRECT in the last 72 hours. Thyroid Function Tests: No results for input(s): TSH, T4TOTAL, FREET4, T3FREE, THYROIDAB in the last 72 hours. Anemia Panel: No results for input(s): VITAMINB12, FOLATE, FERRITIN, TIBC, IRON, RETICCTPCT in the last 72 hours. Urine analysis:    Component Value Date/Time   COLORURINE YELLOW 10/13/2019 1710   APPEARANCEUR HAZY (A) 10/13/2019 1710   LABSPEC 1.029 10/13/2019 1710   PHURINE 5.0 10/13/2019 1710   GLUCOSEU NEGATIVE 10/13/2019 1710    HGBUR NEGATIVE 10/13/2019 1710   BILIRUBINUR NEGATIVE 10/13/2019 1710   KETONESUR 80 (A) 10/13/2019 1710   PROTEINUR 30 (A) 10/13/2019 1710   UROBILINOGEN 1.0 01/05/2013 2116   NITRITE NEGATIVE 10/13/2019 1710   LEUKOCYTESUR NEGATIVE 10/13/2019 1710   Sepsis Labs: @LABRCNTIP (procalcitonin:4,lacticidven:4)  ) Recent Results (from the past 240 hour(s))  Culture, blood (Routine x 2)     Status: None (Preliminary result)   Collection Time: 10/13/19  8:55 AM   Specimen: BLOOD  Result Value Ref Range Status   Specimen Description BLOOD LEFT ANTECUBITAL  Final   Special Requests   Final    BOTTLES DRAWN AEROBIC ONLY Blood Culture adequate volume   Culture   Final    NO GROWTH 3 DAYS Performed at [facility], [place] [address], [place],  [address]    Report Status PENDING  Incomplete  Culture, blood (Routine x 2)     Status: None (Preliminary result)   Collection Time: 10/13/19 11:44 AM   Specimen: BLOOD  Result Value Ref Range Status   Specimen Description BLOOD RIGHT ANTECUBITAL  Final   Special Requests   Final    BOTTLES DRAWN AEROBIC AND ANAEROBIC Blood Culture results may not be optimal due to an inadequate volume of blood received  in culture bottles   Culture   Final    NO GROWTH 3 DAYS Performed at [facility], [address] [place], [place] [address]    Report Status PENDING  Incomplete  SARS CORONAVIRUS 2 (TAT 6-24 HRS) Nasopharyngeal Nasopharyngeal Swab     Status: None   Collection Time: 10/13/19  8:05 PM   Specimen: Nasopharyngeal Swab  Result Value Ref Range Status   SARS Coronavirus 2 NEGATIVE NEGATIVE Final    Comment: (NOTE) SARS-CoV-2 target nucleic acids are NOT DETECTED. The SARS-CoV-2 RNA is generally detectable in upper and lower respiratory specimens during the acute phase of infection. Negative results do not preclude SARS-CoV-2 infection, do not rule out co-infections with other pathogens, and should not be used as the sole basis  for treatment or other patient management decisions. Negative results must be combined with clinical observations, patient history, and epidemiological information. The expected result is Negative. Fact Sheet for Patients: HairSlick.nohttps://www.fda.gov/media/138098/download Fact Sheet for Healthcare Providers: quierodirigir.comhttps://www.fda.gov/media/138095/download This test is not yet approved or cleared by the [place] FDA and  has been authorized for detection and/or diagnosis of SARS-CoV-2 by FDA under an Emergency Use Authorization (EUA). This EUA will remain  in effect (meaning this test can be used) for the duration of the COVID-19 declaration under Section 56 4(b)(1) of the Act, 21 U.S.C. section 360bbb-3(b)(1), unless the authorization is terminated or revoked sooner. Performed at [facility], [address] [place], [place] [address]          Radiology Studies: No results found.      Scheduled Meds: . enoxaparin (LOVENOX) injection  75 mg Subcutaneous Q12H  . PHENObarbital  97.2 mg Intravenous QHS  . polyethylene glycol  17 g Oral BID   Continuous Infusions:   LOS: 2 days    Time spent: 25min    Zannie CovePreetha Donnavan Covault, MD [facility]  10/16/2019, 2:32 PM

## 2019-10-16 NOTE — Consult Note (Signed)
[name] for Infectious Disease    Date of Admission:  10/13/2019       Reason for Consult: Fever     Referring Provider: Domenic Polite, MD Primary Care Provider: General Medical Clinic  Assessment: In summary, [name] is a 33 year old female with a history significant for cerebral palsy, intellectual disability, and hydrocephalus status post VP shunt who presented with fevers of an unknown source initially.  She was recently discovered to have a left lower extremity DVT, which may be the source of her fever. However, given the fact that she had abdominal discomfort and has a VP shunt there is a concern for potential meningitis/peritonitis, especially in the context of having fevers.  Plan: 1. Recommend obtaining lumbar puncture to evaluate CSF to rule out aseptic meningitis 2. Hold anticoagulation this evening     Principal Problem:   Fever of unknown origin (FUO) Active Problems:   Hydrocephalus (HCC)   VP (ventriculoperitoneal) shunt status   Seizure disorder (HCC)   Fever of unknown origin   enoxaparin (LOVENOX) injection  75 mg Subcutaneous Q12H   PHENObarbital  97.2 mg Intravenous QHS   polyethylene glycol  17 g Oral BID   HPI: History was obtained through chart review and talking to the mother who is present at the bedside.   Tracey Graham is a 33 y.o. female with a past medical history significant for cerebral palsy resulting her being wheelchair-bound, intellectual disability, and hydrocephalus status post VP shunt who presented to the ED on 10/17 with a fever of 101.8 and abdominal discomfort.  Her initial white count was 11.4 but has down trended to 9.0 today.  BMP has been normal.  Procalcitonin was normal.  Blood cultures have been negative for the last 3 days.  She also had a urinalysis which was normal.  In the ED, a CT abdomen was obtained which did not show any acute abdominal findings.  CT head was negative as well.  Chest x-ray was  obtained and did not show pulmonary findings.  Right upper quadrant ultrasound was negative as well.  Neurosurgery has seen the patient and evaluated her shunt and do not think this is the cause of her fevers or discomfort.  However, the patient had a left lower extremity acute DVT on ultrasound.  She was started on therapeutic Lovenox yesterday.  Review of Systems: All systems were reviewed and are otherwise negative unless mentioned in the HPI.  Past Medical History:  Diagnosis Date   Hydrocephalus ([place])    Seizures ([place])    VP (ventriculoperitoneal) shunt status     Social History   Tobacco Use   Smoking status: Never Smoker   Smokeless tobacco: Never Used  Substance Use Topics   Alcohol use: No   Drug use: No    Family History  Problem Relation Age of Onset   Hypertension Mother    Hypertension Father    Diabetes Father    Allergies  Allergen Reactions   Penicillins Swelling    Did not have reaction to 5d of amoxicillin Has patient had a PCN reaction causing immediate rash, facial/tongue/throat swelling, SOB or lightheadedness with hypotension: Yes Has patient had a PCN reaction causing severe rash involving mucus membranes or skin necrosis: No Has patient had a PCN reaction that required hospitalization No Has patient had a PCN reaction occurring within the last 10 years: No If all of the above answers are "NO", then may proceed with Cephalosporin use.  OBJECTIVE: Blood pressure 136/86, pulse (!) 108, temperature 100 F (37.8 C), temperature source Axillary, resp. rate 18, height 5\' 4"  (1.626 m), weight 74.7 kg, last menstrual period 10/01/2019, SpO2 100 %.  Physical Exam Vitals signs reviewed.  Constitutional:      General: She is in acute distress.     Appearance: She is well-developed. She is obese. She is not toxic-appearing or diaphoretic.  HENT:     Head: Normocephalic and atraumatic.     Comments: Unable to access for nuchal rigidity, pt  resists passive flexion of the neck Cardiovascular:     Rate and Rhythm: Normal rate and regular rhythm.     Pulses: Normal pulses.     Heart sounds: Normal heart sounds. No murmur. No friction rub. No gallop.   Pulmonary:     Effort: Pulmonary effort is normal. No respiratory distress.     Breath sounds: Normal breath sounds. No wheezing or rales.  Abdominal:     General: Bowel sounds are normal. There is no distension.     Palpations: Abdomen is soft. There is no mass.     Tenderness: There is no abdominal tenderness. There is no guarding.  Musculoskeletal:     Comments: bilateral lower extremity swelling, no pitting edema  Mild L lateral knee bruising  Skin:    General: Skin is warm.  Neurological:     Mental Status: She is alert.    Lab Results Lab Results  Component Value Date   WBC 9.0 10/16/2019   HGB 14.7 10/16/2019   HCT 40.9 10/16/2019   MCV 79.4 (L) 10/16/2019   PLT 225 10/16/2019    Lab Results  Component Value Date   CREATININE 0.54 10/16/2019   BUN 8 10/16/2019   NA 137 10/16/2019   K 3.6 10/16/2019   CL 101 10/16/2019   CO2 26 10/16/2019    Lab Results  Component Value Date   ALT 20 10/13/2019   AST 17 10/13/2019   ALKPHOS 116 10/13/2019   BILITOT 0.5 10/13/2019    Microbiology: Recent Results (from the past 240 hour(s))  Culture, blood (Routine x 2)     Status: None (Preliminary result)   Collection Time: 10/13/19  8:55 AM   Specimen: BLOOD  Result Value Ref Range Status   Specimen Description BLOOD LEFT ANTECUBITAL  Final   Special Requests   Final    BOTTLES DRAWN AEROBIC ONLY Blood Culture adequate volume   Culture   Final    NO GROWTH 3 DAYS Performed at [facility], [address], [place], [place]    Report Status PENDING  Incomplete  Culture, blood (Routine x 2)     Status: None (Preliminary result)   Collection Time: 10/13/19 11:44 AM   Specimen: BLOOD  Result Value Ref Range Status   Specimen Description BLOOD  RIGHT ANTECUBITAL  Final   Special Requests   Final    BOTTLES DRAWN AEROBIC AND ANAEROBIC Blood Culture results may not be optimal due to an inadequate volume of blood received in culture bottles   Culture   Final    NO GROWTH 3 DAYS Performed at [facility], [address] [place], [place]    Report Status PENDING  Incomplete  SARS CORONAVIRUS 2 (TAT 6-24 HRS) Nasopharyngeal Nasopharyngeal Swab     Status: None   Collection Time: 10/13/19  8:05 PM   Specimen: Nasopharyngeal Swab  Result Value Ref Range Status   SARS Coronavirus 2  NEGATIVE NEGATIVE Final    Comment: (NOTE) SARS-CoV-2 target nucleic acids are NOT DETECTED. The SARS-CoV-2 RNA is generally detectable in upper and lower respiratory specimens during the acute phase of infection. Negative results do not preclude SARS-CoV-2 infection, do not rule out co-infections with other pathogens, and should not be used as the sole basis for treatment or other patient management decisions. Negative results must be combined with clinical observations, patient history, and epidemiological information. The expected result is Negative. Fact Sheet for Patients: HairSlick.nohttps://www.fda.gov/media/138098/download Fact Sheet for Healthcare Providers: quierodirigir.comhttps://www.fda.gov/media/138095/download This test is not yet approved or cleared by the [place] FDA and  has been authorized for detection and/or diagnosis of SARS-CoV-2 by FDA under an Emergency Use Authorization (EUA). This EUA will remain  in effect (meaning this test can be used) for the duration of the COVID-19 declaration under Section 56 4(b)(1) of the Act, 21 U.S.C. section 360bbb-3(b)(1), unless the authorization is terminated or revoked sooner. Performed at [facility], [address] [place], [place] [address]    Kirt BoysAndrew Keller Bounds, MD Internal Medicine, PGY1 Pager: [phone]  10/16/2019,2:14 PM

## 2019-10-17 ENCOUNTER — Inpatient Hospital Stay (HOSPITAL_COMMUNITY): Payer: Medicaid Other

## 2019-10-17 LAB — CBC
HCT: 38.4 % (ref 36.0–46.0)
Hemoglobin: 13.6 g/dL (ref 12.0–15.0)
MCH: 28.3 pg (ref 26.0–34.0)
MCHC: 35.4 g/dL (ref 30.0–36.0)
MCV: 79.8 fL — ABNORMAL LOW (ref 80.0–100.0)
Platelets: 235 10*3/uL (ref 150–400)
RBC: 4.81 MIL/uL (ref 3.87–5.11)
RDW: 13.1 % (ref 11.5–15.5)
WBC: 7.7 10*3/uL (ref 4.0–10.5)
nRBC: 0 % (ref 0.0–0.2)

## 2019-10-17 LAB — COMPREHENSIVE METABOLIC PANEL
ALT: 18 U/L (ref 0–44)
AST: 17 U/L (ref 15–41)
Albumin: 3 g/dL — ABNORMAL LOW (ref 3.5–5.0)
Alkaline Phosphatase: 86 U/L (ref 38–126)
Anion gap: 12 (ref 5–15)
BUN: 8 mg/dL (ref 6–20)
CO2: 26 mmol/L (ref 22–32)
Calcium: 8.5 mg/dL — ABNORMAL LOW (ref 8.9–10.3)
Chloride: 100 mmol/L (ref 98–111)
Creatinine, Ser: 0.53 mg/dL (ref 0.44–1.00)
Glucose, Bld: 87 mg/dL (ref 70–99)
Potassium: 3.4 mmol/L — ABNORMAL LOW (ref 3.5–5.1)
Sodium: 138 mmol/L (ref 135–145)
Total Bilirubin: 0.6 mg/dL (ref 0.3–1.2)
Total Protein: 6.1 g/dL — ABNORMAL LOW (ref 6.5–8.1)

## 2019-10-17 LAB — CSF CELL COUNT WITH DIFFERENTIAL
Lymphs, CSF: 88 % — ABNORMAL HIGH (ref 40–80)
Monocyte-Macrophage-Spinal Fluid: 4 % — ABNORMAL LOW (ref 15–45)
RBC Count, CSF: 715 /mm3 — ABNORMAL HIGH
Segmented Neutrophils-CSF: 8 % — ABNORMAL HIGH (ref 0–6)
Tube #: 3
WBC, CSF: 61 /mm3 (ref 0–5)

## 2019-10-17 LAB — PROTEIN, CSF: Total  Protein, CSF: 144 mg/dL — ABNORMAL HIGH (ref 15–45)

## 2019-10-17 LAB — GLUCOSE, CSF: Glucose, CSF: 43 mg/dL (ref 40–70)

## 2019-10-17 MED ORDER — LIDOCAINE HCL (PF) 1 % IJ SOLN
5.0000 mL | Freq: Once | INTRAMUSCULAR | Status: AC
  Administered 2019-10-17: 5 mL via INTRADERMAL

## 2019-10-17 MED ORDER — POTASSIUM CHLORIDE 10 MEQ/100ML IV SOLN
10.0000 meq | INTRAVENOUS | Status: AC
  Administered 2019-10-17 (×2): 10 meq via INTRAVENOUS
  Filled 2019-10-17 (×2): qty 100

## 2019-10-17 NOTE — Progress Notes (Signed)
[facility] for Lovenox Indication: DVT  Allergies  Allergen Reactions  . Penicillins Swelling    Did not have reaction to 5d of amoxicillin Has patient had a PCN reaction causing immediate rash, facial/tongue/throat swelling, SOB or lightheadedness with hypotension: Yes Has patient had a PCN reaction causing severe rash involving mucus membranes or skin necrosis: No Has patient had a PCN reaction that required hospitalization No Has patient had a PCN reaction occurring within the last 10 years: No If all of the above answers are "NO", then may proceed with Cephalosporin use.     Patient Measurements: Height: 5\' 4"  (162.6 cm) Weight: 164 lb 10.9 oz (74.7 kg) IBW/kg (Calculated) : 54.7  Vital Signs: Temp: 99.3 F (37.4 C) (10/21 1100) Temp Source: Axillary (10/21 1100) BP: 141/77 (10/21 1100) Pulse Rate: 90 (10/21 1100)  Labs: Recent Labs    10/16/19 0609 10/17/19 0301  HGB 14.7 13.6  HCT 40.9 38.4  PLT 225 235  CREATININE 0.54 0.53    Estimated Creatinine Clearance: 99 mL/min (by C-G formula based on SCr of 0.53 mg/dL).   Medical History: Past Medical History:  Diagnosis Date  . Hydrocephalus ([place])   . Seizures ([place])   . VP (ventriculoperitoneal) shunt status     Assessment: 60 YOF who presented on 10/17 with fevers/abdominal pain - now found to have an acute DVT. Pharmacy consulted to start full dose lovenox for anticoagulation. SCr and CBC stable. No active bleed issues documented.  Goal of Therapy:  Anti-Xa level 0.6-1 units/ml 4hrs after LMWH dose given Monitor platelets by anticoagulation protocol: Yes   Plan:  Continue Lovenox 75 mg SQ every 12 hours Monitor SCr, CBC at least q72h, s/sx bleeding Follow-up on transition to oral [facility]   Elicia Lamp, PharmD, BCPS Please check AMION for all [facility] contact numbers Clinical Pharmacist 10/17/2019 1:59 PM

## 2019-10-17 NOTE — Consult Note (Signed)
Subjective: Pt seen at the bedside this afternoon. Mother present at the bedside. The patient is responsive to yes no questions. Says she feels better than she did yesterday. Mother says she is acting more like herself. We discussed the preliminary results of the LP. We will continue to follow up. Mother endorses understanding.  Antibiotics:  Anti-infectives (From admission, onward)   None     Medications: Scheduled Meds:  enoxaparin (LOVENOX) injection  75 mg Subcutaneous Q12H   PHENObarbital  97.2 mg Intravenous QHS   polyethylene glycol  17 g Oral BID   Continuous Infusions: PRN Meds:.acetaminophen **OR** acetaminophen, ibuprofen, ondansetron **OR** ondansetron (ZOFRAN) IV, senna-docusate  Objective: Weight change:   Intake/Output Summary (Last 24 hours) at 10/17/2019 1638 Last data filed at 10/17/2019 1200 Gross per 24 hour  Intake 120 ml  Output --  Net 120 ml   Blood pressure (!) 141/77, pulse 90, temperature 99.3 F (37.4 C), temperature source Axillary, resp. rate 18, height 5\' 4"  (1.626 m), weight 74.7 kg, last menstrual period 10/01/2019, SpO2 93 %. Temp:  [98.8 F (37.1 C)-99.9 F (37.7 C)] 99.3 F (37.4 C) (10/21 1100) Pulse Rate:  [90-101] 90 (10/21 1100) Resp:  [17-20] 18 (10/21 1100) BP: (119-150)/(61-92) 141/77 (10/21 1100) SpO2:  [91 %-100 %] 93 % (10/21 1100)  Physical Exam: General: Laying in bed HEENT: NCAT, round 1cm x 1cm lesion on Lip, slightly crusted. PULM: Normal WOB Neuro: Alert, answers yes, no questions, nonfocal  CBC: CBC Latest Ref Rng & Units 10/17/2019 10/16/2019 10/14/2019  WBC 4.0 - 10.5 K/uL 7.7 9.0 11.8(H)  Hemoglobin 12.0 - 15.0 g/dL 13.6 14.7 13.3  Hematocrit 36.0 - 46.0 % 38.4 40.9 36.6  Platelets 150 - 400 K/uL 235 225 194   BMET Recent Labs    10/16/19 0609 10/17/19 0301  NA 137 138  K 3.6 3.4*  CL 101 100  CO2 26 26  GLUCOSE 90 87  BUN 8 8  CREATININE 0.54 0.53  CALCIUM 8.6* 8.5*   Liver  Panel Recent Labs    10/17/19 0301  PROT 6.1*  ALBUMIN 3.0*  AST 17  ALT 18  ALKPHOS 86  BILITOT 0.6   Micro Results: Recent Results (from the past 720 hour(s))  Culture, blood (Routine x 2)     Status: None (Preliminary result)   Collection Time: 10/13/19  8:55 AM   Specimen: BLOOD  Result Value Ref Range Status   Specimen Description BLOOD LEFT ANTECUBITAL  Final   Special Requests   Final    BOTTLES DRAWN AEROBIC ONLY Blood Culture adequate volume   Culture   Final    NO GROWTH 4 DAYS Performed at [facility], [address] [place], [place] [address]    Report Status PENDING  Incomplete  Culture, blood (Routine x 2)     Status: None (Preliminary result)   Collection Time: 10/13/19 11:44 AM   Specimen: BLOOD  Result Value Ref Range Status   Specimen Description BLOOD RIGHT ANTECUBITAL  Final   Special Requests   Final    BOTTLES DRAWN AEROBIC AND ANAEROBIC Blood Culture results may not be optimal due to an inadequate volume of blood received in culture bottles   Culture   Final    NO GROWTH 4 DAYS Performed at [facility], [place] [address] [place], [place] [address]    Report Status PENDING  Incomplete  SARS CORONAVIRUS 2 (TAT 6-24 HRS) Nasopharyngeal Nasopharyngeal Swab  Status: None   Collection Time: 10/13/19  8:05 PM   Specimen: Nasopharyngeal Swab  Result Value Ref Range Status   SARS Coronavirus 2 NEGATIVE NEGATIVE Final    Comment: (NOTE) SARS-CoV-2 target nucleic acids are NOT DETECTED. The SARS-CoV-2 RNA is generally detectable in upper and lower respiratory specimens during the acute phase of infection. Negative results do not preclude SARS-CoV-2 infection, do not rule out co-infections with other pathogens, and should not be used as the sole basis for treatment or other patient management decisions. Negative results must be combined with clinical observations, patient history, and epidemiological information. The expected result is  Negative. Fact Sheet for Patients: HairSlick.nohttps://www.fda.gov/media/138098/download Fact Sheet for Healthcare Providers: quierodirigir.comhttps://www.fda.gov/media/138095/download This test is not yet approved or cleared by the [place] FDA and  has been authorized for detection and/or diagnosis of SARS-CoV-2 by FDA under an Emergency Use Authorization (EUA). This EUA will remain  in effect (meaning this test can be used) for the duration of the COVID-19 declaration under Section 56 4(b)(1) of the Act, 21 U.S.C. section 360bbb-3(b)(1), unless the authorization is terminated or revoked sooner. Performed at [facility], [address] [place], [place] [address]   CSF culture     Status: None (Preliminary result)   Collection Time: 10/17/19 10:05 AM   Specimen: PATH Cytology CSF; Cerebrospinal Fluid  Result Value Ref Range Status   Specimen Description CSF  Final   Special Requests NONE  Final   Gram Stain   Final    WBC PRESENT,BOTH PMN AND MONONUCLEAR NO ORGANISMS SEEN CYTOSPIN SMEAR Performed at [facility], [address] [place], [place] [address]    Culture PENDING  Incomplete   Report Status PENDING  Incomplete   Studies/Results: Dg Fl Guided Lumbar Puncture  Result Date: 10/17/2019 CLINICAL DATA:  Fever. EXAM: DIAGNOSTIC LUMBAR PUNCTURE UNDER FLUOROSCOPIC GUIDANCE FLUOROSCOPY TIME:  Fluoroscopy Time:  18 seconds Radiation Exposure Index (if provided by the fluoroscopic device): 1.8 mGy Number of Acquired Spot Images: 0 PROCEDURE: Informed consent was obtained from the patient prior to the procedure, including potential complications of headache, allergy, and pain. With the patient prone, the lower back was prepped with Betadine. 1% Lidocaine was used for local anesthesia. Lumbar puncture was performed at the L4-L5 level using a 21 gauge needle with return of clear CSF with grossly normal opening pressure to slightly diminished, CSF flowed slowly mild blood-tinged was noted on  later samples perhaps from adjacent venous plexus, 7 ml of CSF were obtained for laboratory studies. The patient tolerated the procedure well and there were no apparent complications. IMPRESSION: Technically successful L4-L5 lumbar puncture without signs of complication. Electronically Signed   By: Donzetta KohutGeoffrey  Wile M.D.   On: 10/17/2019 10:19   Assessment/Plan:  INTERVAL HISTORY: LP performed the morning of 10/21. Gram stain negative for organisms. CSF culture pending. CSF did elevated WBC (61) predominantly lymphocytic (88%)  Principal Problem:   Fever of unknown origin (FUO) Active Problems:   Hydrocephalus (HCC)   VP (ventriculoperitoneal) shunt status   Seizure disorder (HCC)   Fever of unknown origin  Tracey HomesVanessa Vanostrand is a 33 y.o. female with a history significant for cerebral palsy, intellectual disability, and hydrocephalus status post VP shunt who presented with fevers of an unknown source initially. She was recently discovered to have a left lower extremity DVT, which I suspect may be the source of her initial fever. The patient has been afebrile for the last 24 hours. CSF findings of elevated  protein and lymphocytosis may suggest viral infection.   1. Restart therapeutic Lovenox for DVT treatment.  2. Follow up CSF studies    LOS: 3 days   Tracey Boys, MD Internal Medicine, PGY1 Pager: [phone]  10/17/2019,4:39 PM

## 2019-10-17 NOTE — Progress Notes (Signed)
PROGRESS NOTE  Tracey Graham SJG:[id number] DOB: [birth year] DOA: 10/13/2019 PCP: Clinic, General Medical  HPI/Recap of past 24 hours: Tracey Tomlinsonis a 33 y.o.femalewith medical history significant forcerebral palsy,wheelchair-bound and fully dependent with ADLs,hydrocephalus s/p VP shunt, seizure disorder, and intellectual disability who presents to the ED for evaluation of fevers and feeling unwell. -In emergency room was febrile to 101.8, white count was 11.3, rest of her electrolytes were unremarkable, urinalysis was negative, chest x-ray was unremarkable, COVID-19 PCR was negative -CT abdomen/pelvis without contrast showed trace lower abdominal pelvic free fluid per radiology may be physiologic or related to chronic VP shunt tubing. No fluid collection, abscess, or acute intra-abdominal or pelvic finding seen. -VP shunt series showed visualized VP shunt catheter intact on skull, chest, and abdominal x-rays. CT head without contrast showed a left frontal approach CSF shunt which appeared stable along with decompressed ventricles since 2014. No acute intracranial abnormality noted. -Subsequent work-up noted left leg DVT, started anticoagulation -slow improvement but low grade fevers persist.  10/17/19: Patient was seen and examined with her mother at her bedside this morning.  Had intermittent burst of crying spells.  Alert and confused.  Appears uncomfortable.  Plan for LP today to rule out aseptic meningitis.  Persistent low-grade fevers with T-max of 99.8 overnight.    Assessment/Plan: Principal Problem:   Fever of unknown origin (FUO) Active Problems:   Hydrocephalus (HCC)   VP (ventriculoperitoneal) shunt status   Seizure disorder (HCC)   Fever of unknown origin  Persistent fever of unclear etiology Recently diagnosed with acute DVT of left lower extremity Started full dose anticoagulation subcu Lovenox on 10/14/2019 She continues to have low-grade fevers  Other than positive DVT work-up has been unrevealing thus far. Seen by infectious disease, following No leukocytosis Procalcitonin less than 0.10 on 10/14/2019 Lactic acid 1.1 on 10/13/2019 Urine analysis, chest x-ray, right upper quadrant abdominal ultrasound, all benign.  Acute left lower extremity DVT. Continue anticoagulation Plan subcu Lovenox 75 mg twice daily  Seizure disorder Continue AED  Hypokalemia Potassium 3.4 Repleted with IV 20 mEq  Hydrocephalus s/p chronic VP shunt: VP shunt series showed visualized VP shunt catheter intact on imaging,. CT head without contrast showed a left frontal approach CSF shunt which appeared stable along with decompressed ventricles since 2014.   Dysphagia -Given cerebral palsy, SLP following, continue dysphagia diet  Constipation -continue  MiraLAX and Dulcolax  -BM yesterday  DVT prophylaxis:Lovenox Code Status:Full code, confirmed with mother Family Communication:Discussed with mother at bedside Disposition Plan:Pending clinical progress  Consultants:   Case d/w NSG 10/17   Procedures:     Objective: Vitals:   10/16/19 2007 10/16/19 2351 10/17/19 0354 10/17/19 0813  BP: 131/76 119/61 (!) 133/92 (!) 150/80  Pulse: 98 (!) 101 94 92  Resp: 17 20 17 18   Temp: 99.7 F (37.6 C) 99.7 F (37.6 C) 98.8 F (37.1 C) 99.9 F (37.7 C)  TempSrc: Oral Oral Oral Axillary  SpO2: 99% 100% 98% 91%  Weight:      Height:        Intake/Output Summary (Last 24 hours) at 10/17/2019 0933 Last data filed at 10/16/2019 1500 Gross per 24 hour  Intake 120 ml  Output 400 ml  Net -280 ml   Filed Weights   10/13/19 1003 10/13/19 2006 10/14/19 0237  Weight: 72.6 kg 74.4 kg 74.7 kg    Exam:  . General: 33 y.o. year-old female well developed well nourished in no acute distress.  Alert and confused. . Cardiovascular:  Regular rate and rhythm with no rubs or gallops.  No thyromegaly or JVD noted.   Marland Kitchen Respiratory: Clear to  auscultation with no wheezes or rales. Good inspiratory effort. . Abdomen: Soft nontender nondistended with normal bowel sounds x4 quadrants. . Musculoskeletal: No lower extremity edema. 2/4 pulses in all 4 extremities. Marland Kitchen Psychiatry: Unable to assess mood due to confusion.   Data Reviewed: CBC: Recent Labs  Lab 10/13/19 1144 10/14/19 0638 10/16/19 0609 10/17/19 0301  WBC 11.4* 11.8* 9.0 7.7  NEUTROABS 9.4*  --   --   --   HGB 15.4* 13.3 14.7 13.6  HCT 42.7 36.6 40.9 38.4  MCV 80.7 79.6* 79.4* 79.8*  PLT 253 194 225 161   Basic Metabolic Panel: Recent Labs  Lab 10/13/19 1144 10/14/19 0638 10/16/19 0609 10/17/19 0301  NA 137 140 137 138  K 4.0 3.8 3.6 3.4*  CL 101 107 101 100  CO2 22 22 26 26   GLUCOSE 108* 103* 90 87  BUN 5* 7 8 8   CREATININE 0.61 0.56 0.54 0.53  CALCIUM 9.0 8.0* 8.6* 8.5*   GFR: Estimated Creatinine Clearance: 99 mL/min (by C-G formula based on SCr of 0.53 mg/dL). Liver Function Tests: Recent Labs  Lab 10/13/19 1144 10/17/19 0301  AST 17 17  ALT 20 18  ALKPHOS 116 86  BILITOT 0.5 0.6  PROT 7.6 6.1*  ALBUMIN 4.0 3.0*   No results for input(s): LIPASE, AMYLASE in the last 168 hours. No results for input(s): AMMONIA in the last 168 hours. Coagulation Profile: Recent Labs  Lab 10/13/19 1144  INR 1.1   Cardiac Enzymes: No results for input(s): CKTOTAL, CKMB, CKMBINDEX, TROPONINI in the last 168 hours. BNP (last 3 results) No results for input(s): PROBNP in the last 8760 hours. HbA1C: No results for input(s): HGBA1C in the last 72 hours. CBG: No results for input(s): GLUCAP in the last 168 hours. Lipid Profile: No results for input(s): CHOL, HDL, LDLCALC, TRIG, CHOLHDL, LDLDIRECT in the last 72 hours. Thyroid Function Tests: No results for input(s): TSH, T4TOTAL, FREET4, T3FREE, THYROIDAB in the last 72 hours. Anemia Panel: No results for input(s): VITAMINB12, FOLATE, FERRITIN, TIBC, IRON, RETICCTPCT in the last 72 hours. Urine  analysis:    Component Value Date/Time   COLORURINE YELLOW 10/13/2019 1710   APPEARANCEUR HAZY (A) 10/13/2019 1710   LABSPEC 1.029 10/13/2019 1710   PHURINE 5.0 10/13/2019 1710   GLUCOSEU NEGATIVE 10/13/2019 1710   HGBUR NEGATIVE 10/13/2019 1710   BILIRUBINUR NEGATIVE 10/13/2019 1710   KETONESUR 80 (A) 10/13/2019 1710   PROTEINUR 30 (A) 10/13/2019 1710   UROBILINOGEN 1.0 01/05/2013 2116   NITRITE NEGATIVE 10/13/2019 1710   LEUKOCYTESUR NEGATIVE 10/13/2019 1710   Sepsis Labs: @LABRCNTIP (procalcitonin:4,lacticidven:4)  ) Recent Results (from the past 240 hour(s))  Culture, blood (Routine x 2)     Status: None (Preliminary result)   Collection Time: 10/13/19  8:55 AM   Specimen: BLOOD  Result Value Ref Range Status   Specimen Description BLOOD LEFT ANTECUBITAL  Final   Special Requests   Final    BOTTLES DRAWN AEROBIC ONLY Blood Culture adequate volume   Culture   Final    NO GROWTH 4 DAYS Performed at [facility], [place] [address], [place], [place] [address]    Report Status PENDING  Incomplete  Culture, blood (Routine x 2)     Status: None (Preliminary result)   Collection Time: 10/13/19 11:44 AM   Specimen: BLOOD  Result Value Ref Range Status  Specimen Description BLOOD RIGHT ANTECUBITAL  Final   Special Requests   Final    BOTTLES DRAWN AEROBIC AND ANAEROBIC Blood Culture results may not be optimal due to an inadequate volume of blood received in culture bottles   Culture   Final    NO GROWTH 4 DAYS Performed at [facility], [address] [place], [place] [address]    Report Status PENDING  Incomplete  SARS CORONAVIRUS 2 (TAT 6-24 HRS) Nasopharyngeal Nasopharyngeal Swab     Status: None   Collection Time: 10/13/19  8:05 PM   Specimen: Nasopharyngeal Swab  Result Value Ref Range Status   SARS Coronavirus 2 NEGATIVE NEGATIVE Final    Comment: (NOTE) SARS-CoV-2 target nucleic acids are NOT DETECTED. The SARS-CoV-2 RNA is generally detectable in upper  and lower respiratory specimens during the acute phase of infection. Negative results do not preclude SARS-CoV-2 infection, do not rule out co-infections with other pathogens, and should not be used as the sole basis for treatment or other patient management decisions. Negative results must be combined with clinical observations, patient history, and epidemiological information. The expected result is Negative. Fact Sheet for Patients: HairSlick.nohttps://www.fda.gov/media/138098/download Fact Sheet for Healthcare Providers: quierodirigir.comhttps://www.fda.gov/media/138095/download This test is not yet approved or cleared by the [place] FDA and  has been authorized for detection and/or diagnosis of SARS-CoV-2 by FDA under an Emergency Use Authorization (EUA). This EUA will remain  in effect (meaning this test can be used) for the duration of the COVID-19 declaration under Section 56 4(b)(1) of the Act, 21 U.S.C. section 360bbb-3(b)(1), unless the authorization is terminated or revoked sooner. Performed at [facility], [address] [place], [place] [address]       Studies: No results found.  Scheduled Meds: . enoxaparin (LOVENOX) injection  75 mg Subcutaneous Q12H  . lidocaine (PF)  5 mL Intradermal Once  . PHENObarbital  97.2 mg Intravenous QHS  . polyethylene glycol  17 g Oral BID    Continuous Infusions:   LOS: 3 days     Darlin Droparole N Khloe Hunkele, MD [facility] Pager [phone]  If 7PM-7AM, please contact night-coverage [email or web address] Password [email or web address] 10/17/2019, 9:33 AM

## 2019-10-18 DIAGNOSIS — G030 Nonpyogenic meningitis: Secondary | ICD-10-CM | POA: Diagnosis not present

## 2019-10-18 DIAGNOSIS — I82409 Acute embolism and thrombosis of unspecified deep veins of unspecified lower extremity: Secondary | ICD-10-CM

## 2019-10-18 DIAGNOSIS — Z880 Allergy status to penicillin: Secondary | ICD-10-CM | POA: Diagnosis not present

## 2019-10-18 LAB — CULTURE, BLOOD (ROUTINE X 2)
Culture: NO GROWTH
Culture: NO GROWTH
Special Requests: ADEQUATE

## 2019-10-18 LAB — BASIC METABOLIC PANEL
Anion gap: 10 (ref 5–15)
BUN: 11 mg/dL (ref 6–20)
CO2: 27 mmol/L (ref 22–32)
Calcium: 8.8 mg/dL — ABNORMAL LOW (ref 8.9–10.3)
Chloride: 102 mmol/L (ref 98–111)
Creatinine, Ser: 0.5 mg/dL (ref 0.44–1.00)
GFR calc Af Amer: >60 mL/min (ref >60)
GFR calc non Af Amer: >60 mL/min (ref >60)
Glucose, Bld: 125 mg/dL — ABNORMAL HIGH (ref 70–99)
Potassium: 4.4 mmol/L (ref 3.5–5.1)
Sodium: 139 mmol/L (ref 135–145)

## 2019-10-18 LAB — CBC
HCT: 41.1 % (ref 36.0–46.0)
Hemoglobin: 14.5 g/dL (ref 12.0–15.0)
MCH: 28.4 pg (ref 26.0–34.0)
MCHC: 35.3 g/dL (ref 30.0–36.0)
MCV: 80.4 fL (ref 80.0–100.0)
Platelets: 233 10*3/uL (ref 150–400)
RBC: 5.11 MIL/uL (ref 3.87–5.11)
RDW: 13.4 % (ref 11.5–15.5)
WBC: 8.7 10*3/uL (ref 4.0–10.5)
nRBC: 0 % (ref 0.0–0.2)

## 2019-10-18 LAB — PATHOLOGIST SMEAR REVIEW

## 2019-10-18 MED ORDER — PHENOBARBITAL 32.4 MG PO TABS
97.2000 mg | ORAL_TABLET | Freq: Once | ORAL | Status: DC
  Filled 2019-10-18: qty 3

## 2019-10-18 MED ORDER — SODIUM CHLORIDE 0.9% FLUSH: 10.0000 mL | INTRAVENOUS | Status: DC | PRN

## 2019-10-18 NOTE — Progress Notes (Signed)
PROGRESS NOTE  Tracey Graham ZOX:[id number] DOB: 11/04/1986 DOA: 10/13/2019 PCP: Clinic, [facility]  HPI/Recap of past 24 hours: Tracey NoeVanessa Tomlinsonis a 33 y.o.femalewith medical history significant forcerebral palsy,wheelchair-bound and fully dependent with ADLs,hydrocephalus s/p VP shunt, seizure disorder, and intellectual disability who presents to the ED for evaluation of fevers and feeling unwell. -In emergency room was febrile to 101.8, white count was 11.3, rest of her electrolytes were unremarkable, urinalysis was negative, chest x-ray was unremarkable, COVID-19 PCR was negative -CT abdomen/pelvis without contrast showed trace lower abdominal pelvic free fluid per radiology may be physiologic or related to chronic VP shunt tubing. No fluid collection, abscess, or acute intra-abdominal or pelvic finding seen. -VP shunt series showed visualized VP shunt catheter intact on skull, chest, and abdominal x-rays. CT head without contrast showed a left frontal approach CSF shunt which appeared stable along with decompressed ventricles since 2014. No acute intracranial abnormality noted. -Subsequent work-up noted left leg DVT, started anticoagulation -slow improvement but low grade fevers persist.  10/17/19: Patient was seen and examined with her mother at her bedside this morning.  Had intermittent burst of crying spells.  Alert and confused.  Appears uncomfortable.  Plan for LP today to rule out aseptic meningitis.  Persistent low-grade fevers with T-max of 99.8 overnight.  10/18/19: Patient was seen and examined with her father at bedside.  She is more alert and smiling.  Her father however thinks that she is about 40-45% back to her baseline.  LP fluid analysis showed lymphocytosis lymphocytosis with no bacteria seen on Gram stain.  HSV PCR and enterovirus PCR pending.    Assessment/Plan: Principal Problem:   Fever of unknown origin (FUO) Active Problems:   Hydrocephalus  (HCC)   VP (ventriculoperitoneal) shunt status   Seizure disorder (HCC)   Fever of unknown origin  Persistent fever of unclear etiology Recently diagnosed with acute DVT of left lower extremity Started full dose anticoagulation subcu Lovenox on 10/14/2019 She continues to have low-grade fevers Other than positive DVT work-up has been unrevealing thus far. Seen by infectious disease, following No leukocytosis Procalcitonin less than 0.10 on 10/14/2019 Lactic acid 1.1 on 10/13/2019 Urine analysis, chest x-ray, right upper quadrant abdominal ultrasound, all benign. LP fluid analysis showed lymphocytosis lymphocytosis with no bacteria seen on Gram stain.  HSV PCR and enterovirus PCR pending.  Acute left lower extremity DVT. Continue anticoagulation Plan subcu Lovenox 75 mg twice daily  Seizure disorder Continue AED  Hypokalemia Potassium 3.4 Repleted with IV 20 mEq  Hydrocephalus s/p chronic VP shunt: VP shunt series showed visualized VP shunt catheter intact on imaging,. CT head without contrast showed a left frontal approach CSF shunt which appeared stable along with decompressed ventricles since 2014.   Dysphagia -Given cerebral palsy, SLP following, continue dysphagia diet  Constipation -continue  MiraLAX and Dulcolax  -BM yesterday  DVT prophylaxis:Lovenox full dose subcu twice daily Code Status:Full code, confirmed with mother Family Communication:Discussed with mother at bedside Disposition Plan:Pending clinical progress  Consultants:   Case d/w NSG 10/17   Procedures:     Objective: Vitals:   10/17/19 2310 10/18/19 0349 10/18/19 0805 10/18/19 1143  BP: (!) 122/45 (!) 116/54 101/61 95/71  Pulse: 99 81 99 86  Resp: 18 18 18 14   Temp: 99 F (37.2 C) 98.2 F (36.8 C) 98.7 F (37.1 C) 98.9 F (37.2 C)  TempSrc: Oral Oral Rectal Axillary  SpO2: 99% 99% 97% 100%  Weight:      Height:  Intake/Output Summary (Last 24 hours) at 10/18/2019  1458 Last data filed at 10/18/2019 0700 Gross per 24 hour  Intake 320 ml  Output 400 ml  Net -80 ml   Filed Weights   10/13/19 1003 10/13/19 2006 10/14/19 0237  Weight: 72.6 kg 74.4 kg 74.7 kg    Exam:  . General: 33 y.o. year-old female well-developed well-nourished in no acute distress.  Alert and smiling. . Cardiovascular: Regular rate and rhythm no rubs or gallops no JVD or thyromegaly noted. Marland Kitchen Respiratory: Clear to auscultation no wheezes or rales.  Poor inspiratory effort. . Abdomen: Soft nontender  normal bowel sounds present. . Musculoskeletal: Trace lower extremity edema. Marland Kitchen Psychiatry: Mood is appropriate for condition and setting.   Data Reviewed: CBC: Recent Labs  Lab 10/13/19 1144 10/14/19 0638 10/16/19 0609 10/17/19 0301 10/18/19 0808  WBC 11.4* 11.8* 9.0 7.7 8.7  NEUTROABS 9.4*  --   --   --   --   HGB 15.4* 13.3 14.7 13.6 14.5  HCT 42.7 36.6 40.9 38.4 41.1  MCV 80.7 79.6* 79.4* 79.8* 80.4  PLT 253 194 225 235 676   Basic Metabolic Panel: Recent Labs  Lab 10/13/19 1144 10/14/19 0638 10/16/19 0609 10/17/19 0301 10/18/19 0808  NA 137 140 137 138 139  K 4.0 3.8 3.6 3.4* 4.4  CL 101 107 101 100 102  CO2 22 22 26 26 27   GLUCOSE 108* 103* 90 87 125*  BUN 5* 7 8 8 11   CREATININE 0.61 0.56 0.54 0.53 0.50  CALCIUM 9.0 8.0* 8.6* 8.5* 8.8*   GFR: Estimated Creatinine Clearance: 99 mL/min (by C-G formula based on SCr of 0.5 mg/dL). Liver Function Tests: Recent Labs  Lab 10/13/19 1144 10/17/19 0301  AST 17 17  ALT 20 18  ALKPHOS 116 86  BILITOT 0.5 0.6  PROT 7.6 6.1*  ALBUMIN 4.0 3.0*   No results for input(s): LIPASE, AMYLASE in the last 168 hours. No results for input(s): AMMONIA in the last 168 hours. Coagulation Profile: Recent Labs  Lab 10/13/19 1144  INR 1.1   Cardiac Enzymes: No results for input(s): CKTOTAL, CKMB, CKMBINDEX, TROPONINI in the last 168 hours. BNP (last 3 results) No results for input(s): PROBNP in the last 8760  hours. HbA1C: No results for input(s): HGBA1C in the last 72 hours. CBG: No results for input(s): GLUCAP in the last 168 hours. Lipid Profile: No results for input(s): CHOL, HDL, LDLCALC, TRIG, CHOLHDL, LDLDIRECT in the last 72 hours. Thyroid Function Tests: No results for input(s): TSH, T4TOTAL, FREET4, T3FREE, THYROIDAB in the last 72 hours. Anemia Panel: No results for input(s): VITAMINB12, FOLATE, FERRITIN, TIBC, IRON, RETICCTPCT in the last 72 hours. Urine analysis:    Component Value Date/Time   COLORURINE YELLOW 10/13/2019 1710   APPEARANCEUR HAZY (A) 10/13/2019 1710   LABSPEC 1.029 10/13/2019 1710   PHURINE 5.0 10/13/2019 1710   GLUCOSEU NEGATIVE 10/13/2019 1710   HGBUR NEGATIVE 10/13/2019 1710   BILIRUBINUR NEGATIVE 10/13/2019 1710   KETONESUR 80 (A) 10/13/2019 1710   PROTEINUR 30 (A) 10/13/2019 1710   UROBILINOGEN 1.0 01/05/2013 2116   NITRITE NEGATIVE 10/13/2019 1710   LEUKOCYTESUR NEGATIVE 10/13/2019 1710   Sepsis Labs: @LABRCNTIP (procalcitonin:4,lacticidven:4)  ) Recent Results (from the past 240 hour(s))  Culture, blood (Routine x 2)     Status: None   Collection Time: 10/13/19  8:55 AM   Specimen: BLOOD  Result Value Ref Range Status   Specimen Description BLOOD LEFT ANTECUBITAL  Final   Special Requests  Final    BOTTLES DRAWN AEROBIC ONLY Blood Culture adequate volume   Culture   Final    NO GROWTH 5 DAYS Performed at [facility] Lab, [address] [place], [place] [address]    Report Status 10/18/2019 FINAL  Final  Culture, blood (Routine x 2)     Status: None   Collection Time: 10/13/19 11:44 AM   Specimen: BLOOD  Result Value Ref Range Status   Specimen Description BLOOD RIGHT ANTECUBITAL  Final   Special Requests   Final    BOTTLES DRAWN AEROBIC AND ANAEROBIC Blood Culture results may not be optimal due to an inadequate volume of blood received in culture bottles   Culture   Final    NO GROWTH 5 DAYS Performed at [facility] Lab,  [address], [place], [place] [address]    Report Status 10/18/2019 FINAL  Final  SARS CORONAVIRUS 2 (TAT 6-24 HRS) Nasopharyngeal Nasopharyngeal Swab     Status: None   Collection Time: 10/13/19  8:05 PM   Specimen: Nasopharyngeal Swab  Result Value Ref Range Status   SARS Coronavirus 2 NEGATIVE NEGATIVE Final    Comment: (NOTE) SARS-CoV-2 target nucleic acids are NOT DETECTED. The SARS-CoV-2 RNA is generally detectable in upper and lower respiratory specimens during the acute phase of infection. Negative results do not preclude SARS-CoV-2 infection, do not rule out co-infections with other pathogens, and should not be used as the sole basis for treatment or other patient management decisions. Negative results must be combined with clinical observations, patient history, and epidemiological information. The expected result is Negative. Fact Sheet for Patients: HairSlick.no Fact Sheet for Healthcare Providers: quierodirigir.com This test is not yet approved or cleared by the [place] FDA and  has been authorized for detection and/or diagnosis of SARS-CoV-2 by FDA under an Emergency Use Authorization (EUA). This EUA will remain  in effect (meaning this test can be used) for the duration of the COVID-19 declaration under Section 56 4(b)(1) of the Act, 21 U.S.C. section 360bbb-3(b)(1), unless the authorization is terminated or revoked sooner. Performed at [facility] Lab, [address] [place], [place] [address]   CSF culture     Status: None (Preliminary result)   Collection Time: 10/17/19 10:05 AM   Specimen: PATH Cytology CSF; Cerebrospinal Fluid  Result Value Ref Range Status   Specimen Description CSF  Final   Special Requests NONE  Final   Gram Stain   Final    WBC PRESENT,BOTH PMN AND MONONUCLEAR NO ORGANISMS SEEN CYTOSPIN SMEAR    Culture   Final    NO GROWTH < 24 HOURS Performed at [facility]  Lab, [address], [place], [place] [address]    Report Status PENDING  Incomplete      Studies: No results found.  Scheduled Meds: . enoxaparin (LOVENOX) injection  75 mg Subcutaneous Q12H  . PHENObarbital  97.2 mg Intravenous QHS  . polyethylene glycol  17 g Oral BID    Continuous Infusions:   LOS: 4 days     Darlin Drop, MD [facility] Pager [phone]  If 7PM-7AM, please contact night-coverage [email or web address] Password [email or web address] 10/18/2019, 2:58 PM

## 2019-10-18 NOTE — Procedures (Signed)
LP performed at L4-5 interspace with fluoroscopic guidance.  7 cc of clear fluid returned, mildly blood tinged at the end of procedure.  No immediate complications noted. Please see dictated note for details.

## 2019-10-18 NOTE — Progress Notes (Signed)
[place] for Infect[facility] Disease  Date of Admission:  10/13/2019     Total days of antibiotics 0         ASSESSMENT:  Tracey Graham has symptoms that are consistent with aseptic meningitis with CSF showing pleocytosis, elevated protein, and lymphocytic predominance. Clinically she appears to be improving slowly. CSF cultures are without growth to date. Awaiting remaining serological results. Discussed that antibiotics are not indicated at the present time and will await further serologies to determine if any additional treatments are indicated.    PLAN:  1. Continue to monitor. 2. Await serology results.  3. DVT treatment per primary team   Principal Problem:   Fever of unknown origin (FUO) Active Problems:   Hydrocephalus ([place])   VP (ventriculoperitoneal) shunt status   Seizure disorder (HCC)   Fever of unknown origin   . enoxaparin (LOVENOX) injection  75 mg Subcutaneous Q12H  . PHENObarbital  97.2 mg Intravenous QHS  . polyethylene glycol  17 g Oral BID    SUBJECTIVE:  Afebrile overnight with no acute events. Father indicates she appears to be about 30% back to baseline. She is interactive at times and responds to father's questions.   Allergies  Allergen Reactions  . Penicillins Swelling    Did not have reaction to 5d of amoxicillin Has patient had a PCN reaction causing immediate rash, facial/tongue/throat swelling, SOB or lightheadedness with hypotension: Yes Has patient had a PCN reaction causing severe rash involving mucus membranes or skin necrosis: No Has patient had a PCN reaction that required hospitalization No Has patient had a PCN reaction occurring within the last 10 years: No If all of the above answers are "NO", then may proceed with Cephalosporin use.      Review of Systems: Review of Systems  Unable to perform ROS: Mental status change      OBJECTIVE: Vitals:   10/17/19 2310 10/18/19 0349 10/18/19 0805 10/18/19 1143  BP: (!)  122/45 (!) 116/54 101/61 95/71  Pulse: 99 81 99 86  Resp: 18 18 18 14   Temp: 99 F (37.2 C) 98.2 F (36.8 C) 98.7 F (37.1 C) 98.9 F (37.2 C)  TempSrc: Oral Oral Rectal Axillary  SpO2: 99% 99% 97% 100%  Weight:      Height:       Body mass index is 28.27 kg/m.  Physical Exam Constitutional:      General: She is not in acute distress.    Comments: Lying in bed with head of bed elevated; resting and lethargic  Cardiovascular:     Rate and Rhythm: Normal rate and regular rhythm.     Heart sounds: Normal heart sounds.  Pulmonary:     Effort: Pulmonary effort is normal.     Breath sounds: Normal breath sounds. No wheezing or rales.  Skin:    General: Skin is warm and dry.  Psychiatric:        Thought Content: Thought content normal.        Judgment: Judgment normal.     Lab Results Lab Results  Component Value Date   WBC 8.7 10/18/2019   HGB 14.5 10/18/2019   HCT 41.1 10/18/2019   MCV 80.4 10/18/2019   PLT 233 10/18/2019    Lab Results  Component Value Date   CREATININE 0.50 10/18/2019   BUN 11 10/18/2019   NA 139 10/18/2019   K 4.4 10/18/2019   CL 102 10/18/2019   CO2 27 10/18/2019    Lab Results  Component  Value Date   ALT 18 10/17/2019   AST 17 10/17/2019   ALKPHOS 86 10/17/2019   BILITOT 0.6 10/17/2019     Microbiology: Recent Results (from the past 240 hour(s))  Culture, blood (Routine x 2)     Status: None   Collection Time: 10/13/19  8:55 AM   Specimen: BLOOD  Result Value Ref Range Status   Specimen Description BLOOD LEFT ANTECUBITAL  Final   Special Requests   Final    BOTTLES DRAWN AEROBIC ONLY Blood Culture adequate volume   Culture   Final    NO GROWTH 5 DAYS Performed at [facility] Lab, [address], [place], [place] [address]    Report Status 10/18/2019 FINAL  Final  Culture, blood (Routine x 2)     Status: None   Collection Time: 10/13/19 11:44 AM   Specimen: BLOOD  Result Value Ref Range Status   Specimen Description  BLOOD RIGHT ANTECUBITAL  Final   Special Requests   Final    BOTTLES DRAWN AEROBIC AND ANAEROBIC Blood Culture results may not be optimal due to an inadequate volume of blood received in culture bottles   Culture   Final    NO GROWTH 5 DAYS Performed at [facility] Lab, [address] [place], [place] [address]    Report Status 10/18/2019 FINAL  Final  SARS CORONAVIRUS 2 (TAT 6-24 HRS) Nasopharyngeal Nasopharyngeal Swab     Status: None   Collection Time: 10/13/19  8:05 PM   Specimen: Nasopharyngeal Swab  Result Value Ref Range Status   SARS Coronavirus 2 NEGATIVE NEGATIVE Final    Comment: (NOTE) SARS-CoV-2 target nucleic acids are NOT DETECTED. The SARS-CoV-2 RNA is generally detectable in upper and lower respiratory specimens during the acute phase of infection. Negative results do not preclude SARS-CoV-2 infection, do not rule out co-infections with other pathogens, and should not be used as the sole basis for treatment or other patient management decisions. Negative results must be combined with clinical observations, patient history, and epidemiological information. The expected result is Negative. Fact Sheet for Patients: HairSlick.no Fact Sheet for Healthcare Providers: quierodirigir.com This test is not yet approved or cleared by the [place] FDA and  has been authorized for detection and/or diagnosis of SARS-CoV-2 by FDA under an Emergency Use Authorization (EUA). This EUA will remain  in effect (meaning this test can be used) for the duration of the COVID-19 declaration under Section 56 4(b)(1) of the Act, 21 U.S.C. section 360bbb-3(b)(1), unless the authorization is terminated or revoked sooner. Performed at [facility] Lab, [address], [place], [place] [address]   CSF culture     Status: None (Preliminary result)   Collection Time: 10/17/19 10:05 AM   Specimen: PATH Cytology CSF; Cerebrospinal  Fluid  Result Value Ref Range Status   Specimen Description CSF  Final   Special Requests NONE  Final   Gram Stain   Final    WBC PRESENT,BOTH PMN AND MONONUCLEAR NO ORGANISMS SEEN CYTOSPIN SMEAR    Culture   Final    NO GROWTH < 24 HOURS Performed at [facility] Lab, [address] [place], [place] [address]    Report Status PENDING  Incomplete     Marcos Eke, NP [facility] [phone] Pager  10/18/2019  3:55 PM

## 2019-10-18 NOTE — Progress Notes (Addendum)
  Speech Language Pathology    Patient Details Name: Tracey Graham MRN: [id number] DOB: October 22, 1986 Today's Date: 10/18/2019 Time:  -     Had been following intermittently for swallow- aspiration was not suspected. Pt's with CP can be at greater aspiration risk. Has had decreased intake; signs of abdominal pain. Reviewed chart for results of LP which revealed aseptic meningitis. Hopeful that intake will improve as meningitis does. ST will sign off.              [name] 10/18/2019, 2:25 PM   [name] Colvin Caroli.Ed Risk analyst [phone] Office [phone]

## 2019-10-18 NOTE — Progress Notes (Addendum)
Late Entry Bedside Swallow Assessment    10/14/19 0856  SLP Visit Information  SLP Received On 10/14/19  General Information  HPI Tracey Graham is a 33 y.o. female with medical history significant for cerebral palsy, wheelchair-bound and fully dependent with ADLs, hydrocephalus s/p VP shunt, seizure disorder, and intellectual disability who presents to the ED for evaluation of fevers and abdominal pain.  Per chart mother thinks that she has been having some nausea without emesis and has a history of dysphagia and does choke on food on occasion.  CXR Stable appearance of the chest, with no active cardiopulmonary disease identified  Type of Study Bedside Swallow Evaluation  Previous Swallow Assessment  (none found)  Diet Prior to this Study NPO  Temperature Spikes Noted Yes  Respiratory Status Room air  History of Recent Intubation No  Behavior/Cognition Alert;Other (Comment);Requires cueing;Distractible (intermittently crying )  Oral Cavity Assessment Other (comment) (unable for full view)  Oral Care Completed by SLP No  Oral Cavity - Dentition Adequate natural dentition  Self-Feeding Abilities Total assist  Patient Positioning Upright in bed  Baseline Vocal Quality Normal  Pain Assessment  Pain Assessment Faces  Faces Pain Scale 8  Pain Intervention(s) Monitored during session  Oral Assessment (Complete on admission/transfer/change in patient condition)  Does patient have any of the following "high(er) risk" factors? Nutritional status - fluids only or NPO for >24 hours  Patient is HIGH RISK: Non-ventilated Order set for Adult Oral Care Protocol initiated - "High Risk Patients - Non-Ventilated" option selected  (see row information)  Oral Motor/Sensory Function  Overall Oral Motor/Sensory Function Other (comment) (for cerebral palsy appeared functional )  Ice Chips  Ice chips NT  Thin Liquid  Thin Liquid WFL  Presentation Straw  Nectar Thick Liquid  Nectar Thick Liquid NT   Honey Thick Liquid  Honey Thick Liquid NT  Puree  Puree WFL  Solid  Solid NT  SLP - End of Session  Patient left in bed;with family/visitor present  Nurse Communication Diet recommendation;Treatment plan;Swallow strategies reviewed  SLP Assessment  Clinical Impression Statement (ACUTE ONLY) Mom present and endorses dysphagia marked by inadequate mastication with premature oral tranist, "audible sound" when swallowing over past 3-4 months and "coughing sometimes with meals" which can by typical in pt's with cerebral palsy. Dentition is intact and observation of oral-motor symmetry and strength at rest reveals mild deficits however not significant as would be expected (no saliva leakage present). She is moaning/crying, appears to be in pain and resistant initially to puree. Consumed 3 bites pudding and sips water via straw (delayed initiation suspect due decreased desire). Audible swallow not unusual and can be sign of discoordination in pt's with CP, No cough, throat clear and vocal quality clear. She does not have audible pharyngeal or chest congestion at baseline and clear CXR. Results/recommendations discussed with pt's mom and MD - aspiration is not presently suspected however risk can he higher in this population. Recommend initiate Dys 1 (puree), thin liquids, crush meds, full assist after testing that requires NPO status. ST will follow up for safety and make necessary recommendations for instrumental assessment if neeeded.      SLP Visit Diagnosis Dysphagia, unspecified (R13.10)  Impact on safety and function Mild aspiration risk  Other Related Risk Factors Cognitive impairment  Swallow Evaluation Recommendations  SLP Diet Recommendations Thin liquid;Dysphagia 1 (Puree)  Liquid Administration via Straw  Medication Administration Crushed with puree  Supervision Staff to assist with self feeding;Full supervision/cueing for compensatory strategies  Compensations  Minimize environmental  distractions;Slow rate  Postural Changes Seated upright at 90 degrees  Treatment Plan  Oral Care Recommendations Oral care BID  Treatment Recommendations Therapy as outlined in treatment plan below  Follow up Recommendations Other (comment) (TBD)  Speech Therapy Frequency (ACUTE ONLY) min 2x/week  Treatment Duration 2 weeks  Interventions Patient/family education;Trials of upgraded texture/liquids;Diet toleration management by SLP  Prognosis  Prognosis for Safe Diet Advancement Good  Barriers to Reach Goals Cognitive deficits  Individuals Consulted  Consulted and Agree with Results and Recommendations Family member/caregiver;RN  Family Member Consulted mom  SLP Time Calculation  SLP Start Time (ACUTE ONLY) 0857  SLP Stop Time (ACUTE ONLY) 0925  SLP Time Calculation (min) (ACUTE ONLY) 28 min  SLP Evaluations  $ SLP Speech Visit 1 Visit  SLP Evaluations  $BSS Swallow 1 Procedure  Breck Coons [place] M.Ed Nurse, children's [phone] Office [phone]

## 2019-10-19 DIAGNOSIS — K5900 Constipation, unspecified: Secondary | ICD-10-CM

## 2019-10-19 DIAGNOSIS — R519 Headache, unspecified: Secondary | ICD-10-CM

## 2019-10-19 DIAGNOSIS — M542 Cervicalgia: Secondary | ICD-10-CM | POA: Diagnosis not present

## 2019-10-19 DIAGNOSIS — I82402 Acute embolism and thrombosis of unspecified deep veins of left lower extremity: Secondary | ICD-10-CM | POA: Diagnosis not present

## 2019-10-19 LAB — VDRL, CSF: VDRL Quant, CSF: NONREACTIVE

## 2019-10-19 MED ORDER — KETOROLAC TROMETHAMINE 30 MG/ML IJ SOLN
30.0000 mg | Freq: Once | INTRAMUSCULAR | Status: AC
  Administered 2019-10-19: 21:00:00 30 mg via INTRAVENOUS
  Filled 2019-10-19: qty 1

## 2019-10-19 MED ORDER — DEXAMETHASONE 4 MG PO TABS
4.0000 mg | ORAL_TABLET | Freq: Four times a day (QID) | ORAL | Status: AC
  Administered 2019-10-19 – 2019-10-22 (×12): 4 mg via ORAL
  Filled 2019-10-19 (×11): qty 1

## 2019-10-19 MED ORDER — BISACODYL 10 MG RE SUPP
10.0000 mg | Freq: Once | RECTAL | Status: AC
  Administered 2019-10-19: 10 mg via RECTAL
  Filled 2019-10-19: qty 1

## 2019-10-19 MED ORDER — SENNOSIDES-DOCUSATE SODIUM 8.6-50 MG PO TABS
2.0000 | ORAL_TABLET | Freq: Two times a day (BID) | ORAL | Status: DC
  Administered 2019-10-19 – 2019-10-23 (×7): 2 via ORAL
  Filled 2019-10-19 (×9): qty 2

## 2019-10-19 MED ORDER — ACETAMINOPHEN 325 MG PO TABS
650.0000 mg | ORAL_TABLET | ORAL | Status: DC | PRN
  Administered 2019-10-19 – 2019-10-22 (×10): 650 mg via ORAL
  Filled 2019-10-19 (×10): qty 2

## 2019-10-19 NOTE — Consult Note (Signed)
Subjective: Patient seen at the bedside on rounds this morning.  Mother is at the bedside.  Patient is laying flat on her belly, mother says this is usually how she sleeps at home.  Mother notes that she thinks she is still having headaches and neck pain.  Notices that she appears to be more comfortable while laying down.  Antibiotics:  Anti-infectives (From admission, onward)   None     Medications: Scheduled Meds: . enoxaparin (LOVENOX) injection  75 mg Subcutaneous Q12H  . PHENObarbital  97.2 mg Intravenous QHS  . phenobarbital  97.2 mg Oral Once  . polyethylene glycol  17 g Oral BID  . senna-docusate  2 tablet Oral BID   Continuous Infusions: PRN Meds:.acetaminophen, ondansetron **OR** ondansetron (ZOFRAN) IV, sodium chloride flush  Objective: Weight change:   Intake/Output Summary (Last 24 hours) at 10/19/2019 1305 Last data filed at 10/19/2019 1100 Gross per 24 hour  Intake 120 ml  Output 401 ml  Net -281 ml   Blood pressure 123/81, pulse (!) 105, temperature 99.1 F (37.3 C), temperature source Oral, resp. rate 18, height 5\' 4"  (1.626 m), weight 74.7 kg, last menstrual period 10/01/2019, SpO2 100 %. Temp:  [98.1 F (36.7 C)-100.3 F (37.9 C)] 99.1 F (37.3 C) (10/23 1158) Pulse Rate:  [82-105] 105 (10/23 1158) Resp:  [17-20] 18 (10/23 1158) BP: (119-137)/(54-113) 123/81 (10/23 1158) SpO2:  [60 %-100 %] 100 % (10/23 1158)  Physical Exam: General: Lying in bed, appears uncomfortable HEENT: NCAT CV: Warm and well-perfused, distal pulses intact Pulmonary: Clear to auscultation bilaterally Neuro: Alert, no focal deficits  CBC: CBC Latest Ref Rng & Units 10/18/2019 10/17/2019 10/16/2019  WBC 4.0 - 10.5 K/uL 8.7 7.7 9.0  Hemoglobin 12.0 - 15.0 g/dL 14.5 13.6 14.7  Hematocrit 36.0 - 46.0 % 41.1 38.4 40.9  Platelets 150 - 400 K/uL 233 235 225   BMET Recent Labs    10/17/19 0301 10/18/19 0808  NA 138 139  K 3.4* 4.4  CL 100 102  CO2 26 27   GLUCOSE 87 125*  BUN 8 11  CREATININE 0.53 0.50  CALCIUM 8.5* 8.8*   Liver Panel Recent Labs    10/17/19 0301  PROT 6.1*  ALBUMIN 3.0*  AST 17  ALT 18  ALKPHOS 86  BILITOT 0.6   Micro Results: Recent Results (from the past 720 hour(s))  Culture, blood (Routine x 2)     Status: None   Collection Time: 10/13/19  8:55 AM   Specimen: BLOOD  Result Value Ref Range Status   Specimen Description BLOOD LEFT ANTECUBITAL  Final   Special Requests   Final    BOTTLES DRAWN AEROBIC ONLY Blood Culture adequate volume   Culture   Final    NO GROWTH 5 DAYS Performed at [facility], [place] [address] [place], [place] [address]    Report Status 10/18/2019 FINAL  Final  Culture, blood (Routine x 2)     Status: None   Collection Time: 10/13/19 11:44 AM   Specimen: BLOOD  Result Value Ref Range Status   Specimen Description BLOOD RIGHT ANTECUBITAL  Final   Special Requests   Final    BOTTLES DRAWN AEROBIC AND ANAEROBIC Blood Culture results may not be optimal due to an inadequate volume of blood received in culture bottles   Culture   Final    NO GROWTH 5 DAYS Performed at [facility], [place] [address], [place], [place] [address]  Report Status 10/18/2019 FINAL  Final  SARS CORONAVIRUS 2 (TAT 6-24 HRS) Nasopharyngeal Nasopharyngeal Swab     Status: None   Collection Time: 10/13/19  8:05 PM   Specimen: Nasopharyngeal Swab  Result Value Ref Range Status   SARS Coronavirus 2 NEGATIVE NEGATIVE Final    Comment: (NOTE) SARS-CoV-2 target nucleic acids are NOT DETECTED. The SARS-CoV-2 RNA is generally detectable in upper and lower respiratory specimens during the acute phase of infection. Negative results do not preclude SARS-CoV-2 infection, do not rule out co-infections with other pathogens, and should not be used as the sole basis for treatment or other patient management decisions. Negative results must be combined with clinical observations, patient history, and  epidemiological information. The expected result is Negative. Fact Sheet for Patients: HairSlick.no Fact Sheet for Healthcare Providers: quierodirigir.com This test is not yet approved or cleared by the [place] FDA and  has been authorized for detection and/or diagnosis of SARS-CoV-2 by FDA under an Emergency Use Authorization (EUA). This EUA will remain  in effect (meaning this test can be used) for the duration of the COVID-19 declaration under Section 56 4(b)(1) of the Act, 21 U.S.C. section 360bbb-3(b)(1), unless the authorization is terminated or revoked sooner. Performed at [facility], [address] [place], [place] [address]   Anaerobic culture     Status: None (Preliminary result)   Collection Time: 10/17/19 10:05 AM   Specimen: PATH Cytology CSF; Cerebrospinal Fluid  Result Value Ref Range Status   Specimen Description CSF  Final   Special Requests   Final    NONE Performed at [facility], [address], [place], [place] [address]    Culture   Final    NO ANAEROBES ISOLATED; CULTURE IN PROGRESS FOR 5 DAYS   Report Status PENDING  Incomplete  CSF culture     Status: None (Preliminary result)   Collection Time: 10/17/19 10:05 AM   Specimen: PATH Cytology CSF; Cerebrospinal Fluid  Result Value Ref Range Status   Specimen Description CSF  Final   Special Requests NONE  Final   Gram Stain   Final    WBC PRESENT,BOTH PMN AND MONONUCLEAR NO ORGANISMS SEEN CYTOSPIN SMEAR    Culture   Final    NO GROWTH 2 DAYS Performed at [facility], [address], [place], [place] [address]    Report Status PENDING  Incomplete   Assessment/Plan: Principal Problem:   Fever of unknown origin (FUO) Active Problems:   Hydrocephalus (HCC)   VP (ventriculoperitoneal) shunt status   Seizure disorder (HCC)   Fever of unknown origin  Shelagh Rayman is a 33 y.o. female with past medical history  significant for cerebral palsy, intellectual disability, and hydrocephalus status post VP shunt who presented with fevers.  She was discovered to have a left lower extremity DVT.  CSF studies demonstrate pleocytosis, elevated protein, and lymphocytic predominance.  She appears to be improving slowly, however CSF cultures and serology studies are still pending.  1.  Follow-up serology studies 2.  Continue therapeutic Lovenox for DVT 3.  Given patient's continued uncomfortable appearance and what appears to be neck pain described by the mother, I suspect the patient may benefit from steroids. Will order dexamethasone 4 mg every 6 hours for 3 days. 4.  Scheduled Tylenol 650 mg every 4 hours   LOS: 5 days   Kirt Boys, MD Internal Medicine, PGY1 Pager: [phone]  10/19/2019,1:21 PM

## 2019-10-19 NOTE — Progress Notes (Addendum)
PROGRESS NOTE  Tracey Graham WLS:[id number] DOB: November 16, 1986 DOA: 10/13/2019 PCP: Clinic, General Medical  HPI/Recap of past 24 hours: Tracey Tomlinsonis a 33 y.o.femalewith medical history significant forcerebral palsy,wheelchair-bound and fully dependent with ADLs,hydrocephalus s/p VP shunt, seizure disorder, and intellectual disability who presents to the ED for evaluation of fevers and feeling unwell. -In emergency room was febrile to 101.8, white count was 11.3, rest of her electrolytes were unremarkable, urinalysis was negative, chest x-ray was unremarkable, COVID-19 PCR was negative -CT abdomen/pelvis without contrast showed trace lower abdominal pelvic free fluid per radiology may be physiologic or related to chronic VP shunt tubing. No fluid collection, abscess, or acute intra-abdominal or pelvic finding seen. -VP shunt series showed visualized VP shunt catheter intact on skull, chest, and abdominal x-rays. CT head without contrast showed a left frontal approach CSF shunt which appeared stable along with decompressed ventricles since 2014. No acute intracranial abnormality noted. -Subsequent work-up noted left leg DVT, started anticoagulation -slow improvement but low grade fevers persist.  10/17/19: Patient was seen and examined with her mother at her bedside this morning.  Had intermittent burst of crying spells.  Alert and confused.  Appears uncomfortable.  Plan for LP today to rule out aseptic meningitis.  Persistent low-grade fevers with T-max of 99.8 overnight.  10/18/19: Patient was seen and examined with her father at bedside.  She is more alert and smiling.  Her father however thinks that she is about 40-45% back to her baseline.  LP fluid analysis showed lymphocytosis lymphocytosis with no bacteria seen on Gram stain.  HSV PCR and enterovirus PCR pending.  10/19/19: Patient was seen and examined at bedside.  Her mother present.  Per her mother she is uncomfortable.   Fever overnight with T-max of 100.3.  Obtain blood cultures x2 peripherally.  Started on po steroids 4 mg dexamethasone every 6 hours for 3 days by ID and scheduled Tylenol 650 mg every 4 hours..    Assessment/Plan: Principal Problem:   Fever of unknown origin (FUO) Active Problems:   Hydrocephalus (HCC)   VP (ventriculoperitoneal) shunt status   Seizure disorder (HCC)   Fever of unknown origin  Persistent fever of unclear etiology Recently diagnosed with acute DVT of left lower extremity Started full dose anticoagulation subcu Lovenox on 10/14/2019 She continues to have low-grade fevers Other than positive DVT work-up has been unrevealing thus far. Seen by infectious disease, following No leukocytosis Procalcitonin less than 0.10 on 10/14/2019 Lactic acid 1.1 on 10/13/2019 Urine analysis, chest x-ray, right upper quadrant abdominal ultrasound, all benign. LP fluid analysis showed lymphocytosis lymphocytosis with no bacteria seen on Gram stain.  HSV PCR and enterovirus PCR pending. T-max 100.3 overnight Blood cultures x2 peripherally ordered on 10/19/2019, pending results Started on Decadron 4 mg every 6 hours x3 days and scheduled Tylenol 650 mg every 4 hours  Acute left lower extremity DVT. Continue anticoagulation Continue subcu Lovenox 75 mg twice daily  Seizure disorder Continue AED  Hypokalemia Potassium 3.4 Repleted with IV 20 mEq  Hydrocephalus s/p chronic VP shunt: VP shunt series showed visualized VP shunt catheter intact on imaging,. CT head without contrast showed a left frontal approach CSF shunt which appeared stable along with decompressed ventricles since 2014.   Dysphagia -Given cerebral palsy, SLP following, continue dysphagia diet  Constipation -continue  MiraLAX and Dulcolax  -BM yesterday  DVT prophylaxis:Lovenox full dose subcu twice daily Code Status:Full code, confirmed with mother Family Communication:Discussed with mother at bedside  Disposition Plan:Pending clinical progress  Consultants:   Case d/w NSG 10/17   Procedures:     Objective: Vitals:   10/18/19 2343 10/19/19 0425 10/19/19 0833 10/19/19 1158  BP: 123/72 (!) 119/54 124/83 123/81  Pulse: (!) 102 82 (!) 103 (!) 105  Resp: 19  18 18   Temp: 98.1 F (36.7 C) 98.7 F (37.1 C) 98.3 F (36.8 C) 99.1 F (37.3 C)  TempSrc: Oral Oral Oral Oral  SpO2: 100% 100% 96% 100%  Weight:      Height:        Intake/Output Summary (Last 24 hours) at 10/19/2019 1358 Last data filed at 10/19/2019 1100 Gross per 24 hour  Intake 120 ml  Output 401 ml  Net -281 ml   Filed Weights   10/13/19 1003 10/13/19 2006 10/14/19 0237  Weight: 72.6 kg 74.4 kg 74.7 kg    Exam:  . General: 33 y.o. year-old female well-developed well-nourished appears uncomfortable laying in bed.   . Cardiovascular: Regular rate and rhythm no rubs or gallops no JVD or thyromegaly noted.   Marland Kitchen. Respiratory: Clear to station no wheezes or rales.  Poor inspiratory effort.   . Abdomen: Soft nontender nondistended normal bowel sounds present. . Musculoskeletal: Trace lower extremity edema bilaterally. Marland Kitchen. Psychiatry: Unable to assess mood due discomfort.   Data Reviewed: CBC: Recent Labs  Lab 10/13/19 1144 10/14/19 0638 10/16/19 0609 10/17/19 0301 10/18/19 0808  WBC 11.4* 11.8* 9.0 7.7 8.7  NEUTROABS 9.4*  --   --   --   --   HGB 15.4* 13.3 14.7 13.6 14.5  HCT 42.7 36.6 40.9 38.4 41.1  MCV 80.7 79.6* 79.4* 79.8* 80.4  PLT 253 194 225 235 233   Basic Metabolic Panel: Recent Labs  Lab 10/13/19 1144 10/14/19 0638 10/16/19 0609 10/17/19 0301 10/18/19 0808  NA 137 140 137 138 139  K 4.0 3.8 3.6 3.4* 4.4  CL 101 107 101 100 102  CO2 22 22 26 26 27   GLUCOSE 108* 103* 90 87 125*  BUN 5* 7 8 8 11   CREATININE 0.61 0.56 0.54 0.53 0.50  CALCIUM 9.0 8.0* 8.6* 8.5* 8.8*   GFR: Estimated Creatinine Clearance: 99 mL/min (by C-G formula based on SCr of 0.5 mg/dL). Liver Function  Tests: Recent Labs  Lab 10/13/19 1144 10/17/19 0301  AST 17 17  ALT 20 18  ALKPHOS 116 86  BILITOT 0.5 0.6  PROT 7.6 6.1*  ALBUMIN 4.0 3.0*   No results for input(s): LIPASE, AMYLASE in the last 168 hours. No results for input(s): AMMONIA in the last 168 hours. Coagulation Profile: Recent Labs  Lab 10/13/19 1144  INR 1.1   Cardiac Enzymes: No results for input(s): CKTOTAL, CKMB, CKMBINDEX, TROPONINI in the last 168 hours. BNP (last 3 results) No results for input(s): PROBNP in the last 8760 hours. HbA1C: No results for input(s): HGBA1C in the last 72 hours. CBG: No results for input(s): GLUCAP in the last 168 hours. Lipid Profile: No results for input(s): CHOL, HDL, LDLCALC, TRIG, CHOLHDL, LDLDIRECT in the last 72 hours. Thyroid Function Tests: No results for input(s): TSH, T4TOTAL, FREET4, T3FREE, THYROIDAB in the last 72 hours. Anemia Panel: No results for input(s): VITAMINB12, FOLATE, FERRITIN, TIBC, IRON, RETICCTPCT in the last 72 hours. Urine analysis:    Component Value Date/Time   COLORURINE YELLOW 10/13/2019 1710   APPEARANCEUR HAZY (A) 10/13/2019 1710   LABSPEC 1.029 10/13/2019 1710   PHURINE 5.0 10/13/2019 1710   GLUCOSEU NEGATIVE 10/13/2019 1710   HGBUR NEGATIVE 10/13/2019 1710  BILIRUBINUR NEGATIVE 10/13/2019 1710   KETONESUR 80 (A) 10/13/2019 1710   PROTEINUR 30 (A) 10/13/2019 1710   UROBILINOGEN 1.0 01/05/2013 2116   NITRITE NEGATIVE 10/13/2019 1710   LEUKOCYTESUR NEGATIVE 10/13/2019 1710   Sepsis Labs: @LABRCNTIP (procalcitonin:4,lacticidven:4)  ) Recent Results (from the past 240 hour(s))  Culture, blood (Routine x 2)     Status: None   Collection Time: 10/13/19  8:55 AM   Specimen: BLOOD  Result Value Ref Range Status   Specimen Description BLOOD LEFT ANTECUBITAL  Final   Special Requests   Final    BOTTLES DRAWN AEROBIC ONLY Blood Culture adequate volume   Culture   Final    NO GROWTH 5 DAYS Performed at [facility], [address], [place], [place]    Report Status 10/18/2019 FINAL  Final  Culture, blood (Routine x 2)     Status: None   Collection Time: 10/13/19 11:44 AM   Specimen: BLOOD  Result Value Ref Range Status   Specimen Description BLOOD RIGHT ANTECUBITAL  Final   Special Requests   Final    BOTTLES DRAWN AEROBIC AND ANAEROBIC Blood Culture results may not be optimal due to an inadequate volume of blood received in culture bottles   Culture   Final    NO GROWTH 5 DAYS Performed at [facility], [address] [place], [place]    Report Status 10/18/2019 FINAL  Final  SARS CORONAVIRUS 2 (TAT 6-24 HRS) Nasopharyngeal Nasopharyngeal Swab     Status: None   Collection Time: 10/13/19  8:05 PM   Specimen: Nasopharyngeal Swab  Result Value Ref Range Status   SARS Coronavirus 2 NEGATIVE NEGATIVE Final    Comment: (NOTE) SARS-CoV-2 target nucleic acids are NOT DETECTED. The SARS-CoV-2 RNA is generally detectable in upper and lower respiratory specimens during the acute phase of infection. Negative results do not preclude SARS-CoV-2 infection, do not rule out co-infections with other pathogens, and should not be used as the sole basis for treatment or other patient management decisions. Negative results must be combined with clinical observations, patient history, and epidemiological information. The expected result is Negative. Fact Sheet for Patients: 10/15/19 Fact Sheet for Healthcare Providers: HairSlick.no This test is not yet approved or cleared by the [place] FDA and  has been authorized for detection and/or diagnosis of SARS-CoV-2 by FDA under an Emergency Use Authorization (EUA). This EUA will remain  in effect (meaning this test can be used) for the duration of the COVID-19 declaration under Section 56 4(b)(1) of the Act, 21 U.S.C. section 360bbb-3(b)(1), unless the authorization is terminated  or revoked sooner. Performed at [facility], [address] [place], [place]   Anaerobic culture     Status: None (Preliminary result)   Collection Time: 10/17/19 10:05 AM   Specimen: PATH Cytology CSF; Cerebrospinal Fluid  Result Value Ref Range Status   Specimen Description CSF  Final   Special Requests   Final    NONE Performed at [facility], [address] [place], [place]    Culture   Final    NO ANAEROBES ISOLATED; CULTURE IN PROGRESS FOR 5 DAYS   Report Status PENDING  Incomplete  CSF culture     Status: None (Preliminary result)   Collection Time: 10/17/19 10:05 AM   Specimen: PATH Cytology CSF; Cerebrospinal Fluid  Result Value Ref Range Status   Specimen Description CSF  Final   Special Requests  NONE  Final   Gram Stain   Final    WBC PRESENT,BOTH PMN AND MONONUCLEAR NO ORGANISMS SEEN CYTOSPIN SMEAR    Culture   Final    NO GROWTH 2 DAYS Performed at [facility], [address], [place], [place] [address]    Report Status PENDING  Incomplete      Studies: No results found.  Scheduled Meds: . dexamethasone  4 mg Oral Q6H  . enoxaparin (LOVENOX) injection  75 mg Subcutaneous Q12H  . PHENObarbital  97.2 mg Intravenous QHS  . phenobarbital  97.2 mg Oral Once  . polyethylene glycol  17 g Oral BID  . senna-docusate  2 tablet Oral BID    Continuous Infusions:   LOS: 5 days     Darlin Drop, MD [facility] Pager [phone]  If 7PM-7AM, please contact night-coverage [email or web address] Password [email or web address] 10/19/2019, 1:58 PM

## 2019-10-20 DIAGNOSIS — G40909 Epilepsy, unspecified, not intractable, without status epilepticus: Secondary | ICD-10-CM

## 2019-10-20 DIAGNOSIS — R509 Fever, unspecified: Secondary | ICD-10-CM | POA: Diagnosis not present

## 2019-10-20 DIAGNOSIS — I82409 Acute embolism and thrombosis of unspecified deep veins of unspecified lower extremity: Secondary | ICD-10-CM | POA: Diagnosis not present

## 2019-10-20 DIAGNOSIS — G030 Nonpyogenic meningitis: Secondary | ICD-10-CM | POA: Diagnosis not present

## 2019-10-20 LAB — BASIC METABOLIC PANEL
Anion gap: 7 (ref 5–15)
BUN: 8 mg/dL (ref 6–20)
CO2: 26 mmol/L (ref 22–32)
Calcium: 8.4 mg/dL — ABNORMAL LOW (ref 8.9–10.3)
Chloride: 102 mmol/L (ref 98–111)
Creatinine, Ser: 0.56 mg/dL (ref 0.44–1.00)
GFR calc Af Amer: >60 mL/min (ref >60)
GFR calc non Af Amer: >60 mL/min (ref >60)
Glucose, Bld: 126 mg/dL — ABNORMAL HIGH (ref 70–99)
Potassium: 4.3 mmol/L (ref 3.5–5.1)
Sodium: 135 mmol/L (ref 135–145)

## 2019-10-20 LAB — CBC WITH DIFFERENTIAL/PLATELET
Abs Immature Granulocytes: 0.07 10*3/uL (ref 0.00–0.07)
Basophils Absolute: 0 10*3/uL (ref 0.0–0.1)
Basophils Relative: 0 %
Eosinophils Absolute: 0 10*3/uL (ref 0.0–0.5)
Eosinophils Relative: 0 %
HCT: 35.8 % — ABNORMAL LOW (ref 36.0–46.0)
Hemoglobin: 12.9 g/dL (ref 12.0–15.0)
Immature Granulocytes: 1 %
Lymphocytes Relative: 9 %
Lymphs Abs: 1.1 10*3/uL (ref 0.7–4.0)
MCH: 28.5 pg (ref 26.0–34.0)
MCHC: 36 g/dL (ref 30.0–36.0)
MCV: 79.2 fL — ABNORMAL LOW (ref 80.0–100.0)
Monocytes Absolute: 0.5 10*3/uL (ref 0.1–1.0)
Monocytes Relative: 4 %
Neutro Abs: 9.8 10*3/uL — ABNORMAL HIGH (ref 1.7–7.7)
Neutrophils Relative %: 86 %
Platelets: 240 10*3/uL (ref 150–400)
RBC: 4.52 MIL/uL (ref 3.87–5.11)
RDW: 13.4 % (ref 11.5–15.5)
WBC: 11.5 10*3/uL — ABNORMAL HIGH (ref 4.0–10.5)
nRBC: 0 % (ref 0.0–0.2)

## 2019-10-20 LAB — LACTIC ACID, PLASMA: Lactic Acid, Venous: 0.9 mmol/L (ref 0.5–1.9)

## 2019-10-20 LAB — CSF CULTURE W GRAM STAIN: Culture: NO GROWTH

## 2019-10-20 LAB — HSV DNA BY PCR (REFERENCE LAB)
HSV 1 DNA: NEGATIVE
HSV 2 DNA: NEGATIVE

## 2019-10-20 MED ORDER — SODIUM CHLORIDE 0.9 % IV BOLUS
1000.0000 mL | Freq: Once | INTRAVENOUS | Status: AC
  Administered 2019-10-20: 02:00:00 1000 mL via INTRAVENOUS

## 2019-10-20 MED ORDER — KETOROLAC TROMETHAMINE 30 MG/ML IJ SOLN
30.0000 mg | Freq: Once | INTRAMUSCULAR | Status: AC
  Administered 2019-10-20: 30 mg via INTRAVENOUS
  Filled 2019-10-20: qty 1

## 2019-10-20 MED ORDER — MAGNESIUM HYDROXIDE 400 MG/5ML PO SUSP
15.0000 mL | Freq: Every day | ORAL | Status: AC
  Administered 2019-10-20: 15 mL via ORAL
  Filled 2019-10-20: qty 30

## 2019-10-20 MED ORDER — DIPHENHYDRAMINE HCL 25 MG PO CAPS
25.0000 mg | ORAL_CAPSULE | Freq: Four times a day (QID) | ORAL | Status: DC | PRN
  Administered 2019-10-20 – 2019-10-27 (×3): 25 mg via ORAL
  Filled 2019-10-20 (×3): qty 1

## 2019-10-20 MED ORDER — SODIUM CHLORIDE 0.9 % IV BOLUS
500.0000 mL | Freq: Once | INTRAVENOUS | Status: AC
  Administered 2019-10-20: 21:00:00 500 mL via INTRAVENOUS

## 2019-10-20 NOTE — Progress Notes (Addendum)
    Tracey Graham for Infectious Disease    Date of Admission:  10/13/2019   Total days of antibiotics 0           ID: Tracey Graham is a 33 y.o. female with seizure disorder found to have fevers, DVT, and aseptic meningitis  Principal Problem:   Fever of unknown origin (FUO) Active Problems:   Hydrocephalus (Tracey Graham)   VP (ventriculoperitoneal) shunt status   Seizure disorder (Tracey Graham)   Fever of unknown origin    Subjective: Started to have fevers again. Improved sensorium. Mom reports she is more like her baseline, doesn't look uncomfortable. Had BM yesterday  Medications:  . dexamethasone  4 mg Oral Q6H  . enoxaparin (LOVENOX) injection  75 mg Subcutaneous Q12H  . PHENObarbital  97.2 mg Intravenous QHS  . phenobarbital  97.2 mg Oral Once  . polyethylene glycol  17 g Oral BID  . senna-docusate  2 tablet Oral BID    Objective: Vital signs in last 24 hours: Temp:  [99.2 F (37.3 C)-102.5 F (39.2 C)] 101 F (38.3 C) (10/24 1203) Pulse Rate:  [89-114] 95 (10/24 1203) Resp:  [16-24] 16 (10/24 1203) BP: (104-137)/(47-79) 104/47 (10/24 1203) SpO2:  [96 %-100 %] 98 % (10/24 1203) Physical Exam  Constitutional: oriented to person, she appears well-developed and well-nourished. No distress.  HENT:  Mouth/Throat: Oropharynx is clear and moist. No oropharyngeal exudate.  Cardiovascular: Normal rate, regular rhythm and normal heart sounds. Exam reveals no gallop and no friction rub.  No murmur heard.  Pulmonary/Chest: Effort normal and breath sounds normal. No respiratory distress. He has no wheezes.  Abdominal: Soft. Bowel sounds are normal. He exhibits no distension. There is no tenderness.  Neurological:she is alert and oriented to person, only- her baseline.  Skin: Skin is warm and dry. No rash noted. No erythema.     Lab Results Recent Labs    10/18/19 0808 10/20/19 0650  WBC 8.7 11.5*  HGB 14.5 12.9  HCT 41.1 35.8*  NA 139 135  K 4.4 4.3  CL 102 102  CO2 27  26  BUN 11 8  CREATININE 0.50 0.56   Microbiology: Reveiwed; hsv pcr is negative Studies/Results: No results found.   Assessment/Plan: Aseptic meningitis = cx are negative. hsv pcr pending. Will continue with supportive care. Will continue with dexamethasone.   Fever = monitor to see if ongoing. Repeat blood cx.  [facility] Cell: [phone] Pager: [phone]  10/20/2019, 4:55 PM

## 2019-10-20 NOTE — Progress Notes (Signed)
PROGRESS NOTE  Tracey Graham LGX:[id number] DOB: 11-07-1986 DOA: 10/13/2019 PCP: Clinic, [facility]  HPI/Recap of past 24 hours: Tracey Tomlinsonis a 33 y.o.femalewith medical history significant forcerebral palsy,wheelchair-bound and fully dependent with ADLs,hydrocephalus s/p VP shunt, seizure disorder, and intellectual disability who presents to the ED for evaluation of fevers and feeling unwell. -In emergency room was febrile to 101.8, white count was 11.3, rest of her electrolytes were unremarkable, urinalysis was negative, chest x-ray was unremarkable, COVID-19 PCR was negative -CT abdomen/pelvis without contrast showed trace lower abdominal pelvic free fluid per radiology may be physiologic or related to chronic VP shunt tubing. No fluid collection, abscess, or acute intra-abdominal or pelvic finding seen. -VP shunt series showed visualized VP shunt catheter intact on skull, chest, and abdominal x-rays. CT head without contrast showed a left frontal approach CSF shunt which appeared stable along with decompressed ventricles since 2014. No acute intracranial abnormality noted. -Subsequent work-up noted left leg DVT, started anticoagulation, SQ full dose lovenox BID. -slow improvement but low grade fevers persist. LP done to r/o meningitis.  LP fluid analysis showed pleocytosis, elevated protein and lymphocytic predominance with no bacteria seen on Gram stain.  HSV PCR and enterovirus PCR pending.  On 10/19/2019 started on p.o. Decadron 4 mg every 6 hours x3 days by infectious disease and Tylenol as needed.  No NSAIDs due to anticoagulation on full dose subcu Lovenox.  10/20/19: Patient seen and examined with her mother at bedside.  Per her mother, neck pain is improved today since started on steroids.  Assessment/Plan: Principal Problem:   Fever of unknown origin (FUO) Active Problems:   Hydrocephalus (HCC)   VP (ventriculoperitoneal) shunt status   Seizure disorder  (HCC)   Fever of unknown origin  Persistent fever of unclear etiology Recently diagnosed with acute DVT of left lower extremity Started full dose anticoagulation subcu Lovenox on 10/14/2019 She continues to have low-grade fevers Other than positive DVT work-up has been unrevealing thus far. Seen by infectious disease, following T-max overnight 101.1 rectally Procalcitonin less than 0.10 on 10/14/2019 Lactic acid 1.1 on 10/13/2019 Urine analysis, chest x-ray, right upper quadrant abdominal ultrasound, all benign. LP fluid analysis showed pleocytosis with no bacteria seen on Gram stain.  HSV PCR and enterovirus PCR pending. VDRL negative Blood cultures x2 peripherally ordered on 10/19/2019, no growth less than 24 hours Continue on Decadron 4 mg every 6 hours x day #2/3 days and Tylenol 650 mg as needed every 4 hours  Recurrent fever, unclear etiology possibly viral T-max 101.1 overnight Lactic acid 0.9 on 10/20/2019. Negative procalcitonin less than 0.10 on 10/14/2019, bacteria etiology less likely.  Acute left lower extremity DVT. Continue anticoagulation Continue subcu Lovenox 75 mg twice daily  Seizure disorder Continue AED  Resolved post repletion: Hypokalemia Potassium 3.4>> 4.3 on 10/20/2019. Repleted with IV 20 mEq  Hydrocephalus s/p chronic VP shunt: VP shunt series showed visualized VP shunt catheter intact on imaging,. CT head without contrast showed a left frontal approach CSF shunt which appeared stable along with decompressed ventricles since 2014.   Dysphagia -Given cerebral palsy, SLP following, continue dysphagia diet  Resolved post laxative: Constipation -continue  MiraLAX and Dulcolax  -BM overnight 10/19/2019.  DVT prophylaxis:Subcu Lovenox full dose subcu twice daily Code Status:Full code, confirmed with mother Family Communication:Updated her mother at bedside.   Disposition Plan:Patient is currently not appropriate for discharge at this  time due to ongoing work-up.  Possible discharge to home on Monday, 10/22/2019 after completing Decadron challenge.  Consultants:  Infectious disease  Interventional radiology   Procedures:   Lumbar puncture by interventional radiology on 10/17/2019.  Objective: Vitals:   10/20/19 0015 10/20/19 0355 10/20/19 0902 10/20/19 1203  BP:  124/67 132/79 (!) 104/47  Pulse:  89 (!) 105 95  Resp:  20 (!) 24 16  Temp: (!) 101.1 F (38.4 C) 99.2 F (37.3 C) (!) 101.5 F (38.6 C) (!) 101 F (38.3 C)  TempSrc: Rectal Oral Axillary Axillary  SpO2:  100% 96% 98%  Weight:      Height:        Intake/Output Summary (Last 24 hours) at 10/20/2019 1540 Last data filed at 10/20/2019 0600 Gross per 24 hour  Intake 1213.35 ml  Output 600 ml  Net 613.35 ml   Filed Weights   10/13/19 1003 10/13/19 2006 10/14/19 0237  Weight: 72.6 kg 74.4 kg 74.7 kg    Exam:  . General: 33 y.o. year-old female well-developed well-nourished in no acute distress.  Alert and minimally interactive.   . Cardiovascular: Regular rate and rhythm no rubs or gallops no JVD or thyromegaly noted.   Marland Kitchen Respiratory: Clear to auscultation no wheezes no rales.  Poor inspiratory effort.   . Abdomen: Soft nontender bowel sounds present.   . Musculoskeletal: Trace lower extremity edema bilaterally. Marland Kitchen Psychiatry: Mood is appropriate for condition and setting.   Data Reviewed: CBC: Recent Labs  Lab 10/14/19 0638 10/16/19 0609 10/17/19 0301 10/18/19 0808 10/20/19 0650  WBC 11.8* 9.0 7.7 8.7 11.5*  NEUTROABS  --   --   --   --  9.8*  HGB 13.3 14.7 13.6 14.5 12.9  HCT 36.6 40.9 38.4 41.1 35.8*  MCV 79.6* 79.4* 79.8* 80.4 79.2*  PLT 194 225 235 233 240   Basic Metabolic Panel: Recent Labs  Lab 10/14/19 0638 10/16/19 0609 10/17/19 0301 10/18/19 0808 10/20/19 0650  NA 140 137 138 139 135  K 3.8 3.6 3.4* 4.4 4.3  CL 107 101 100 102 102  CO2 GLUCOSE 103* 90 87 125* 126*  BUN CREATININE 0.56 0.54 0.53 0.50 0.56  CALCIUM 8.0* 8.6* 8.5* 8.8* 8.4*   GFR: Estimated Creatinine Clearance: 99 mL/min (by C-G formula based on SCr of 0.56 mg/dL). Liver Function Tests: Recent Labs  Lab 10/17/19 0301  AST 17  ALT 18  ALKPHOS 86  BILITOT 0.6  PROT 6.1*  ALBUMIN 3.0*   No results for input(s): LIPASE, AMYLASE in the last 168 hours. No results for input(s): AMMONIA in the last 168 hours. Coagulation Profile: No results for input(s): INR, PROTIME in the last 168 hours. Cardiac Enzymes: No results for input(s): CKTOTAL, CKMB, CKMBINDEX, TROPONINI in the last 168 hours. BNP (last 3 results) No results for input(s): PROBNP in the last 8760 hours. HbA1C: No results for input(s): HGBA1C in the last 72 hours. CBG: No results for input(s): GLUCAP in the last 168 hours. Lipid Profile: No results for input(s): CHOL, HDL, LDLCALC, TRIG, CHOLHDL, LDLDIRECT in the last 72 hours. Thyroid Function Tests: No results for input(s): TSH, T4TOTAL, FREET4, T3FREE, THYROIDAB in the last 72 hours. Anemia Panel: No results for input(s): VITAMINB12, FOLATE, FERRITIN, TIBC, IRON, RETICCTPCT in the last 72 hours. Urine analysis:    Component Value Date/Time   COLORURINE YELLOW 10/13/2019 1710   APPEARANCEUR HAZY (A) 10/13/2019 1710   LABSPEC 1.029 10/13/2019 1710   PHURINE 5.0 10/13/2019 1710   GLUCOSEU NEGATIVE 10/13/2019 1710   HGBUR NEGATIVE  10/13/2019 [place] 10/13/2019 1710   KETONESUR 80 (A) 10/13/2019 1710   PROTEINUR 30 (A) 10/13/2019 1710   UROBILINOGEN 1.0 01/05/2013 2116   NITRITE NEGATIVE 10/13/2019 1710   LEUKOCYTESUR NEGATIVE 10/13/2019 1710   Sepsis Labs: @LABRCNTIP (procalcitonin:4,lacticidven:4)  ) Recent Results (from the past 240 hour(s))  Culture, blood (Routine x 2)     Status: None   Collection Time: 10/13/19  8:55 AM   Specimen: BLOOD  Result Value Ref Range Status   Specimen Description BLOOD LEFT ANTECUBITAL  Final   Special  Requests   Final    BOTTLES DRAWN AEROBIC ONLY Blood Culture adequate volume   Culture   Final    NO GROWTH 5 DAYS Performed at [facility], [place] [address] [place], [place] [address]    Report Status 10/18/2019 FINAL  Final  Culture, blood (Routine x 2)     Status: None   Collection Time: 10/13/19 11:44 AM   Specimen: BLOOD  Result Value Ref Range Status   Specimen Description BLOOD RIGHT ANTECUBITAL  Final   Special Requests   Final    BOTTLES DRAWN AEROBIC AND ANAEROBIC Blood Culture results may not be optimal due to an inadequate volume of blood received in culture bottles   Culture   Final    NO GROWTH 5 DAYS Performed at [facility], [place] [address] [place], [place] [address]    Report Status 10/18/2019 FINAL  Final  SARS CORONAVIRUS 2 (TAT 6-24 HRS) Nasopharyngeal Nasopharyngeal Swab     Status: None   Collection Time: 10/13/19  8:05 PM   Specimen: Nasopharyngeal Swab  Result Value Ref Range Status   SARS Coronavirus 2 NEGATIVE NEGATIVE Final    Comment: (NOTE) SARS-CoV-2 target nucleic acids are NOT DETECTED. The SARS-CoV-2 RNA is generally detectable in upper and lower respiratory specimens during the acute phase of infection. Negative results do not preclude SARS-CoV-2 infection, do not rule out co-infections with other pathogens, and should not be used as the sole basis for treatment or other patient management decisions. Negative results must be combined with clinical observations, patient history, and epidemiological information. The expected result is Negative. Fact Sheet for Patients: SugarRoll.be Fact Sheet for Healthcare Providers: https://www.woods-mathews.com/ This test is not yet approved or cleared by the [place] FDA and  has been authorized for detection and/or diagnosis of SARS-CoV-2 by FDA under an Emergency Use Authorization (EUA). This EUA will remain  in effect (meaning this test can be  used) for the duration of the COVID-19 declaration under Section 56 4(b)(1) of the Act, 21 U.S.C. section 360bbb-3(b)(1), unless the authorization is terminated or revoked sooner. Performed at [facility], [place] [address], [place], [place] [address]   Anaerobic culture     Status: None (Preliminary result)   Collection Time: 10/17/19 10:05 AM   Specimen: PATH Cytology CSF; Cerebrospinal Fluid  Result Value Ref Range Status   Specimen Description CSF  Final   Special Requests   Final    NONE Performed at [facility], [place] [address] [place], [place] [address]    Culture   Final    NO ANAEROBES ISOLATED; CULTURE IN PROGRESS FOR 5 DAYS   Report Status PENDING  Incomplete  CSF culture     Status: None   Collection Time: 10/17/19 10:05 AM   Specimen: PATH Cytology CSF; Cerebrospinal Fluid  Result Value Ref Range Status   Specimen Description CSF  Final  Special Requests NONE  Final   Gram Stain   Final    WBC PRESENT,BOTH PMN AND MONONUCLEAR NO ORGANISMS SEEN CYTOSPIN SMEAR    Culture   Final    NO GROWTH 3 DAYS Performed at [facility], [address] [place], [place] [address]    Report Status 10/20/2019 FINAL  Final  Culture, blood (routine x 2)     Status: None (Preliminary result)   Collection Time: 10/19/19 10:12 AM   Specimen: BLOOD LEFT HAND  Result Value Ref Range Status   Specimen Description BLOOD LEFT HAND  Final   Special Requests   Final    BOTTLES DRAWN AEROBIC ONLY Blood Culture adequate volume   Culture   Final    NO GROWTH < 24 HOURS Performed at [facility], [address] [place], [place] [address]    Report Status PENDING  Incomplete  Culture, blood (routine x 2)     Status: None (Preliminary result)   Collection Time: 10/19/19 10:24 AM   Specimen: BLOOD LEFT HAND  Result Value Ref Range Status   Specimen Description BLOOD LEFT HAND  Final   Special Requests   Final    BOTTLES DRAWN AEROBIC ONLY Blood Culture adequate  volume   Culture   Final    NO GROWTH < 24 HOURS Performed at [facility], [address] [place], [place] [address]    Report Status PENDING  Incomplete      Studies: No results found.  Scheduled Meds: . dexamethasone  4 mg Oral Q6H  . enoxaparin (LOVENOX) injection  75 mg Subcutaneous Q12H  . PHENObarbital  97.2 mg Intravenous QHS  . phenobarbital  97.2 mg Oral Once  . polyethylene glycol  17 g Oral BID  . senna-docusate  2 tablet Oral BID    Continuous Infusions:   LOS: 6 days     Darlin Droparole N Masaye Gatchalian, MD [facility] Pager [phone]  If 7PM-7AM, please contact night-coverage [email or web address] Password [email or web address] 10/20/2019, 3:40 PM

## 2019-10-21 DIAGNOSIS — I82409 Acute embolism and thrombosis of unspecified deep veins of unspecified lower extremity: Secondary | ICD-10-CM | POA: Diagnosis not present

## 2019-10-21 DIAGNOSIS — K5900 Constipation, unspecified: Secondary | ICD-10-CM | POA: Diagnosis not present

## 2019-10-21 DIAGNOSIS — G030 Nonpyogenic meningitis: Secondary | ICD-10-CM | POA: Diagnosis not present

## 2019-10-21 DIAGNOSIS — R509 Fever, unspecified: Secondary | ICD-10-CM | POA: Diagnosis not present

## 2019-10-21 MED ORDER — KETOROLAC TROMETHAMINE 30 MG/ML IJ SOLN
30.0000 mg | Freq: Once | INTRAMUSCULAR | Status: AC
  Administered 2019-10-21: 30 mg via INTRAVENOUS
  Filled 2019-10-21: qty 1

## 2019-10-21 MED ORDER — PHENOBARBITAL 32.4 MG PO TABS
97.2000 mg | ORAL_TABLET | Freq: Every day | ORAL | Status: DC
  Administered 2019-10-21 – 2019-11-11 (×20): 97.2 mg via ORAL
  Filled 2019-10-21 (×8): qty 3
  Filled 2019-10-21: qty 1
  Filled 2019-10-21 (×2): qty 3
  Filled 2019-10-21: qty 1
  Filled 2019-10-21 (×7): qty 3
  Filled 2019-10-21 (×2): qty 1
  Filled 2019-10-21: qty 3

## 2019-10-21 MED ORDER — BISACODYL 10 MG RE SUPP
10.0000 mg | Freq: Once | RECTAL | Status: AC
  Administered 2019-10-21: 10 mg via RECTAL
  Filled 2019-10-21: qty 1

## 2019-10-21 MED ORDER — APIXABAN 5 MG PO TABS: 5.0000 mg | ORAL_TABLET | Freq: Two times a day (BID) | ORAL | Status: DC

## 2019-10-21 MED ORDER — APIXABAN 5 MG PO TABS
10.0000 mg | ORAL_TABLET | Freq: Once | ORAL | Status: AC
  Administered 2019-10-21: 17:00:00 10 mg via ORAL
  Filled 2019-10-21: qty 2

## 2019-10-21 MED ORDER — APIXABAN 5 MG PO TABS
10.0000 mg | ORAL_TABLET | Freq: Two times a day (BID) | ORAL | Status: DC
  Administered 2019-10-22: 10 mg via ORAL
  Filled 2019-10-21: qty 2

## 2019-10-21 NOTE — Discharge Instructions (Signed)

## 2019-10-21 NOTE — Progress Notes (Addendum)
[place] for Lovenox to Eliquis Indication: DVT  Allergies  Allergen Reactions  . Penicillins Swelling    Did not have reaction to 5d of amoxicillin Has patient had a PCN reaction causing immediate rash, facial/tongue/throat swelling, SOB or lightheadedness with hypotension: Yes Has patient had a PCN reaction causing severe rash involving mucus membranes or skin necrosis: No Has patient had a PCN reaction that required hospitalization No Has patient had a PCN reaction occurring within the last 10 years: No If all of the above answers are "NO", then may proceed with Cephalosporin use.     Patient Measurements: Height: 5\' 4"  (162.6 cm) Weight: 164 lb 10.9 oz (74.7 kg) IBW/kg (Calculated) : 54.7  Vital Signs: Temp: 100 F (37.8 C) (10/25 0800) Temp Source: Axillary (10/25 0800) BP: 122/60 (10/25 0800) Pulse Rate: 100 (10/25 0800)  Labs: Recent Labs    10/20/19 0650  HGB 12.9  HCT 35.8*  PLT 240  CREATININE 0.56    Estimated Creatinine Clearance: 99 mL/min (by C-G formula based on SCr of 0.56 mg/dL).   Medical History: Past Medical History:  Diagnosis Date  . Hydrocephalus ([place])   . Seizures ([place])   . VP (ventriculoperitoneal) shunt status     Assessment: Tracey Graham who presented on 10/17 with fevers/abdominal pain - now found to have an acute DVT.  Continues on full dose Lovenox, CBC stable and Scr stable.  Goal of Therapy:  Anti-Xa level 0.6-1 units/ml 4hrs after LMWH dose given Monitor platelets by anticoagulation protocol: Yes   Plan:  Continue Lovenox 75 mg SQ every 12 hours  Monitor SCr, CBC at least q72h, s/sx bleeding  10:30 AM: Transitioning to Eliquis when Lovenox next due at 1800 pm, Eliquis 10 mg po BID x 7 days  then to decrease to  5 mg po BID   Thank you Anette Guarneri, PharmD [phone] 10/21/2019 8:20 AM

## 2019-10-21 NOTE — Progress Notes (Addendum)
PROGRESS NOTE  Tracey Graham AOZ:[id number] DOB: September 11, 1986 DOA: 10/13/2019 PCP: [facility]  HPI/Recap of past 24 hours: Tracey Tomlinsonis a 33 y.o.femalewith medical history significant forcerebral palsy,wheelchair-bound and fully dependent with ADLs,hydrocephalus s/p VP shunt, seizure disorder, and intellectual disability who presents to the ED for evaluation of fevers and feeling unwell. -In emergency room was febrile to 101.8, white count was 11.3, rest of her electrolytes were unremarkable, urinalysis was negative, chest x-ray was unremarkable, COVID-19 PCR was negative -CT abdomen/pelvis without contrast showed trace lower abdominal pelvic free fluid per radiology may be physiologic or related to chronic VP shunt tubing. No fluid collection, abscess, or acute intra-abdominal or pelvic finding seen. -VP shunt series showed visualized VP shunt catheter intact on skull, chest, and abdominal x-rays. CT head without contrast showed a left frontal approach CSF shunt which appeared stable along with decompressed ventricles since 2014. No acute intracranial abnormality noted. -Subsequent work-up noted left leg DVT, started anticoagulation, SQ full dose lovenox BID. -slow improvement but low grade fevers persist. LP done to r/o meningitis.  LP fluid analysis showed pleocytosis, elevated protein and lymphocytic predominance with no bacteria seen on Gram stain.  HSV PCR and enterovirus PCR pending.  On 10/19/2019 started on p.o. Decadron 4 mg every 6 hours x3 days by infectious disease and Tylenol as needed.  No NSAIDs due to anticoagulation on full dose subcu Lovenox.  10/21/19: Patient seen and examined with her mother at bedside.  Neck pain has continued to improve.  The patient did have increased fevers last night.  T-max of 102.2 this morning.  Assessment/Plan: Principal Problem:   Fever of unknown origin (FUO) Active Problems:   Hydrocephalus (HCC)   VP  (ventriculoperitoneal) shunt status   Seizure disorder (HCC)   Fever of unknown origin  Persistent fever of unclear etiology Recently diagnosed with acute DVT of left lower extremity Started full dose anticoagulation subcu Lovenox on 10/14/2019 She continues to have low-grade fevers Other than positive DVT work-up has been unrevealing thus far. Seen by infectious disease, following T-max overnight 102 rectally Procalcitonin less than 0.10 on 10/14/2019 Lactic acid 1.1 on 10/13/2019 Urine analysis, chest x-ray, right upper quadrant abdominal ultrasound, all benign. LP fluid analysis showed pleocytosis with no bacteria seen on Gram stain.  HSV PCR negative.  Enterovirus PCR still pending. VDRL negative Blood cultures x2 peripherally ordered on 10/19/2019, no growth x48 hours. Continue on Decadron 4 mg every 6 hours x day #2/3 days and Tylenol 650 mg as needed every 4 hours Question of whether this is related to DVT versus fever related to Lovenox, as a small percentage of patients (up to 8%) can have a fever with this medication.  Interestingly, the patient's fevers were less and not as pronounced while the patient was off the medication during her LP.  Will transition to Eliquis for treatment of her DVT to rule this out. Repeat CBC today  Recurrent fever, unclear etiology possibly viral T-max 101.1 overnight Lactic acid 0.9 on 10/20/2019. Negative procalcitonin less than 0.10 on 10/14/2019, bacteria etiology less likely.  Acute left lower extremity DVT. Continue anticoagulation Will transition to Eliquis  Seizure disorder Continue AED  Resolved post repletion: Hypokalemia Potassium 3.4>> 4.3 on 10/20/2019. Repleted with IV 20 mEq  Hydrocephalus s/p chronic VP shunt: VP shunt series showed visualized VP shunt catheter intact on imaging,. CT head without contrast showed a left frontal approach CSF shunt which appeared stable along with decompressed ventricles since 2014.    Dysphagia -Given  cerebral palsy, SLP following, continue dysphagia diet  Resolved post laxative: Constipation -continue  MiraLAX and Dulcolax  -BM overnight 10/19/2019.  DVT prophylaxis:Eliquis per pharmacy Code Status:Full code, confirmed with mother Family Communication:Updated her mother at bedside.   Disposition Plan:Patient is currently not appropriate for discharge at this time due to ongoing work-up.  Possible discharge to home on Monday, 10/22/2019 after completing Decadron challenge.  Consultants:   Infectious disease  Interventional radiology   Procedures:   Lumbar puncture by interventional radiology on 10/17/2019.  Objective: Vitals:   10/20/19 2355 10/21/19 0227 10/21/19 0611 10/21/19 0800  BP: 127/75   122/60  Pulse: 86   100  Resp: 19     Temp:  98.3 F (36.8 C) (!) 102.2 F (39 C) 100 F (37.8 C)  TempSrc:  Axillary Rectal Axillary  SpO2: 98%   98%  Weight:      Height:        Intake/Output Summary (Last 24 hours) at 10/21/2019 1031 Last data filed at 10/21/2019 0600 Gross per 24 hour  Intake -  Output 650 ml  Net -650 ml   Filed Weights   10/13/19 1003 10/13/19 2006 10/14/19 0237  Weight: 72.6 kg 74.4 kg 74.7 kg    Exam:  . General: 33 y.o. year-old female well-developed well-nourished in no acute distress.  Alert and minimally interactive.   . Cardiovascular: Regular rate.  No murmurs, rubs, gallops.  Normal S1-S2 sounds..   . Respiratory: Clear to auscultation.  No wheezes, rales, rhonchi . Abdomen: Abdomen soft and nontender.  Active bowel sounds..   . Musculoskeletal: No lower extremity edema bilaterally.  Marland Kitchen. Psychiatry: Mood is appropriate for condition and setting.  Unable to determine insight or judgment.   Data Reviewed: CBC: Recent Labs  Lab 10/16/19 0609 10/17/19 0301 10/18/19 0808 10/20/19 0650  WBC 9.0 7.7 8.7 11.5*  NEUTROABS  --   --   --  9.8*  HGB 14.7 13.6 14.5 12.9  HCT 40.9 38.4 41.1 35.8*  MCV  79.4* 79.8* 80.4 79.2*  PLT 225 235 233 240   Basic Metabolic Panel: Recent Labs  Lab 10/16/19 0609 10/17/19 0301 10/18/19 0808 10/20/19 0650  NA 137 138 139 135  K 3.6 3.4* 4.4 4.3  CL 101 100 102 102  CO2 26 26 27 26   GLUCOSE 90 87 125* 126*  BUN 8 8 11 8   CREATININE 0.54 0.53 0.50 0.56  CALCIUM 8.6* 8.5* 8.8* 8.4*   GFR: Estimated Creatinine Clearance: 99 mL/min (by C-G formula based on SCr of 0.56 mg/dL). Liver Function Tests: Recent Labs  Lab 10/17/19 0301  AST 17  ALT 18  ALKPHOS 86  BILITOT 0.6  PROT 6.1*  ALBUMIN 3.0*   No results for input(s): LIPASE, AMYLASE in the last 168 hours. No results for input(s): AMMONIA in the last 168 hours. Coagulation Profile: No results for input(s): INR, PROTIME in the last 168 hours. Cardiac Enzymes: No results for input(s): CKTOTAL, CKMB, CKMBINDEX, TROPONINI in the last 168 hours. BNP (last 3 results) No results for input(s): PROBNP in the last 8760 hours. HbA1C: No results for input(s): HGBA1C in the last 72 hours. CBG: No results for input(s): GLUCAP in the last 168 hours. Lipid Profile: No results for input(s): CHOL, HDL, LDLCALC, TRIG, CHOLHDL, LDLDIRECT in the last 72 hours. Thyroid Function Tests: No results for input(s): TSH, T4TOTAL, FREET4, T3FREE, THYROIDAB in the last 72 hours. Anemia Panel: No results for input(s): VITAMINB12, FOLATE, FERRITIN, TIBC, IRON, RETICCTPCT in the  last 72 hours. Urine analysis:    Component Value Date/Time   COLORURINE YELLOW 10/13/2019 1710   APPEARANCEUR HAZY (A) 10/13/2019 1710   LABSPEC 1.029 10/13/2019 1710   PHURINE 5.0 10/13/2019 1710   GLUCOSEU NEGATIVE 10/13/2019 1710   HGBUR NEGATIVE 10/13/2019 1710   BILIRUBINUR NEGATIVE 10/13/2019 1710   KETONESUR 80 (A) 10/13/2019 1710   PROTEINUR 30 (A) 10/13/2019 1710   UROBILINOGEN 1.0 01/05/2013 2116   NITRITE NEGATIVE 10/13/2019 1710   LEUKOCYTESUR NEGATIVE 10/13/2019 1710   Sepsis Labs:  @[email or web address] (procalcitonin:4,lacticidven:4)  ) Recent Results (from the past 240 hour(s))  Culture, blood (Routine x 2)     Status: None   Collection Time: 10/13/19  8:55 AM   Specimen: BLOOD  Result Value Ref Range Status   Specimen Description BLOOD LEFT ANTECUBITAL  Final   Special Requests   Final    BOTTLES DRAWN AEROBIC ONLY Blood Culture adequate volume   Culture   Final    NO GROWTH 5 DAYS Performed at [facility], [place] [address], [place], [place] [address]    Report Status 10/18/2019 FINAL  Final  Culture, blood (Routine x 2)     Status: None   Collection Time: 10/13/19 11:44 AM   Specimen: BLOOD  Result Value Ref Range Status   Specimen Description BLOOD RIGHT ANTECUBITAL  Final   Special Requests   Final    BOTTLES DRAWN AEROBIC AND ANAEROBIC Blood Culture results may not be optimal due to an inadequate volume of blood received in culture bottles   Culture   Final    NO GROWTH 5 DAYS Performed at [facility], [place] [address], [place], [place] [address]    Report Status 10/18/2019 FINAL  Final  SARS CORONAVIRUS 2 (TAT 6-24 HRS) Nasopharyngeal Nasopharyngeal Swab     Status: None   Collection Time: 10/13/19  8:05 PM   Specimen: Nasopharyngeal Swab  Result Value Ref Range Status   SARS Coronavirus 2 NEGATIVE NEGATIVE Final    Comment: (NOTE) SARS-CoV-2 target nucleic acids are NOT DETECTED. The SARS-CoV-2 RNA is generally detectable in upper and lower respiratory specimens during the acute phase of infection. Negative results do not preclude SARS-CoV-2 infection, do not rule out co-infections with other pathogens, and should not be used as the sole basis for treatment or other patient management decisions. Negative results must be combined with clinical observations, patient history, and epidemiological information. The expected result is Negative. Fact Sheet for Patients: SugarRoll.be Fact Sheet for Healthcare Providers:  https://www.woods-mathews.com/ This test is not yet approved or cleared by the [place] FDA and  has been authorized for detection and/or diagnosis of SARS-CoV-2 by FDA under an Emergency Use Authorization (EUA). This EUA will remain  in effect (meaning this test can be used) for the duration of the COVID-19 declaration under Section 56 4(b)(1) of the Act, 21 U.S.C. section 360bbb-3(b)(1), unless the authorization is terminated or revoked sooner. Performed at [facility], [place] [address] [place], [place] [address]   Anaerobic culture     Status: None (Preliminary result)   Collection Time: 10/17/19 10:05 AM   Specimen: PATH Cytology CSF; Cerebrospinal Fluid  Result Value Ref Range Status   Specimen Description CSF  Final   Special Requests   Final    NONE Performed at [facility], [place] [address], [place], [place] [address]    Culture   Final    NO ANAEROBES ISOLATED; CULTURE IN PROGRESS FOR 5 DAYS  Report Status PENDING  Incomplete  CSF culture     Status: None   Collection Time: 10/17/19 10:05 AM   Specimen: PATH Cytology CSF; Cerebrospinal Fluid  Result Value Ref Range Status   Specimen Description CSF  Final   Special Requests NONE  Final   Gram Stain   Final    WBC PRESENT,BOTH PMN AND MONONUCLEAR NO ORGANISMS SEEN CYTOSPIN SMEAR    Culture   Final    NO GROWTH 3 DAYS Performed at [facility], [address], [place], [place] [address]    Report Status 10/20/2019 FINAL  Final  Culture, blood (routine x 2)     Status: None (Preliminary result)   Collection Time: 10/19/19 10:12 AM   Specimen: BLOOD LEFT HAND  Result Value Ref Range Status   Specimen Description BLOOD LEFT HAND  Final   Special Requests   Final    BOTTLES DRAWN AEROBIC ONLY Blood Culture adequate volume   Culture   Final    NO GROWTH < 24 HOURS Performed at [facility], [address], [place], [place] [address]    Report Status PENDING  Incomplete   Culture, blood (routine x 2)     Status: None (Preliminary result)   Collection Time: 10/19/19 10:24 AM   Specimen: BLOOD LEFT HAND  Result Value Ref Range Status   Specimen Description BLOOD LEFT HAND  Final   Special Requests   Final    BOTTLES DRAWN AEROBIC ONLY Blood Culture adequate volume   Culture   Final    NO GROWTH < 24 HOURS Performed at [facility], [address], [place], [place] [address]    Report Status PENDING  Incomplete      Studies: No results found.  Scheduled Meds: . dexamethasone  4 mg Oral Q6H  . enoxaparin (LOVENOX) injection  75 mg Subcutaneous Q12H  . phenobarbital  97.2 mg Oral QHS  . polyethylene glycol  17 g Oral BID  . senna-docusate  2 tablet Oral BID    Continuous Infusions:   LOS: 7 days     Levie Heritage, DO  [facility]  10/21/2019, 10:31 AM

## 2019-10-21 NOTE — Progress Notes (Signed)
    [facility] for Infectious Disease    Date of Admission:  10/13/2019   Total days of antibiotics 0           ID: Tracey Graham is a 33 y.o. female with fever found to have dvt plus presumed aseptic meningitis Principal Problem:   Fever of unknown origin (FUO) Active Problems:   Hydrocephalus ([place])   VP (ventriculoperitoneal) shunt status   Seizure disorder (HCC)   Fever of unknown origin    Subjective: Fever yesterday but she is improved with her alertness and close to her baseline per her mother's report. Still reports constipation. Denies neck pain. Her mother reports left arm being warm to touch but on exam today now normothermic and non-tender, non-swollen  Medications:  . apixaban  10 mg Oral Once  . [START ON 10/22/2019] apixaban  10 mg Oral BID   Followed by  . [START ON 11/05/2019] apixaban  5 mg Oral BID  . dexamethasone  4 mg Oral Q6H  . phenobarbital  97.2 mg Oral QHS  . polyethylene glycol  17 g Oral BID  . senna-docusate  2 tablet Oral BID    Objective: Vital signs in last 24 hours: Temp:  [98.3 F (36.8 C)-102.2 F (39 C)] 99.1 F (37.3 C) (10/25 1100) Pulse Rate:  [86-108] 100 (10/25 0800) Resp:  [17-19] 19 (10/24 2355) BP: (115-149)/(56-79) 115/56 (10/25 1100) SpO2:  [98 %-100 %] 100 % (10/25 1100) Physical Exam  Constitutional:  oriented to person, . appears well-developed and well-nourished. No distress.  HENT: [place]/AT, PERRLA, no scleral icterus Mouth/Throat: Oropharynx is clear and moist. No oropharyngeal exudate.  Cardiovascular: Normal rate, regular rhythm and normal heart sounds. Exam reveals no gallop and no friction rub.  No murmur heard.  Pulmonary/Chest: Effort normal and breath sounds normal. No respiratory distress.  has no wheezes.  Neck = supple, no nuchal rigidity Abdominal: Soft. Bowel sounds are normal.  exhibits no distension. There is no tenderness.  Lymphadenopathy: no cervical adenopathy. No axillary adenopathy  Neurological: alert and oriented to person, Skin: Skin is warm and dry. No rash noted. No erythema.    Lab Results Recent Labs    10/20/19 0650  WBC 11.5*  HGB 12.9  HCT 35.8*  NA 135  K 4.3  CL 102  CO2 26  BUN 8  CREATININE 0.56   Microbiology: ngtd Studies/Results: No results found.   Assessment/Plan: Aseptic meningitis = cultures and hsv pcr negative. Recommend continued supportive care. We initially started 3 days of dexamethasone which today is the last day - to help with neck discomfort which is now improved  DVT = possible cause of fever, though unclear if it explains recurrence in the last 48hrs. Patient appears non-toxic. Agree with the plan outlined by dr Nehemiah Settle to switch back to eliquis.  Constipation = will place bisacodyl suppository today so that minimize abdominal discomfort for her  [facility] for Infectious Diseases Cell: [phone] Pager: [phone]  10/21/2019, 3:25 PM

## 2019-10-22 DIAGNOSIS — R291 Meningismus: Secondary | ICD-10-CM | POA: Diagnosis present

## 2019-10-22 DIAGNOSIS — I82412 Acute embolism and thrombosis of left femoral vein: Secondary | ICD-10-CM | POA: Diagnosis present

## 2019-10-22 DIAGNOSIS — R109 Unspecified abdominal pain: Secondary | ICD-10-CM

## 2019-10-22 DIAGNOSIS — K5900 Constipation, unspecified: Secondary | ICD-10-CM | POA: Diagnosis present

## 2019-10-22 DIAGNOSIS — G030 Nonpyogenic meningitis: Secondary | ICD-10-CM | POA: Diagnosis present

## 2019-10-22 LAB — CBC
HCT: 37.9 % (ref 36.0–46.0)
Hemoglobin: 13.7 g/dL (ref 12.0–15.0)
MCH: 28.7 pg (ref 26.0–34.0)
MCHC: 36.1 g/dL — ABNORMAL HIGH (ref 30.0–36.0)
MCV: 79.3 fL — ABNORMAL LOW (ref 80.0–100.0)
Platelets: 264 10*3/uL (ref 150–400)
RBC: 4.78 MIL/uL (ref 3.87–5.11)
RDW: 13.5 % (ref 11.5–15.5)
WBC: 13.9 10*3/uL — ABNORMAL HIGH (ref 4.0–10.5)
nRBC: 0 % (ref 0.0–0.2)

## 2019-10-22 LAB — ANAEROBIC CULTURE

## 2019-10-22 MED ORDER — APIXABAN 5 MG PO TABS
10.0000 mg | ORAL_TABLET | Freq: Two times a day (BID) | ORAL | Status: DC
  Administered 2019-10-22 – 2019-10-24 (×5): 10 mg via ORAL
  Filled 2019-10-22 (×5): qty 2

## 2019-10-22 MED ORDER — APIXABAN 5 MG PO TABS: 5.0000 mg | ORAL_TABLET | Freq: Two times a day (BID) | ORAL | Status: DC

## 2019-10-22 MED ORDER — BISACODYL 10 MG RE SUPP
10.0000 mg | Freq: Every day | RECTAL | Status: DC | PRN
  Administered 2019-10-23: 10 mg via RECTAL
  Filled 2019-10-22: qty 1

## 2019-10-22 NOTE — Progress Notes (Signed)
Transitions of Care Pharmacist Note  Ivet Guerrieri is a 33 y.o. female that has been diagnosed with DVT and will be prescribed Eliquis (apixaban) at discharge.   Patient Education: I provided the following education on apixaban to the patient: How to take the medication Described what the medication is Signs of bleeding Signs/symptoms of VTE and stroke  Answered their questions  Discharge Medications Plan: The patient wants to have their discharge medications filled by the Transitions of Care pharmacy rather than their usual pharmacy.  The discharge orders pharmacy has been changed to the Transitions of Care pharmacy, the patient will receive a phone call regarding co-pay, and their medications will be delivered by the Transitions of Care pharmacy.   Insurance information: N/A   Thank you,   Cristela Felt, PharmD PGY1 Pharmacy Resident [employer]: [phone]  October 22, 2019

## 2019-10-22 NOTE — Plan of Care (Signed)

## 2019-10-22 NOTE — Progress Notes (Addendum)
PROGRESS NOTE  Tracey Graham ZJI:[id number] DOB: [birth year] DOA: 10/13/2019 PCP: [facility]  HPI/Recap of past 24 hours: Tracey Tomlinsonis a 33 y.o.femalewith medical history significant forcerebral palsy,wheelchair-bound and fully dependent with ADLs,hydrocephalus s/p VP shunt, seizure disorder, and intellectual disability who presents to the ED for evaluation of fevers and feeling unwell. -In emergency room was febrile to 101.8, white count was 11.3, rest of her electrolytes were unremarkable, urinalysis was negative, chest x-ray was unremarkable, COVID-19 PCR was negative -CT abdomen/pelvis without contrast showed trace lower abdominal pelvic free fluid per radiology may be physiologic or related to chronic VP shunt tubing. No fluid collection, abscess, or acute intra-abdominal or pelvic finding seen. -VP shunt series showed visualized VP shunt catheter intact on skull, chest, and abdominal x-rays. CT head without contrast showed a left frontal approach CSF shunt which appeared stable along with decompressed ventricles since 2014. No acute intracranial abnormality noted. -Subsequent work-up noted left leg DVT, started anticoagulation, SQ full dose lovenox BID. -slow improvement but low grade fevers persist. LP done to r/o meningitis.  LP fluid analysis showed pleocytosis, elevated protein and lymphocytic predominance with no bacteria seen on Gram stain.  HSV PCR and enterovirus PCR pending.  On 10/19/2019 started on p.o. Decadron 4 mg every 6 hours x3 days by infectious disease and Tylenol as needed.  No NSAIDs due to anticoagulation on full dose subcu Lovenox.  10/22/19: Patient was seen and examined with her mother at bedside.  Suspicion for Lovenox induced fevers.  Started on Eliquis on 10/21/2019.  T-max 99.8 earlier this morning around 3AM.  Per her mother she seems to be doing better.  Assessment/Plan: Principal Problem:   Fever of unknown origin (FUO)  Active Problems:   Hydrocephalus (HCC)   VP (ventriculoperitoneal) shunt status   Encephalopathy   Fever of unknown origin   Lower abdominal pain   Nuchal rigidity   Aseptic meningitis   Constipation   Deep vein thrombosis (DVT) of femoral vein of left lower extremity (HCC)  Recurrent fever of unclear etiology Recently diagnosed with acute DVT of left lower extremity Started full dose anticoagulation subcu Lovenox on 10/14/2019 She continues to have low-grade fevers Other than positive DVT work-up has been unrevealing thus far. Seen by infectious disease Procalcitonin less than 0.10 on 10/14/2019 Lactic acid 1.1 on 10/13/2019 Urine analysis, chest x-ray, right upper quadrant abdominal ultrasound, all benign. LP fluid analysis showed lymphocytosis, pleocytosis with no bacteria seen on Gram stain.  HSV PCR negative and enterovirus PCR pending. VDRL negative Blood cultures x2 peripherally ordered on 10/19/2019, no growth x3 days.   Completed on Decadron 4 mg every 6 hours x 3 days.  Presumed aseptic meningitis Cultures and HSV PCR negative Infectious disease following Continue supportive care Completed 3 days of Decadron on 10/21/2019  Presumed Lovenox induced fevers  Can occur in a small percentage of population. Off Lovenox since 10/21/2019, started on Eliquis T-max thus far 99.8 on 10/22/2019 at 3 AM Lactic acid 0.9 on 10/20/2019. Negative procalcitonin less than 0.10 on 10/14/2019, bacteria etiology less likely.  Acute left lower extremity DVT. Continue anticoagulation with Eliquis  Seizure disorder Continue AED  Resolved post repletion: Hypokalemia Potassium 3.4>> 4.3 on 10/20/2019. Repleted with IV 20 mEq  Hydrocephalus s/p chronic VP shunt: VP shunt series showed visualized VP shunt catheter intact on imaging,. CT head without contrast showed a left frontal approach CSF shunt which appeared stable along with decompressed ventricles since 2014.   Dysphagia  -Given cerebral palsy, SLP  following, continue dysphagia diet  Chronic constipation: Resolved post laxative She takes MiraLAX at home daily per her mother -continue  MiraLAX and Dulcolax supp -BM overnight 10/21/2019.  DVT prophylaxis:Eliquis Code Status:Full code, confirmed with mother Family Communication:Updated her mother at bedside on 10/22/2019   Disposition Plan:Patient is currently not appropriate for discharge at this time due to ongoing work-up.  Possible discharge to home on 10/23/2019 if remains afebrile in 48 hours.  Consultants:   Infectious disease  Interventional radiology   Procedures:   Lumbar puncture by interventional radiology on 10/17/2019.  Objective: Vitals:   10/21/19 1700 10/21/19 2138 10/21/19 2310 10/22/19 0323  BP: (!) 146/73 135/74 (!) 132/55 109/87  Pulse: 90 79 81 81  Resp:  20 18 16   Temp: 99.3 F (37.4 C) 99.7 F (37.6 C) 99.8 F (37.7 C) 99.8 F (37.7 C)  TempSrc: Axillary  Oral Oral  SpO2: 99%  99% 100%  Weight:      Height:        Intake/Output Summary (Last 24 hours) at 10/22/2019 1436 Last data filed at 10/21/2019 2136 Gross per 24 hour  Intake -  Output 400 ml  Net -400 ml   Filed Weights   10/13/19 1003 10/13/19 2006 10/14/19 0237  Weight: 72.6 kg 74.4 kg 74.7 kg    Exam:  . General: 33 y.o. year-old female well-developed well-nourished in no acute distress.  Alert and interactive.   . Cardiovascular: Regular rate and rhythm no rubs or gallops no JVD or thyromegaly noted.   Marland Kitchen. Respiratory: Clear to auscultation no wheezes no rales. Poor inspiratory effort.   . Abdomen: Nontender normal bowel sounds present . Musculoskeletal: Trace lower extremity edema bilaterally.  Psychiatry: Mood is appropriate for condition and setting.   Data Reviewed: CBC: Recent Labs  Lab 10/16/19 0609 10/17/19 0301 10/18/19 0808 10/20/19 0650 10/22/19 1331  WBC 9.0 7.7 8.7 11.5* 13.9*  NEUTROABS  --   --   --  9.8*  --    HGB 14.7 13.6 14.5 12.9 13.7  HCT 40.9 38.4 41.1 35.8* 37.9  MCV 79.4* 79.8* 80.4 79.2* 79.3*  PLT 225 235 233 240 264   Basic Metabolic Panel: Recent Labs  Lab 10/16/19 0609 10/17/19 0301 10/18/19 0808 10/20/19 0650  NA 137 138 139 135  K 3.6 3.4* 4.4 4.3  CL 101 100 102 102  CO2 26 26 27 26   GLUCOSE 90 87 125* 126*  BUN 8 8 11 8   CREATININE 0.54 0.53 0.50 0.56  CALCIUM 8.6* 8.5* 8.8* 8.4*   GFR: Estimated Creatinine Clearance: 99 mL/min (by C-G formula based on SCr of 0.56 mg/dL). Liver Function Tests: Recent Labs  Lab 10/17/19 0301  AST 17  ALT 18  ALKPHOS 86  BILITOT 0.6  PROT 6.1*  ALBUMIN 3.0*   No results for input(s): LIPASE, AMYLASE in the last 168 hours. No results for input(s): AMMONIA in the last 168 hours. Coagulation Profile: No results for input(s): INR, PROTIME in the last 168 hours. Cardiac Enzymes: No results for input(s): CKTOTAL, CKMB, CKMBINDEX, TROPONINI in the last 168 hours. BNP (last 3 results) No results for input(s): PROBNP in the last 8760 hours. HbA1C: No results for input(s): HGBA1C in the last 72 hours. CBG: No results for input(s): GLUCAP in the last 168 hours. Lipid Profile: No results for input(s): CHOL, HDL, LDLCALC, TRIG, CHOLHDL, LDLDIRECT in the last 72 hours. Thyroid Function Tests: No results for input(s): TSH, T4TOTAL, FREET4, T3FREE, THYROIDAB in the last 72 hours.  Anemia Panel: No results for input(s): VITAMINB12, FOLATE, FERRITIN, TIBC, IRON, RETICCTPCT in the last 72 hours. Urine analysis:    Component Value Date/Time   COLORURINE YELLOW 10/13/2019 1710   APPEARANCEUR HAZY (A) 10/13/2019 1710   LABSPEC 1.029 10/13/2019 1710   PHURINE 5.0 10/13/2019 1710   GLUCOSEU NEGATIVE 10/13/2019 1710   HGBUR NEGATIVE 10/13/2019 1710   BILIRUBINUR NEGATIVE 10/13/2019 1710   KETONESUR 80 (A) 10/13/2019 1710   PROTEINUR 30 (A) 10/13/2019 1710   UROBILINOGEN 1.0 01/05/2013 2116   NITRITE NEGATIVE 10/13/2019 1710    LEUKOCYTESUR NEGATIVE 10/13/2019 1710   Sepsis Labs: (procalcitonin:4,lacticidven:4)  ) Recent Results (from the past 240 hour(s))  Culture, blood (Routine x 2)     Status: None   Collection Time: 10/13/19  8:55 AM   Specimen: BLOOD  Result Value Ref Range Status   Specimen Description BLOOD LEFT ANTECUBITAL  Final   Special Requests   Final    BOTTLES DRAWN AEROBIC ONLY Blood Culture adequate volume   Culture   Final    NO GROWTH 5 DAYS Performed at [facility], [address], [place], [place] [address]    Report Status 10/18/2019 FINAL  Final  Culture, blood (Routine x 2)     Status: None   Collection Time: 10/13/19 11:44 AM   Specimen: BLOOD  Result Value Ref Range Status   Specimen Description BLOOD RIGHT ANTECUBITAL  Final   Special Requests   Final    BOTTLES DRAWN AEROBIC AND ANAEROBIC Blood Culture results may not be optimal due to an inadequate volume of blood received in culture bottles   Culture   Final    NO GROWTH 5 DAYS Performed at [facility], [address] [place], [place] [address]    Report Status 10/18/2019 FINAL  Final  SARS CORONAVIRUS 2 (TAT 6-24 HRS) Nasopharyngeal Nasopharyngeal Swab     Status: None   Collection Time: 10/13/19  8:05 PM   Specimen: Nasopharyngeal Swab  Result Value Ref Range Status   SARS Coronavirus 2 NEGATIVE NEGATIVE Final    Comment: (NOTE) SARS-CoV-2 target nucleic acids are NOT DETECTED. The SARS-CoV-2 RNA is generally detectable in upper and lower respiratory specimens during the acute phase of infection. Negative results do not preclude SARS-CoV-2 infection, do not rule out co-infections with other pathogens, and should not be used as the sole basis for treatment or other patient management decisions. Negative results must be combined with clinical observations, patient history, and epidemiological information. The expected result is Negative. Fact Sheet for Patients:  HairSlick.no Fact Sheet for Healthcare Providers: quierodirigir.com This test is not yet approved or cleared by the [place] FDA and  has been authorized for detection and/or diagnosis of SARS-CoV-2 by FDA under an Emergency Use Authorization (EUA). This EUA will remain  in effect (meaning this test can be used) for the duration of the COVID-19 declaration under Section 56 4(b)(1) of the Act, 21 U.S.C. section 360bbb-3(b)(1), unless the authorization is terminated or revoked sooner. Performed at [facility], [address] [place], [place] [address]   Anaerobic culture     Status: None   Collection Time: 10/17/19 10:05 AM   Specimen: PATH Cytology CSF; Cerebrospinal Fluid  Result Value Ref Range Status   Specimen Description CSF  Final   Special Requests NONE  Final   Culture   Final    NO ANAEROBES ISOLATED Performed at [facility], [address], [place], [place]  [address]    Report Status 10/22/2019 FINAL  Final  CSF culture     Status: None   Collection Time: 10/17/19 10:05 AM   Specimen: PATH Cytology CSF; Cerebrospinal Fluid  Result Value Ref Range Status   Specimen Description CSF  Final   Special Requests NONE  Final   Gram Stain   Final    WBC PRESENT,BOTH PMN AND MONONUCLEAR NO ORGANISMS SEEN CYTOSPIN SMEAR    Culture   Final    NO GROWTH 3 DAYS Performed at [facility], [address], [place], [place] [address]    Report Status 10/20/2019 FINAL  Final  Culture, blood (routine x 2)     Status: None (Preliminary result)   Collection Time: 10/19/19 10:12 AM   Specimen: BLOOD LEFT HAND  Result Value Ref Range Status   Specimen Description BLOOD LEFT HAND  Final   Special Requests   Final    BOTTLES DRAWN AEROBIC ONLY Blood Culture adequate volume   Culture   Final    NO GROWTH 3 DAYS Performed at [facility], [address], [place], [place] [address]    Report Status  PENDING  Incomplete  Culture, blood (routine x 2)     Status: None (Preliminary result)   Collection Time: 10/19/19 10:24 AM   Specimen: BLOOD LEFT HAND  Result Value Ref Range Status   Specimen Description BLOOD LEFT HAND  Final   Special Requests   Final    BOTTLES DRAWN AEROBIC ONLY Blood Culture adequate volume   Culture   Final    NO GROWTH 3 DAYS Performed at [facility], [address], [place], [place] [address]    Report Status PENDING  Incomplete      Studies: No results found.  Scheduled Meds: . [START ON 10/29/2019] apixaban  5 mg Oral BID   Followed by  . apixaban  10 mg Oral BID  . phenobarbital  97.2 mg Oral QHS  . polyethylene glycol  17 g Oral BID  . senna-docusate  2 tablet Oral BID    Continuous Infusions:   LOS: 8 days     Darlin Drop, MD [facility] Pager [phone]  If 7PM-7AM, please contact night-coverage [email or web address] Password [email or web address] 10/22/2019, 2:36 PM

## 2019-10-23 ENCOUNTER — Inpatient Hospital Stay (HOSPITAL_COMMUNITY): Payer: Medicaid Other

## 2019-10-23 LAB — ENTEROVIRUS PCR: Enterovirus PCR: NEGATIVE

## 2019-10-23 LAB — LACTIC ACID, PLASMA: Lactic Acid, Venous: 0.9 mmol/L (ref 0.5–1.9)

## 2019-10-23 MED ORDER — APIXABAN 5 MG PO TABS: ORAL_TABLET | ORAL | 0 refills | Status: DC

## 2019-10-23 MED ORDER — KETOROLAC TROMETHAMINE 30 MG/ML IJ SOLN
30.0000 mg | Freq: Once | INTRAMUSCULAR | Status: DC
  Filled 2019-10-23: qty 1

## 2019-10-23 MED ORDER — ACETAMINOPHEN 500 MG PO TABS
1000.0000 mg | ORAL_TABLET | Freq: Four times a day (QID) | ORAL | Status: AC
  Administered 2019-10-23: 1000 mg via ORAL
  Filled 2019-10-23: qty 2

## 2019-10-23 MED ORDER — SIMETHICONE 40 MG/0.6ML PO SUSP
40.0000 mg | Freq: Four times a day (QID) | ORAL | Status: DC | PRN
  Administered 2019-10-23: 40 mg via ORAL
  Filled 2019-10-23 (×2): qty 0.6

## 2019-10-23 MED ORDER — MORPHINE SULFATE (PF) 2 MG/ML IV SOLN
INTRAVENOUS | Status: AC
  Administered 2019-10-23: 1 mg via INTRAVENOUS
  Filled 2019-10-23: qty 1

## 2019-10-23 MED ORDER — DICYCLOMINE HCL 10 MG/5ML PO SOLN
20.0000 mg | Freq: Once | ORAL | Status: AC
  Administered 2019-10-23: 20 mg via ORAL
  Filled 2019-10-23: qty 10

## 2019-10-23 MED ORDER — MORPHINE SULFATE (PF) 2 MG/ML IV SOLN: 1.0000 mg | Freq: Once | INTRAVENOUS | Status: DC

## 2019-10-23 MED ORDER — ALUM & MAG HYDROXIDE-SIMETH 200-200-20 MG/5ML PO SUSP
30.0000 mL | Freq: Once | ORAL | Status: AC
  Administered 2019-10-23: 30 mL via ORAL
  Filled 2019-10-23: qty 30

## 2019-10-23 MED ORDER — PROMETHAZINE HCL 25 MG/ML IJ SOLN
6.2500 mg | Freq: Once | INTRAMUSCULAR | Status: AC
  Administered 2019-10-23: 6.25 mg via INTRAVENOUS
  Filled 2019-10-23: qty 1

## 2019-10-23 MED ORDER — DICYCLOMINE HCL 10 MG/5ML PO SOLN
20.0000 mg | Freq: Three times a day (TID) | ORAL | Status: DC
  Administered 2019-10-23 – 2019-10-24 (×2): 20 mg via ORAL
  Filled 2019-10-23 (×4): qty 10

## 2019-10-23 MED ORDER — MORPHINE SULFATE (PF) 2 MG/ML IV SOLN
1.0000 mg | INTRAVENOUS | Status: DC | PRN
  Administered 2019-10-23 – 2019-11-06 (×28): 1 mg via INTRAVENOUS
  Filled 2019-10-23 (×29): qty 1

## 2019-10-23 MED ORDER — SIMETHICONE 80 MG PO CHEW: 80.0000 mg | CHEWABLE_TABLET | Freq: Four times a day (QID) | ORAL | Status: DC

## 2019-10-23 MED ORDER — FLEET ENEMA 7-19 GM/118ML RE ENEM
1.0000 | ENEMA | Freq: Once | RECTAL | Status: AC
  Administered 2019-10-23: 1 via RECTAL
  Filled 2019-10-23: qty 1

## 2019-10-23 MED ORDER — SIMETHICONE 40 MG/0.6ML PO SUSP
80.0000 mg | Freq: Four times a day (QID) | ORAL | Status: DC
  Administered 2019-10-23: 80 mg via ORAL
  Filled 2019-10-23 (×2): qty 1.2

## 2019-10-23 MED ORDER — LIDOCAINE VISCOUS HCL 2 % MT SOLN
15.0000 mL | Freq: Once | OROMUCOSAL | Status: AC
  Administered 2019-10-23: 15 mL via ORAL
  Filled 2019-10-23: qty 15

## 2019-10-23 MED ORDER — POLYETHYLENE GLYCOL 3350 17 G PO PACK: 17.0000 g | PACK | Freq: Every day | ORAL | Status: DC | PRN

## 2019-10-23 MED FILL — ELIQUIS STARTER PACK 5 MG T: 5 | 30 days supply | Qty: 74 | Fill #0

## 2019-10-23 NOTE — Progress Notes (Signed)
[name] informed of pt's temp 103F.

## 2019-10-23 NOTE — Discharge Summary (Addendum)
Discharge Summary  Tracey Graham ZOX:[id number] DOB: [birth year]  PCP: [facility]  Admit date: 10/13/2019 Discharge date: 10/23/2019  Time spent: 35 minutes  Recommendations for Outpatient Follow-up:  1. Follow-up with infectious disease 2. Follow-up with your PCP 3. Take your medications as prescribed 4. Fall precautions.  Discharge Diagnoses:  Active Hospital Problems   Diagnosis Date Noted   Fever of unknown origin (FUO) 10/13/2019   Lower abdominal pain    Nuchal rigidity    Aseptic meningitis    Constipation    Deep vein thrombosis (DVT) of femoral vein of left lower extremity (HCC)    Fever of unknown origin 10/14/2019   Hydrocephalus (HCC)    VP (ventriculoperitoneal) shunt status    Encephalopathy     Resolved Hospital Problems  No resolved problems to display.    Discharge Condition: Stable  Diet recommendation: Resume previous diet.  Vitals:   10/23/19 0426 10/23/19 0800  BP: 130/75   Pulse: (!) 126 (!) 102  Resp:    Temp: 98.5 F (36.9 C)   SpO2: 97%     History of present illness:  Tracey Tomlinsonis a 33 y.o.femalewith medical history significant forcerebral palsy,wheelchair-bound and fully dependent with ADLs,hydrocephalus s/p VP shunt, seizure disorder, and intellectual disability who presents to the ED for evaluation of fevers and feeling unwell. -In emergency room was febrile to 101.8, white count was 11.3, rest of her electrolytes were unremarkable, urinalysis was negative, chest x-ray was unremarkable, COVID-19 PCR was negative -CT abdomen/pelvis without contrast showed trace lower abdominal pelvic free fluid per radiology may be physiologic or related to chronic VP shunt tubing. No fluid collection, abscess, or acute intra-abdominal or pelvic finding seen. -VP shunt series showed visualized VP shunt catheter intact on skull, chest, and abdominal x-rays. CT head without contrast showed a left frontal approach  CSF shunt which appeared stable along with decompressed ventricles since 2014. No acute intracranial abnormality noted. -Subsequent work-up noted left leg DVT, started anticoagulation, SQ full dose lovenox BID. -slow improvement but low grade fevers persist. LP done to r/o meningitis.  LP fluid analysis showed pleocytosis, elevated protein and lymphocytic predominance with no bacteria seen on Gram stain.  HSV PCR and enterovirus PCR pending.  On 10/19/2019 started on p.o. Decadron 4 mg every 6 hours x3 days by infectious disease and Tylenol as needed.  No NSAIDs due to anticoagulation on full dose subcu Lovenox.  10/23/19: Patient was seen and examined at her bedside this morning.  Per her mother she has abdominal pain and is bloated.  Simethicone ordered 4 times daily.  Obtained abdominal x-ray which was unrevealing.  No sign of bowel obstruction or perforation.  Lactic acid pending.  Per her mother she is constipated.  Last bowel movement recorded on 10/22/19 evening at 2000.  Enema ordered once. GI cocktail also ordered to relieve discomfort.   Hospital Course:  Principal Problem:   Fever of unknown origin (FUO) Active Problems:   Hydrocephalus (HCC)   VP (ventriculoperitoneal) shunt status   Encephalopathy   Fever of unknown origin   Lower abdominal pain   Nuchal rigidity   Aseptic meningitis   Constipation   Deep vein thrombosis (DVT) of femoral vein of left lower extremity (HCC)   Suspected Lovenox induced fever Recently diagnosed with acute DVT of left lower extremity Started full dose anticoagulation subcu Lovenox on 10/14/2019 Noted fevers while on subcu full dose Lovenox. Procalcitonin less than 0.10 on 10/14/2019 Lactic acid 1.1 on 10/13/2019 Urine analysis, chest x-ray,  right upper quadrant abdominal ultrasound, all benign. LP fluid analysis showed lymphocytosis, pleocytosis with no bacteria seen on Gram stain.  HSV PCR negative and enterovirus PCR negative. VDRL  negative Blood cultures x2 peripherally ordered on 10/19/2019, no growth x4 days.   Completed PO Decadron 4 mg every 6 hours x 3 days on 10/21/19. Presumed Lovenox induced fevers  Can occur in a small percentage of population. Off Lovenox since 10/21/2019, started on Eliquis Afebrile in the past 48 hours. Lactic acid 0.9 on 10/20/2019. Negative procalcitonin less than 0.10 on 10/14/2019, bacteria etiology less likely.  Presumed aseptic meningitis Cultures and HSV PCR AND ENTEROVIRUS PCR NEGATIVE Infectious disease followed Continue supportive care Completed 3 days of Decadron on 10/21/2019  Acute left lower extremity DVT. Continue anticoagulation with Eliquis Follow-up with PCP  Seizure disorder Continue AED Follow-up with your provider  Abdominal bloating Abdominal x-ray done on 10/23/2019 unrevealing no sign of obstruction or perforation. Lactic acid 0.9 Afebrile Physical exam benign Received simethicone GI cocktail ordered x1 dose Had a bowel movement on 10/23/19  Resolved post repletion: Hypokalemia Potassium 3.4>> 4.3 on 10/20/2019. Repleted with IV 20 mEq  Hydrocephalus s/p chronic VP shunt: VP shunt series showed visualized VP shunt catheter intact on imaging,. CT head without contrast showed a left frontal approach CSF shunt which appeared stable along with decompressed ventricles since 2014.   Chronic constipation: Resolved post laxative She takes MiraLAX at home daily per her mother -continueMiraLAX and Dulcolax supp as needed -BM overnight 10/22/2019 at 2000 We will give enema.  Her mother requests that she has a bowel movement prior to discharge due to concern for constipation. Update: Had a bowel  Movement on 10/23/19.  DVT prophylaxis:Eliquis Code Status:Full code, confirmed with mother   Consultants:  Infectious disease  Interventional radiology   Procedures:  Lumbar puncture by interventional radiology on  10/17/2019.    Discharge Exam: BP 130/75 (BP Location: Left Arm)    Pulse (!) 102    Temp 98.5 F (36.9 C) (Oral)    Resp 20    Ht 5\' 4"  (1.626 m)    Wt 74.7 kg    LMP 10/01/2019    SpO2 97%    BMI 28.27 kg/m   General: 33 y.o. year-old female well developed well nourished in no acute distress.  Alert and minimally interactive.  Cardiovascular: Regular rate and rhythm with no rubs or gallops.  No thyromegaly or JVD noted.    Respiratory: Clear to auscultation with no wheezes or rales. Good inspiratory effort.  Abdomen: Soft nontender nondistended with normal bowel sounds x4 quadrants.  Musculoskeletal: Trace lower extremity edema. 2/4 pulses in all 4 extremities.  Psychiatry: Mood is appropriate for condition and setting  Discharge Instructions You were cared for by a hospitalist during your hospital stay. If you have any questions about your discharge medications or the care you received while you were in the hospital after you are discharged, you can call the unit and asked to speak with the hospitalist on call if the hospitalist that took care of you is not available. Once you are discharged, your primary care physician will handle any further medical issues. Please note that NO REFILLS for any discharge medications will be authorized once you are discharged, as it is imperative that you return to your primary care physician (or establish a relationship with a primary care physician if you do not have one) for your aftercare needs so that they can reassess your need for medications and monitor  your lab values.   Allergies as of 10/23/2019      Reactions   Penicillins Swelling   Did not have reaction to 5d of amoxicillin Has patient had a PCN reaction causing immediate rash, facial/tongue/throat swelling, SOB or lightheadedness with hypotension: Yes Has patient had a PCN reaction causing severe rash involving mucus membranes or skin necrosis: No Has patient had a PCN reaction that  required hospitalization No Has patient had a PCN reaction occurring within the last 10 years: No If all of the above answers are "NO", then may proceed with Cephalosporin use.      Medication List    STOP taking these medications   cetirizine 1 MG/ML syrup Commonly known as: ZYRTEC   erythromycin ophthalmic ointment   guaiFENesin 100 MG/5ML liquid Commonly known as: ROBITUSSIN   ibuprofen 100 MG/5ML suspension Commonly known as: ADVIL   sodium chloride 0.65 % Soln nasal spray Commonly known as: OCEAN     TAKE these medications   acetaminophen 160 MG/5ML liquid Commonly known as: TYLENOL Take 500 mg by mouth every 4 (four) hours as needed for fever.   apixaban 5 MG Tabs tablet Commonly known as: Eliquis Take 2 tablets ( ) twice daily for 7 days, then 1 tablet ( ) twice daily   diphenhydrAMINE 12.5 MG/5ML syrup Commonly known as: BENYLIN Take 10 mLs (25 mg total) by mouth every 4 (four) hours as needed for allergies.   docusate 50 MG/5ML liquid Commonly known as: COLACE Take 10 mLs (100 mg total) by mouth daily as needed for mild constipation or moderate constipation.   fluticasone 50 MCG/ACT nasal spray Commonly known as: FLONASE Place 2 sprays into both nostrils daily.   PHENobarbital 97.2 MG tablet Commonly known as: LUMINAL Take 97.2 mg by mouth at bedtime.   polyethylene glycol 17 g packet Commonly known as: MiraLax Take 17 g by mouth daily.      Allergies  Allergen Reactions   Penicillins Swelling    Did not have reaction to 5d of amoxicillin Has patient had a PCN reaction causing immediate rash, facial/tongue/throat swelling, SOB or lightheadedness with hypotension: Yes Has patient had a PCN reaction causing severe rash involving mucus membranes or skin necrosis: No Has patient had a PCN reaction that required hospitalization No Has patient had a PCN reaction occurring within the last 10 years: No If all of the above answers are "NO", then may  proceed with Cephalosporin use.    Follow-up Information    [facility]. Call in 1 day(s).   Specialty: Family Medicine Why: please all for a post hospital follow up appointment. Contact information: [address] [place] [address] [phone]        Judyann Munson, MD. Call in 1 day(s).   Specialty: Infectious Diseases Why: please call for a post hospital follow up appointment. [address] [place] [address] [phone]            The results of significant diagnostics from this hospitalization (including imaging, microbiology, ancillary and laboratory) are listed below for reference.    Significant Diagnostic Studies: Ct Abdomen Pelvis Wo Contrast  Result Date: 10/13/2019 CLINICAL DATA:  Generalized abdominal pain with nausea and fever EXAM: CT ABDOMEN AND PELVIS WITHOUT CONTRAST TECHNIQUE: Multidetector CT imaging of the abdomen and pelvis was performed following the standard protocol without IV contrast. COMPARISON:  01/06/2013 FINDINGS: Lower chest: No acute abnormality. Hepatobiliary: Limited without IV contrast. No large focal hepatic abnormality or intrahepatic  biliary dilatation. Gallbladder and biliary system unremarkable. Common bile duct nondilated. Pancreas: Unremarkable. No pancreatic ductal dilatation or surrounding inflammatory changes. Spleen: Normal in size without focal abnormality. Adrenals/Urinary Tract: Adrenal glands are unremarkable. Kidneys are normal, without renal calculi, focal lesion, or hydronephrosis. Bladder is unremarkable. Stomach/Bowel: Negative for bowel obstruction, significant dilatation, ileus, or free air. Appendix is unremarkable and retrocecal in position. No acute inflammatory process, fluid collection, abscess, hemorrhage or hematoma Vascular/Lymphatic: Limited without IV contrast. No aneurysm. No adenopathy. Reproductive: Uterus and adnexa normal in size. Trace pelvic free  fluid may be physiologic or related to the chronic VP shunt tubing. Other: No inguinal or abdominal wall hernia. Musculoskeletal: Degenerative changes of the spine. IMPRESSION: No acute intra-abdominal or pelvic finding by noncontrast CT. Trace lower abdominopelvic free fluid may be physiologic or related to the chronic VP shunt tubing. No fluid collection or abscess. Electronically Signed   By: Judie Petit.  Shick M.D.   On: 10/13/2019 12:40   Dg Skull 1-3 Views  Result Date: 10/13/2019 CLINICAL DATA:  33 year old female with fever and right-sided abdominal pain since yesterday. Evaluate for shunt malfunction. EXAM: SKULL - 1-3 VIEW COMPARISON:  No priors. FINDINGS: Left-sided ventriculoperitoneal shunt catheter noted, visualized portions of which are intact. IMPRESSION: 1. Visualized ventriculoperitoneal shunt catheter is intact. Electronically Signed   By: Trudie Reed M.D.   On: 10/13/2019 18:30   Dg Chest 1 View  Result Date: 10/13/2019 CLINICAL DATA:  33 year old female with history of right-sided abdominal pain since yesterday. Evaluate for potential shunt malfunction. EXAM: CHEST  1 VIEW COMPARISON:  Chest x-ray 10/13/2019. FINDINGS: The heart size and mediastinal contours are within normal limits. Both lungs are clear. The visualized skeletal structures are unremarkable. Surgical clips in the left upper mediastinum. Ventriculoperitoneal shunt catheter projecting over the left hemithorax and upper left abdomen, which appears intact without obvious shunt discontinuities or kinks. IMPRESSION: 1. Ventriculoperitoneal shunt catheter as visualized appears intact. 2. No radiographic evidence of acute cardiopulmonary disease. Electronically Signed   By: Trudie Reed M.D.   On: 10/13/2019 18:22   Dg Chest 2 View  Result Date: 10/14/2019 CLINICAL DATA:  Initial evaluation for fever of unknown origin. EXAM: CHEST - 2 VIEW COMPARISON:  Prior radiograph from 10/13/2019. FINDINGS: Cardiac and mediastinal  silhouettes are stable, and remain within normal limits. Surgical clip overlies the upper left mediastinum. Lungs normally inflated. No focal infiltrates. No edema or effusion. No pneumothorax. VP shunt catheter overlies the left chest, stable. Osseous structures are unchanged. IMPRESSION: Stable appearance of the chest, with no active cardiopulmonary disease identified. Electronically Signed   By: Rise Mu M.D.   On: 10/14/2019 07:28   Dg Chest 2 View  Result Date: 10/13/2019 CLINICAL DATA:  Fever and right-sided abdominal pain since yesterday. EXAM: CHEST - 2 VIEW COMPARISON:  01/09/2016. 06/25/2015. FINDINGS: Heart size is normal. Ductus clip present within the mediastinum. The lungs are clear. The vascularity is normal. No effusions. VP shunt overlies the left chest. IMPRESSION: No active disease. Electronically Signed   By: Paulina Fusi M.D.   On: 10/13/2019 09:38   Dg Abd 1 View  Result Date: 10/13/2019 CLINICAL DATA:  33 year old female.  Evaluate for shunt malfunction. EXAM: ABDOMEN - 1 VIEW COMPARISON:  Abdominal radiograph 01/09/2016. FINDINGS: Ventriculoperitoneal shunt catheter seen projecting over the left side of the abdomen, coiled into the mid lower abdomen and upper pelvis. Visualized portions of the catheter appear intact, although there appears to be extra catheter fragments in the low abdomen and  upper pelvis. Gas and stool is noted throughout the colon extending to the level of the distal rectum. No definite pathologic dilatation of small bowel. IMPRESSION: 1. Ventriculoperitoneal shunt catheter appears intact. 2. Residual catheter fragment in the lower abdomen, similar to prior examinations. 3. Nonobstructive bowel gas pattern. Electronically Signed   By: Trudie Reed M.D.   On: 10/13/2019 18:28   Ct Head Wo Contrast  Result Date: 10/13/2019 CLINICAL DATA:  33 year old female with unexplained altered mental status. EXAM: CT HEAD WITHOUT CONTRAST TECHNIQUE:  Contiguous axial images were obtained from the base of the skull through the vertex without intravenous contrast. COMPARISON:  [facility] head CT 01/05/2013. FINDINGS: Brain: Scaphocephaly. Dysgenesis of the corpus callosum. Mildly dysplastic appearing ventricles which remain decompressed since 2014 in the setting of a left superior frontal approach CSF shunt. Parietal and occipital lobe chronic volume loss. Relatively normal appearing posterior fossa. No midline shift, ventriculomegaly, mass effect, evidence of mass lesion, intracranial hemorrhage or evidence of cortically based acute infarction. Vascular: No suspicious intracranial vascular hyperdensity. Skull: Chronic superior frontal burr holes. No acute osseous abnormality identified. Sinuses/Orbits: Visualized paranasal sinuses and mastoids are stable and well pneumatized. Other: Left convexity CSF reservoir with shunt tubing continuing caudally in the left neck. Partial calcification of the catheter beginning in the left suboccipital region, but no adverse features. Chronic postoperative changes to the contralateral right scalp. Rightward gaze deviation. IMPRESSION: 1. No acute intracranial abnormality. 2. Left frontal approach CSF shunt appears stable along with decompressed ventricles since 2014. 3. Underlying dysgenesis of the corpus callosum, scaphocephaly, chronic parieto-occipital cerebral volume loss. Electronically Signed   By: Odessa Fleming M.D.   On: 10/13/2019 18:40   Dg Chest Port 1 View  Result Date: 10/23/2019 CLINICAL DATA:  Chest pain. EXAM: PORTABLE CHEST 1 VIEW COMPARISON:  October 14, 2019. FINDINGS: The heart size and mediastinal contours are within normal limits. No pneumothorax or pleural effusion is noted. Right lung is clear. Mild left basilar atelectasis is noted. The visualized skeletal structures are unremarkable. Left-sided ventriculoperitoneal shunt is again noted with associated calcification. IMPRESSION: Mild left  basilar subsegmental atelectasis. Left-sided ventriculoperitoneal shunt is noted with associated calcification. Electronically Signed   By: Lupita Raider M.D.   On: 10/23/2019 08:20   Dg Abd Portable 1v  Result Date: 10/23/2019 CLINICAL DATA:  Epigastric abdominal pain. EXAM: PORTABLE ABDOMEN - 1 VIEW COMPARISON:  October 13, 2019. FINDINGS: The bowel gas pattern is normal. No free air is noted. Distal tip of ventriculoperitoneal shunt is seen in the central portion of the abdomen. No abnormal calcifications are noted. IMPRESSION: Distal tip of left-sided ventriculoperitoneal shunt is seen in central portion of the abdomen. There is no evidence of bowel obstruction or ileus. Electronically Signed   By: Lupita Raider M.D.   On: 10/23/2019 08:22   Vas [place] Lower Extremity Venous (dvt)  Result Date: 10/14/2019  Lower Venous Study Indications: Fever, elevated D-Dimer.  Risk Factors: Cerebal Palsy, wheelchair bound, abdominal pain. Limitations: Body habitus and edema, depth of vessels. Comparison Study: No prior study on file Performing Technologist: Sherren Kerns RVS  Examination Guidelines: A complete evaluation includes B-mode imaging, spectral Doppler, color Doppler, and power Doppler as needed of all accessible portions of each vessel. Bilateral testing is considered an integral part of a complete examination. Limited examinations for reoccurring indications may be performed as noted.  +---------+---------------+---------+-----------+----------+-------------------+  RIGHT     Compressibility Phasicity Spontaneity Properties Thrombus Aging       +---------+---------------+---------+-----------+----------+-------------------+  CFV       Full            Yes       Yes                                         +---------+---------------+---------+-----------+----------+-------------------+  SFJ       Full                                                                   +---------+---------------+---------+-----------+----------+-------------------+  FV Prox   Full                                                                  +---------+---------------+---------+-----------+----------+-------------------+  FV Mid    Full                                                                  +---------+---------------+---------+-----------+----------+-------------------+  FV Distal Full                                                                  +---------+---------------+---------+-----------+----------+-------------------+  PFV       Full                                                                  +---------+---------------+---------+-----------+----------+-------------------+  POP                                                        Patent by color and                                                              Doppler              +---------+---------------+---------+-----------+----------+-------------------+  PERO  Not visualized       +---------+---------------+---------+-----------+----------+-------------------+   +---------+---------------+---------+-----------+----------+-------------------+  LEFT      Compressibility Phasicity Spontaneity Properties Thrombus Aging       +---------+---------------+---------+-----------+----------+-------------------+  CFV       Full            Yes       Yes                                         +---------+---------------+---------+-----------+----------+-------------------+  SFJ       Full                                                                  +---------+---------------+---------+-----------+----------+-------------------+  FV Prox   Full                                                                  +---------+---------------+---------+-----------+----------+-------------------+  FV Mid    Full                                                                   +---------+---------------+---------+-----------+----------+-------------------+  FV Distal Full                                                                  +---------+---------------+---------+-----------+----------+-------------------+  PFV       Full                                                                  +---------+---------------+---------+-----------+----------+-------------------+  POP                                                        Patent by color and                                                              Dopple               +---------+---------------+---------+-----------+----------+-------------------+  PTV       None                                             Acute                +---------+---------------+---------+-----------+----------+-------------------+  PERO      None                                             Acute                +---------+---------------+---------+-----------+----------+-------------------+     Summary: Right: There is no evidence of deep vein thrombosis in the lower extremity. However, portions of this examination were limited- see technologist comments above. Left: Findings consistent with acute deep vein thrombosis involving the left posterior tibial veins, and left peroneal veins.  *See table(s) above for measurements and observations. Electronically signed by Coral Else MD on 10/14/2019 at 4:36:11 PM.    Final    Dg Fl Guided Lumbar Puncture  Result Date: 10/17/2019 CLINICAL DATA:  Fever. EXAM: DIAGNOSTIC LUMBAR PUNCTURE UNDER FLUOROSCOPIC GUIDANCE FLUOROSCOPY TIME:  Fluoroscopy Time:  18 seconds Radiation Exposure Index (if provided by the fluoroscopic device): 1.8 mGy Number of Acquired Spot Images: 0 PROCEDURE: Informed consent was obtained from the patient prior to the procedure, including potential complications of headache, allergy, and pain. With the patient prone, the lower back was prepped with Betadine. 1% Lidocaine was used  for local anesthesia. Lumbar puncture was performed at the L4-L5 level using a 21 gauge needle with return of clear CSF with grossly normal opening pressure to slightly diminished, CSF flowed slowly mild blood-tinged was noted on later samples perhaps from adjacent venous plexus, 7 ml of CSF were obtained for laboratory studies. The patient tolerated the procedure well and there were no apparent complications. IMPRESSION: Technically successful L4-L5 lumbar puncture without signs of complication. Electronically Signed   By: Donzetta Kohut M.D.   On: 10/17/2019 10:19   US Abdomen Limited Ruq  Result Date: 10/14/2019 CLINICAL DATA:  Abdominal pain x2 days EXAM: ULTRASOUND ABDOMEN LIMITED RIGHT UPPER QUADRANT COMPARISON:  None. FINDINGS: Gallbladder: No gallstones, gallbladder wall thickening, or pericholecystic fluid. Negative sonographic Murphy's sign. Common bile duct: Diameter: 4 mm Liver: Poorly visualized. No focal lesion identified. Within normal limits in parenchymal echogenicity. Portal vein is patent on color Doppler imaging with normal direction of blood flow towards the liver. Other: None. IMPRESSION: Negative right upper quadrant ultrasound. Electronically Signed   By: Charline Bills M.D.   On: 10/14/2019 12:47    Microbiology: Recent Results (from the past 240 hour(s))  SARS CORONAVIRUS 2 (TAT 6-24 HRS) Nasopharyngeal Nasopharyngeal Swab     Status: None   Collection Time: 10/13/19  8:05 PM   Specimen: Nasopharyngeal Swab  Result Value Ref Range Status   SARS Coronavirus 2 NEGATIVE NEGATIVE Final    Comment: (NOTE) SARS-CoV-2 target nucleic acids are NOT DETECTED. The SARS-CoV-2 RNA is generally detectable in upper and lower respiratory specimens during the acute phase of infection. Negative results do not preclude SARS-CoV-2 infection, do not rule out co-infections with other pathogens, and should not be used as the sole basis for treatment or other patient management  decisions. Negative results must be  combined with clinical observations, patient history, and epidemiological information. The expected result is Negative. Fact Sheet for Patients: HairSlick.no Fact Sheet for Healthcare Providers: quierodirigir.com This test is not yet approved or cleared by the [place] FDA and  has been authorized for detection and/or diagnosis of SARS-CoV-2 by FDA under an Emergency Use Authorization (EUA). This EUA will remain  in effect (meaning this test can be used) for the duration of the COVID-19 declaration under Section 56 4(b)(1) of the Act, 21 U.S.C. section 360bbb-3(b)(1), unless the authorization is terminated or revoked sooner. Performed at [facility], [address] [place], [place] [address]   Anaerobic culture     Status: None   Collection Time: 10/17/19 10:05 AM   Specimen: PATH Cytology CSF; Cerebrospinal Fluid  Result Value Ref Range Status   Specimen Description CSF  Final   Special Requests NONE  Final   Culture   Final    NO ANAEROBES ISOLATED Performed at [facility], [address] [place], [place] [address]    Report Status 10/22/2019 FINAL  Final  CSF culture     Status: None   Collection Time: 10/17/19 10:05 AM   Specimen: PATH Cytology CSF; Cerebrospinal Fluid  Result Value Ref Range Status   Specimen Description CSF  Final   Special Requests NONE  Final   Gram Stain   Final    WBC PRESENT,BOTH PMN AND MONONUCLEAR NO ORGANISMS SEEN CYTOSPIN SMEAR    Culture   Final    NO GROWTH 3 DAYS Performed at [facility], [address], [place], [place] [address]    Report Status 10/20/2019 FINAL  Final  Culture, blood (routine x 2)     Status: None (Preliminary result)   Collection Time: 10/19/19 10:12 AM   Specimen: BLOOD LEFT HAND  Result Value Ref Range Status   Specimen Description BLOOD LEFT HAND  Final   Special Requests   Final    BOTTLES  DRAWN AEROBIC ONLY Blood Culture adequate volume   Culture   Final    NO GROWTH 4 DAYS Performed at [facility], [address], [place], [place] [address]    Report Status PENDING  Incomplete  Culture, blood (routine x 2)     Status: None (Preliminary result)   Collection Time: 10/19/19 10:24 AM   Specimen: BLOOD LEFT HAND  Result Value Ref Range Status   Specimen Description BLOOD LEFT HAND  Final   Special Requests   Final    BOTTLES DRAWN AEROBIC ONLY Blood Culture adequate volume   Culture   Final    NO GROWTH 4 DAYS Performed at [facility], [address] [place], [place] [address]    Report Status PENDING  Incomplete     Labs: Basic Metabolic Panel: Recent Labs  Lab 10/17/19 0301 10/18/19 0808 10/20/19 0650  NA 138 139 135  K 3.4* 4.4 4.3  CL 100 102 102  CO2 26 27 26   GLUCOSE 87 125* 126*  BUN 8 11 8   CREATININE 0.53 0.50 0.56  CALCIUM 8.5* 8.8* 8.4*   Liver Function Tests: Recent Labs  Lab 10/17/19 0301  AST 17  ALT 18  ALKPHOS 86  BILITOT 0.6  PROT 6.1*  ALBUMIN 3.0*   No results for input(s): LIPASE, AMYLASE in the last 168 hours. No results for input(s): AMMONIA in the last 168 hours. CBC: Recent Labs  Lab 10/17/19 0301 10/18/19 1308 10/20/19 0650 10/22/19 1331  WBC 7.7 8.7 11.5* 13.9*  NEUTROABS  --   --  9.8*  --   HGB 13.6 14.5 12.9 13.7  HCT 38.4 41.1 35.8* 37.9  MCV 79.8* 80.4 79.2* 79.3*  PLT 235 233 240 264   Cardiac Enzymes: No results for input(s): CKTOTAL, CKMB, CKMBINDEX, TROPONINI in the last 168 hours. BNP: BNP (last 3 results) No results for input(s): BNP in the last 8760 hours.  ProBNP (last 3 results) No results for input(s): PROBNP in the last 8760 hours.  CBG: No results for input(s): GLUCAP in the last 168 hours.     Signed:  Darlin Drop, MD [facility] 10/23/2019, 12:07 PM

## 2019-10-24 DIAGNOSIS — R1084 Generalized abdominal pain: Secondary | ICD-10-CM

## 2019-10-24 LAB — HEPATIC FUNCTION PANEL
ALT: 37 U/L (ref 0–44)
AST: 16 U/L (ref 15–41)
Albumin: 3.1 g/dL — ABNORMAL LOW (ref 3.5–5.0)
Alkaline Phosphatase: 82 U/L (ref 38–126)
Bilirubin, Direct: 0.2 mg/dL (ref 0.0–0.2)
Indirect Bilirubin: 0.3 mg/dL (ref 0.3–0.9)
Total Bilirubin: 0.5 mg/dL (ref 0.3–1.2)
Total Protein: 6.8 g/dL (ref 6.5–8.1)

## 2019-10-24 LAB — CULTURE, BLOOD (ROUTINE X 2)
Culture: NO GROWTH
Culture: NO GROWTH
Special Requests: ADEQUATE
Special Requests: ADEQUATE

## 2019-10-24 MED ORDER — DEXTROSE IN LACTATED RINGERS 5 % IV SOLN
INTRAVENOUS | Status: AC
  Administered 2019-10-24: 10:00:00 via INTRAVENOUS

## 2019-10-24 MED ORDER — METOPROLOL TARTRATE 5 MG/5ML IV SOLN
2.5000 mg | Freq: Once | INTRAVENOUS | Status: AC
  Administered 2019-10-24: 2.5 mg via INTRAVENOUS
  Filled 2019-10-24: qty 5

## 2019-10-24 MED ORDER — DICYCLOMINE HCL 10 MG PO CAPS
10.0000 mg | ORAL_CAPSULE | Freq: Once | ORAL | Status: AC
  Administered 2019-10-24: 10 mg via ORAL
  Filled 2019-10-24 (×2): qty 1

## 2019-10-24 MED ORDER — MORPHINE SULFATE (PF) 2 MG/ML IV SOLN
1.0000 mg | Freq: Once | INTRAVENOUS | Status: DC
  Filled 2019-10-24 (×2): qty 1

## 2019-10-24 MED ORDER — ACETAMINOPHEN 500 MG PO TABS: 1000.0000 mg | ORAL_TABLET | Freq: Four times a day (QID) | ORAL | Status: DC

## 2019-10-24 MED ORDER — ACETAMINOPHEN 650 MG RE SUPP
650.0000 mg | Freq: Four times a day (QID) | RECTAL | Status: AC
  Administered 2019-10-25: 650 mg via RECTAL
  Filled 2019-10-24: qty 1

## 2019-10-24 MED ORDER — PANTOPRAZOLE SODIUM 40 MG PO TBEC
40.0000 mg | DELAYED_RELEASE_TABLET | Freq: Every day | ORAL | Status: DC
  Filled 2019-10-24: qty 1

## 2019-10-24 MED ORDER — DICYCLOMINE HCL 10 MG PO CAPS
10.0000 mg | ORAL_CAPSULE | Freq: Three times a day (TID) | ORAL | Status: DC
  Administered 2019-10-24 – 2019-11-12 (×62): 10 mg via ORAL
  Filled 2019-10-24 (×79): qty 1

## 2019-10-24 MED ORDER — ACETAMINOPHEN 500 MG PO TABS
1000.0000 mg | ORAL_TABLET | Freq: Four times a day (QID) | ORAL | Status: AC
  Administered 2019-10-24: 1000 mg via ORAL
  Filled 2019-10-24: qty 2

## 2019-10-24 MED ORDER — ACETAMINOPHEN 500 MG PO TABS
1000.0000 mg | ORAL_TABLET | Freq: Four times a day (QID) | ORAL | Status: DC
  Administered 2019-10-24 (×3): 1000 mg via ORAL
  Filled 2019-10-24 (×3): qty 2

## 2019-10-24 NOTE — Progress Notes (Addendum)
Dr. Lennox Grumbles informed of pt's temp 102.76F and HR 129. Also request the start of telemetry for this pt. Mother educated on the benefit of using less bed covering in order to promote body temp to decrease. Mother refused d/t "pt shivering with only one top sheet".

## 2019-10-24 NOTE — Progress Notes (Signed)
Progress Note  Tracey Graham OAC:[id number] DOB: Mar 23, 1986  PCP: [facility]  Admit date: 10/13/2019 Discharge date: 10/24/2019  Time spent: 35 minutes  Recommendations for Outpatient Follow-up:  1. Follow-up with infectious disease 2. Follow-up with your PCP 3. Take your medications as prescribed 4. Fall precautions.  Discharge Diagnoses:  Active Hospital Problems   Diagnosis Date Noted   Fever of unknown origin (FUO) 10/13/2019   Lower abdominal pain    Nuchal rigidity    Aseptic meningitis    Constipation    Deep vein thrombosis (DVT) of femoral vein of left lower extremity (HCC)    Fever of unknown origin 10/14/2019   Hydrocephalus (HCC)    VP (ventriculoperitoneal) shunt status    Encephalopathy     Resolved Hospital Problems  No resolved problems to display.     Vitals:   10/24/19 0551 10/24/19 0802  BP:  117/77  Pulse:  (!) 116  Resp:  16  Temp: 99 F (37.2 C) 100.2 F (37.9 C)  SpO2:  98%    History of present illness:  Tracey Tomlinsonis a 33 y.o.femalewith medical history significant forcerebral palsy,wheelchair-bound and fully dependent with ADLs,hydrocephalus s/p VP shunt, seizure disorder, and intellectual disability who presents to the ED for evaluation of fevers and feeling unwell. -In emergency room was febrile to 101.8, white count was 11.3, rest of her electrolytes were unremarkable, urinalysis was negative, chest x-ray was unremarkable, COVID-19 PCR was negative -CT abdomen/pelvis without contrast showed trace lower abdominal pelvic free fluid per radiology may be physiologic or related to chronic VP shunt tubing. No fluid collection, abscess, or acute intra-abdominal or pelvic finding seen. -VP shunt series showed visualized VP shunt catheter intact on skull, chest, and abdominal x-rays. CT head without contrast showed a left frontal approach CSF shunt which appeared stable along with decompressed ventricles  since 2014. No acute intracranial abnormality noted. -Subsequent work-up noted left leg DVT, started anticoagulation, SQ full dose lovenox BID. -slow improvement but low grade fevers persist. LP done to r/o meningitis.  LP fluid analysis showed pleocytosis, elevated protein and lymphocytic predominance with no bacteria seen on Gram stain.  HSV PCR and enterovirus PCR pending.  On 10/19/2019 started on p.o. Decadron 4 mg every 6 hours x3 days by infectious disease and Tylenol as needed.  No NSAIDs due to anticoagulation on full dose subcu Lovenox.  10/23/19: Patient was seen and examined at her bedside this morning.  Per her mother she has abdominal pain and is bloated.  Simethicone ordered 4 times daily.  Obtained abdominal x-ray which was unrevealing.  No sign of bowel obstruction or perforation.  Lactic acid 0.9.  Per her mother she is constipated.  Last bowel movement recorded on 10/22/19 evening at 2000.  Enema ordered once. GI cocktail also ordered to relieve discomfort.  10/24/19: Patient seen and examined at her bedside with her mother present.  Abdominal pain is persistent.  Nausea without vomiting.  CT abdomen and pelvis wo contrast (10/13/19) unrevealing, abdomen x-ray (10/23/19) unrevealing as well.  LFTs, lactic acid normal.  GI consulted >> possible EGD tomorrow 10/25/2019.  Patient will be n.p.o. after midnight.   Hospital Course:  Principal Problem:   Fever of unknown origin (FUO) Active Problems:   Hydrocephalus (HCC)   VP (ventriculoperitoneal) shunt status   Encephalopathy   Fever of unknown origin   Lower abdominal pain   Nuchal rigidity   Aseptic meningitis   Constipation   Deep vein thrombosis (DVT) of femoral vein of  left lower extremity (HCC)   Suspected Lovenox induced fever Recently diagnosed with acute DVT of left lower extremity Started full dose anticoagulation subcu Lovenox on 10/14/2019 Noted fevers while on subcu full dose Lovenox. Procalcitonin less  than 0.10 on 10/14/2019 Lactic acid 1.1 on 10/13/2019 Urine analysis, chest x-ray, right upper quadrant abdominal ultrasound, all benign. LP fluid analysis showed lymphocytosis, pleocytosis with no bacteria seen on Gram stain.  HSV PCR negative and enterovirus PCR negative. VDRL negative Blood cultures x2 peripherally ordered on 10/19/2019, no growth x4 days.   Completed PO Decadron 4 mg every 6 hours x 3 days on 10/21/19. Presumed Lovenox induced fevers  Can occur in a small percentage of population. Off Lovenox since 10/21/2019, started on Eliquis Afebrile in the past 48 hours. Lactic acid 0.9 on 10/20/2019. Negative procalcitonin less than 0.10 on 10/14/2019, bacteria etiology less likely.  Presumed aseptic meningitis Cultures and HSV PCR AND ENTEROVIRUS PCR NEGATIVE Infectious disease followed Continue supportive care Completed 3 days of Decadron on 10/21/2019  Intractable abdominal pain with nausea no vomiting Abdominal pain is persistent.  Nausea without vomiting.  CT abdomen and pelvis wo contrast (10/13/19) unrevealing, abdomen x-ray (10/23/19) unrevealing as well.  LFTs, lactic acid normal.  GI consulted >> possible EGD tomorrow 10/25/2019.  Patient will be n.p.o. after midnight. Continue Bentyl Started on p.o. Protonix 40 mg daily by GI  Acute left lower extremity DVT. Continue anticoagulation with Eliquis Follow-up with PCP  Seizure disorder Continue AED Follow-up with your provider  Abdominal bloating Abdominal x-ray done on 10/23/2019 unrevealing no sign of obstruction or perforation. Lactic acid 0.9 Afebrile Physical exam benign Received simethicone GI cocktail ordered x1 dose Had a bowel movement on 10/23/19  Resolved post repletion: Hypokalemia Potassium 3.4>> 4.3 on 10/20/2019. Repleted with IV 20 mEq  Hydrocephalus s/p chronic VP shunt: VP shunt series showed visualized VP shunt catheter intact on imaging,. CT head without contrast showed a left  frontal approach CSF shunt which appeared stable along with decompressed ventricles since 2014.   Chronic constipation: Resolved post laxative She takes MiraLAX at home daily per her mother -Hold off laxatives for now until cleared by GI -Last bowel  Movement on 10/23/19. -Abdominal x-ray independently reviewed showed no sign of obstruction or perforation.  DVT prophylaxis:Eliquis Code Status:Full code, confirmed with mother   Consultants:  Infectious disease  Interventional radiology  GI on 10/24/2019.   Procedures:  Lumbar puncture by interventional radiology on 10/17/2019. Planned EGD on 10/25/2019.   Discharge Exam: BP 117/77 (BP Location: Left Arm)    Pulse (!) 116    Temp 100.2 F (37.9 C) (Oral)    Resp 16    Ht 5\' 4"  (1.626 m)    Wt 74.7 kg    LMP 10/01/2019    SpO2 98%    BMI 28.27 kg/m   General: 34 y.o. year-old female   Wd WN NAD. Somnolent but easily arousable to voice.    Cardiovascular: Regular rate and rhythm no rubs or gallops.   Respiratory: Clear to auscultation no wheezes or rales.    Abdomen: Mildly distended.  Bowel sounds present.  Right upper and right lower quadrant tenderness on palpation.   Musculoskeletal: Trace lower extremity edema.    Psychiatry: Mood is appropriate for condition and setting.  Discharge Instructions You were cared for by a hospitalist during your hospital stay. If you have any questions about your discharge medications or the care you received while you were in the hospital after you are  discharged, you can call the unit and asked to speak with the hospitalist on call if the hospitalist that took care of you is not available. Once you are discharged, your primary care physician will handle any further medical issues. Please note that NO REFILLS for any discharge medications will be authorized once you are discharged, as it is imperative that you return to your primary care physician (or establish a relationship with  a primary care physician if you do not have one) for your aftercare needs so that they can reassess your need for medications and monitor your lab values.   Allergies as of 10/24/2019      Reactions   Penicillins Swelling   Did not have reaction to 5d of amoxicillin Has patient had a PCN reaction causing immediate rash, facial/tongue/throat swelling, SOB or lightheadedness with hypotension: Yes Has patient had a PCN reaction causing severe rash involving mucus membranes or skin necrosis: No Has patient had a PCN reaction that required hospitalization No Has patient had a PCN reaction occurring within the last 10 years: No If all of the above answers are "NO", then may proceed with Cephalosporin use.      Medication List    STOP taking these medications   cetirizine 1 MG/ML syrup Commonly known as: ZYRTEC   erythromycin ophthalmic ointment   guaiFENesin 100 MG/5ML liquid Commonly known as: ROBITUSSIN   ibuprofen 100 MG/5ML suspension Commonly known as: ADVIL   sodium chloride 0.65 % Soln nasal spray Commonly known as: OCEAN     TAKE these medications   acetaminophen 160 MG/5ML liquid Commonly known as: TYLENOL Take 500 mg by mouth every 4 (four) hours as needed for fever.   apixaban 5 MG Tabs tablet Commonly known as: Eliquis Take 2 tablets (10mg ) twice daily for 7 days, then 1 tablet (5mg ) twice daily   diphenhydrAMINE 12.5 MG/5ML syrup Commonly known as: BENYLIN Take 10 mLs (25 mg total) by mouth every 4 (four) hours as needed for allergies.   docusate 50 MG/5ML liquid Commonly known as: COLACE Take 10 mLs (100 mg total) by mouth daily as needed for mild constipation or moderate constipation.   fluticasone 50 MCG/ACT nasal spray Commonly known as: FLONASE Place 2 sprays into both nostrils daily.   PHENobarbital 97.2 MG tablet Commonly known as: LUMINAL Take 97.2 mg by mouth at bedtime.   polyethylene glycol 17 g packet Commonly known as: MiraLax Take 17 g  by mouth daily.      Allergies  Allergen Reactions   Penicillins Swelling    Did not have reaction to 5d of amoxicillin Has patient had a PCN reaction causing immediate rash, facial/tongue/throat swelling, SOB or lightheadedness with hypotension: Yes Has patient had a PCN reaction causing severe rash involving mucus membranes or skin necrosis: No Has patient had a PCN reaction that required hospitalization No Has patient had a PCN reaction occurring within the last 10 years: No If all of the above answers are "NO", then may proceed with Cephalosporin use.    Follow-up Information    [facility]. Call in 1 day(s).   Specialty: Family Medicine Why: please all for a post hospital follow up appointment. Contact information: [address] [place] [address] [phone]        Tracey Graham, Cynthia, MD. Call in 1 day(s).   Specialty: Infectious Diseases Why: please call for a post hospital follow up appointment. Contact information: [address] [place] [address] [phone]  The results of significant diagnostics from this hospitalization (including imaging, microbiology, ancillary and laboratory) are listed below for reference.    Significant Diagnostic Studies: Ct Abdomen Pelvis Wo Contrast  Result Date: 10/13/2019 CLINICAL DATA:  Generalized abdominal pain with nausea and fever EXAM: CT ABDOMEN AND PELVIS WITHOUT CONTRAST TECHNIQUE: Multidetector CT imaging of the abdomen and pelvis was performed following the standard protocol without IV contrast. COMPARISON:  01/06/2013 FINDINGS: Lower chest: No acute abnormality. Hepatobiliary: Limited without IV contrast. No large focal hepatic abnormality or intrahepatic biliary dilatation. Gallbladder and biliary system unremarkable. Common bile duct nondilated. Pancreas: Unremarkable. No pancreatic ductal dilatation or surrounding inflammatory changes. Spleen: Normal in size without  focal abnormality. Adrenals/Urinary Tract: Adrenal glands are unremarkable. Kidneys are normal, without renal calculi, focal lesion, or hydronephrosis. Bladder is unremarkable. Stomach/Bowel: Negative for bowel obstruction, significant dilatation, ileus, or free air. Appendix is unremarkable and retrocecal in position. No acute inflammatory process, fluid collection, abscess, hemorrhage or hematoma Vascular/Lymphatic: Limited without IV contrast. No aneurysm. No adenopathy. Reproductive: Uterus and adnexa normal in size. Trace pelvic free fluid may be physiologic or related to the chronic VP shunt tubing. Other: No inguinal or abdominal wall hernia. Musculoskeletal: Degenerative changes of the spine. IMPRESSION: No acute intra-abdominal or pelvic finding by noncontrast CT. Trace lower abdominopelvic free fluid may be physiologic or related to the chronic VP shunt tubing. No fluid collection or abscess. Electronically Signed   By: Jerilynn Mages.  Shick M.D.   On: 10/13/2019 12:40   Dg Skull 1-3 Views  Result Date: 10/13/2019 CLINICAL DATA:  33 year old female with fever and right-sided abdominal pain since yesterday. Evaluate for shunt malfunction. EXAM: SKULL - 1-3 VIEW COMPARISON:  No priors. FINDINGS: Left-sided ventriculoperitoneal shunt catheter noted, visualized portions of which are intact. IMPRESSION: 1. Visualized ventriculoperitoneal shunt catheter is intact. Electronically Signed   By: Vinnie Langton M.D.   On: 10/13/2019 18:30   Dg Chest 1 View  Result Date: 10/13/2019 CLINICAL DATA:  33 year old female with history of right-sided abdominal pain since yesterday. Evaluate for potential shunt malfunction. EXAM: CHEST  1 VIEW COMPARISON:  Chest x-ray 10/13/2019. FINDINGS: The heart size and mediastinal contours are within normal limits. Both lungs are clear. The visualized skeletal structures are unremarkable. Surgical clips in the left upper mediastinum. Ventriculoperitoneal shunt catheter projecting over  the left hemithorax and upper left abdomen, which appears intact without obvious shunt discontinuities or kinks. IMPRESSION: 1. Ventriculoperitoneal shunt catheter as visualized appears intact. 2. No radiographic evidence of acute cardiopulmonary disease. Electronically Signed   By: Vinnie Langton M.D.   On: 10/13/2019 18:22   Dg Chest 2 View  Result Date: 10/14/2019 CLINICAL DATA:  Initial evaluation for fever of unknown origin. EXAM: CHEST - 2 VIEW COMPARISON:  Prior radiograph from 10/13/2019. FINDINGS: Cardiac and mediastinal silhouettes are stable, and remain within normal limits. Surgical clip overlies the upper left mediastinum. Lungs normally inflated. No focal infiltrates. No edema or effusion. No pneumothorax. VP shunt catheter overlies the left chest, stable. Osseous structures are unchanged. IMPRESSION: Stable appearance of the chest, with no active cardiopulmonary disease identified. Electronically Signed   By: Jeannine Boga M.D.   On: 10/14/2019 07:28   Dg Chest 2 View  Result Date: 10/13/2019 CLINICAL DATA:  Fever and right-sided abdominal pain since yesterday. EXAM: CHEST - 2 VIEW COMPARISON:  01/09/2016. 06/25/2015. FINDINGS: Heart size is normal. Ductus clip present within the mediastinum. The lungs are clear. The vascularity is normal. No effusions. VP shunt overlies the left chest.  IMPRESSION: No active disease. Electronically Signed   By: Paulina Fusi M.D.   On: 10/13/2019 09:38   Dg Abd 1 View  Result Date: 10/13/2019 CLINICAL DATA:  33 year old female.  Evaluate for shunt malfunction. EXAM: ABDOMEN - 1 VIEW COMPARISON:  Abdominal radiograph 01/09/2016. FINDINGS: Ventriculoperitoneal shunt catheter seen projecting over the left side of the abdomen, coiled into the mid lower abdomen and upper pelvis. Visualized portions of the catheter appear intact, although there appears to be extra catheter fragments in the low abdomen and upper pelvis. Gas and stool is noted  throughout the colon extending to the level of the distal rectum. No definite pathologic dilatation of small bowel. IMPRESSION: 1. Ventriculoperitoneal shunt catheter appears intact. 2. Residual catheter fragment in the lower abdomen, similar to prior examinations. 3. Nonobstructive bowel gas pattern. Electronically Signed   By: Trudie Reed M.D.   On: 10/13/2019 18:28   Ct Head Wo Contrast  Result Date: 10/13/2019 CLINICAL DATA:  33 year old female with unexplained altered mental status. EXAM: CT HEAD WITHOUT CONTRAST TECHNIQUE: Contiguous axial images were obtained from the base of the skull through the vertex without intravenous contrast. COMPARISON:  [facility] head CT 01/05/2013. FINDINGS: Brain: Scaphocephaly. Dysgenesis of the corpus callosum. Mildly dysplastic appearing ventricles which remain decompressed since 2014 in the setting of a left superior frontal approach CSF shunt. Parietal and occipital lobe chronic volume loss. Relatively normal appearing posterior fossa. No midline shift, ventriculomegaly, mass effect, evidence of mass lesion, intracranial hemorrhage or evidence of cortically based acute infarction. Vascular: No suspicious intracranial vascular hyperdensity. Skull: Chronic superior frontal burr holes. No acute osseous abnormality identified. Sinuses/Orbits: Visualized paranasal sinuses and mastoids are stable and well pneumatized. Other: Left convexity CSF reservoir with shunt tubing continuing caudally in the left neck. Partial calcification of the catheter beginning in the left suboccipital region, but no adverse features. Chronic postoperative changes to the contralateral right scalp. Rightward gaze deviation. IMPRESSION: 1. No acute intracranial abnormality. 2. Left frontal approach CSF shunt appears stable along with decompressed ventricles since 2014. 3. Underlying dysgenesis of the corpus callosum, scaphocephaly, chronic parieto-occipital cerebral volume loss.  Electronically Signed   By: Odessa Fleming M.D.   On: 10/13/2019 18:40   Dg Chest Port 1 View  Result Date: 10/23/2019 CLINICAL DATA:  Chest pain. EXAM: PORTABLE CHEST 1 VIEW COMPARISON:  October 14, 2019. FINDINGS: The heart size and mediastinal contours are within normal limits. No pneumothorax or pleural effusion is noted. Right lung is clear. Mild left basilar atelectasis is noted. The visualized skeletal structures are unremarkable. Left-sided ventriculoperitoneal shunt is again noted with associated calcification. IMPRESSION: Mild left basilar subsegmental atelectasis. Left-sided ventriculoperitoneal shunt is noted with associated calcification. Electronically Signed   By: Lupita Raider M.D.   On: 10/23/2019 08:20   Dg Abd Portable 1v  Result Date: 10/23/2019 CLINICAL DATA:  Epigastric abdominal pain. EXAM: PORTABLE ABDOMEN - 1 VIEW COMPARISON:  October 13, 2019. FINDINGS: The bowel gas pattern is normal. No free air is noted. Distal tip of ventriculoperitoneal shunt is seen in the central portion of the abdomen. No abnormal calcifications are noted. IMPRESSION: Distal tip of left-sided ventriculoperitoneal shunt is seen in central portion of the abdomen. There is no evidence of bowel obstruction or ileus. Electronically Signed   By: Lupita Raider M.D.   On: 10/23/2019 08:22   Vas [place] Lower Extremity Venous (dvt)  Result Date: 10/14/2019  Lower Venous Study Indications: Fever, elevated D-Dimer.  Risk Factors: Cerebal Palsy,  wheelchair bound, abdominal pain. Limitations: Body habitus and edema, depth of vessels. Comparison Study: No prior study on file Performing Technologist: Sherren Kerns RVS  Examination Guidelines: A complete evaluation includes B-mode imaging, spectral Doppler, color Doppler, and power Doppler as needed of all accessible portions of each vessel. Bilateral testing is considered an integral part of a complete examination. Limited examinations for reoccurring indications may be  performed as noted.  +---------+---------------+---------+-----------+----------+-------------------+  RIGHT     Compressibility Phasicity Spontaneity Properties Thrombus Aging       +---------+---------------+---------+-----------+----------+-------------------+  CFV       Full            Yes       Yes                                         +---------+---------------+---------+-----------+----------+-------------------+  SFJ       Full                                                                  +---------+---------------+---------+-----------+----------+-------------------+  FV Prox   Full                                                                  +---------+---------------+---------+-----------+----------+-------------------+  FV Mid    Full                                                                  +---------+---------------+---------+-----------+----------+-------------------+  FV Distal Full                                                                  +---------+---------------+---------+-----------+----------+-------------------+  PFV       Full                                                                  +---------+---------------+---------+-----------+----------+-------------------+  POP                                                        Patent by color and  Doppler              +---------+---------------+---------+-----------+----------+-------------------+  PERO                                                       Not visualized       +---------+---------------+---------+-----------+----------+-------------------+   +---------+---------------+---------+-----------+----------+-------------------+  LEFT      Compressibility Phasicity Spontaneity Properties Thrombus Aging       +---------+---------------+---------+-----------+----------+-------------------+  CFV       Full            Yes       Yes                                          +---------+---------------+---------+-----------+----------+-------------------+  SFJ       Full                                                                  +---------+---------------+---------+-----------+----------+-------------------+  FV Prox   Full                                                                  +---------+---------------+---------+-----------+----------+-------------------+  FV Mid    Full                                                                  +---------+---------------+---------+-----------+----------+-------------------+  FV Distal Full                                                                  +---------+---------------+---------+-----------+----------+-------------------+  PFV       Full                                                                  +---------+---------------+---------+-----------+----------+-------------------+  POP                                                        Patent by color and  Dopple               +---------+---------------+---------+-----------+----------+-------------------+  PTV       None                                             Acute                +---------+---------------+---------+-----------+----------+-------------------+  PERO      None                                             Acute                +---------+---------------+---------+-----------+----------+-------------------+     Summary: Right: There is no evidence of deep vein thrombosis in the lower extremity. However, portions of this examination were limited- see technologist comments above. Left: Findings consistent with acute deep vein thrombosis involving the left posterior tibial veins, and left peroneal veins.  *See table(s) above for measurements and observations. Electronically signed by Coral Else MD on 10/14/2019 at 4:36:11 PM.    Final    Dg Fl Guided Lumbar  Puncture  Result Date: 10/17/2019 CLINICAL DATA:  Fever. EXAM: DIAGNOSTIC LUMBAR PUNCTURE UNDER FLUOROSCOPIC GUIDANCE FLUOROSCOPY TIME:  Fluoroscopy Time:  18 seconds Radiation Exposure Index (if provided by the fluoroscopic device): 1.8 mGy Number of Acquired Spot Images: 0 PROCEDURE: Informed consent was obtained from the patient prior to the procedure, including potential complications of headache, allergy, and pain. With the patient prone, the lower back was prepped with Betadine. 1% Lidocaine was used for local anesthesia. Lumbar puncture was performed at the L4-L5 level using a 21 gauge needle with return of clear CSF with grossly normal opening pressure to slightly diminished, CSF flowed slowly mild blood-tinged was noted on later samples perhaps from adjacent venous plexus, 7 ml of CSF were obtained for laboratory studies. The patient tolerated the procedure well and there were no apparent complications. IMPRESSION: Technically successful L4-L5 lumbar puncture without signs of complication. Electronically Signed   By: Donzetta Kohut M.D.   On: 10/17/2019 10:19   US Abdomen Limited Ruq  Result Date: 10/14/2019 CLINICAL DATA:  Abdominal pain x2 days EXAM: ULTRASOUND ABDOMEN LIMITED RIGHT UPPER QUADRANT COMPARISON:  None. FINDINGS: Gallbladder: No gallstones, gallbladder wall thickening, or pericholecystic fluid. Negative sonographic Murphy's sign. Common bile duct: Diameter: 4 mm Liver: Poorly visualized. No focal lesion identified. Within normal limits in parenchymal echogenicity. Portal vein is patent on color Doppler imaging with normal direction of blood flow towards the liver. Other: None. IMPRESSION: Negative right upper quadrant ultrasound. Electronically Signed   By: Charline Bills M.D.   On: 10/14/2019 12:47    Microbiology: Recent Results (from the past 240 hour(s))  Anaerobic culture     Status: None   Collection Time: 10/17/19 10:05 AM   Specimen: PATH Cytology CSF; Cerebrospinal  Fluid  Result Value Ref Range Status   Specimen Description CSF  Final   Special Requests NONE  Final   Culture   Final    NO ANAEROBES ISOLATED Performed at [facility], [address], [place], [place] [address]    Report Status 10/22/2019 FINAL  Final  CSF culture     Status: None   Collection Time:  10/17/19 10:05 AM   Specimen: PATH Cytology CSF; Cerebrospinal Fluid  Result Value Ref Range Status   Specimen Description CSF  Final   Special Requests NONE  Final   Gram Stain   Final    WBC PRESENT,BOTH PMN AND MONONUCLEAR NO ORGANISMS SEEN CYTOSPIN SMEAR    Culture   Final    NO GROWTH 3 DAYS Performed at [facility], [address] [place], [place] [address]    Report Status 10/20/2019 FINAL  Final  Culture, blood (routine x 2)     Status: None   Collection Time: 10/19/19 10:12 AM   Specimen: BLOOD LEFT HAND  Result Value Ref Range Status   Specimen Description BLOOD LEFT HAND  Final   Special Requests   Final    BOTTLES DRAWN AEROBIC ONLY Blood Culture adequate volume   Culture   Final    NO GROWTH 5 DAYS Performed at [facility], [address], [place], [place] [address]    Report Status 10/24/2019 FINAL  Final  Culture, blood (routine x 2)     Status: None   Collection Time: 10/19/19 10:24 AM   Specimen: BLOOD LEFT HAND  Result Value Ref Range Status   Specimen Description BLOOD LEFT HAND  Final   Special Requests   Final    BOTTLES DRAWN AEROBIC ONLY Blood Culture adequate volume   Culture   Final    NO GROWTH 5 DAYS Performed at [facility], [address] [place], [place] [address]    Report Status 10/24/2019 FINAL  Final     Labs: Basic Metabolic Panel: Recent Labs  Lab 10/18/19 0808 10/20/19 0650  NA 139 135  K 4.4 4.3  CL 102 102  CO2 27 26  GLUCOSE 125* 126*  BUN 11 8  CREATININE 0.50 0.56  CALCIUM 8.8* 8.4*   Liver Function Tests: Recent Labs  Lab 10/24/19 0341  AST 16  ALT 37  ALKPHOS 82  BILITOT  0.5  PROT 6.8  ALBUMIN 3.1*   No results for input(s): LIPASE, AMYLASE in the last 168 hours. No results for input(s): AMMONIA in the last 168 hours. CBC: Recent Labs  Lab 10/18/19 0808 10/20/19 0650 10/22/19 1331  WBC 8.7 11.5* 13.9*  NEUTROABS  --  9.8*  --   HGB 14.5 12.9 13.7  HCT 41.1 35.8* 37.9  MCV 80.4 79.2* 79.3*  PLT 233 240 264   Cardiac Enzymes: No results for input(s): CKTOTAL, CKMB, CKMBINDEX, TROPONINI in the last 168 hours. BNP: BNP (last 3 results) No results for input(s): BNP in the last 8760 hours.  ProBNP (last 3 results) No results for input(s): PROBNP in the last 8760 hours.  CBG: No results for input(s): GLUCAP in the last 168 hours.     Signed:  Darlin Drop, MD [facility] 10/24/2019, 3:01 PM

## 2019-10-24 NOTE — Consult Note (Addendum)
Gastroenterology Consult: 11:16 AM 10/24/2019  LOS: 10 days    Referring Provider: Dr. Raphael Gibney Primary Care Physician: Virl Son MD of [facility] at [place]. Primary Gastroenterologist: unassigned    Reason for Consultation: Abdominal pain of unclear etiology   HPI: Tracey Graham is a 33 y.o. female.  Patient with cerebral palsy, mother communicates for the patient.  Wheelchair-bound, fully dependent for all ADLs.  S/p VP shunt 2014 to address hydrocephalus.  Also pull shunt revisions.  Seizures.  Intellectual disability.  Legally blind.  Speech disorder.  Chronic constipation.  Admitted 10/13/2019 fever, new DVT.  She had about 24 to 36 hours of abdominal pain as the reason for her coming to the hospital. Unremarkable VP shunt studies as well as nothing acute on head CT.  Lumbar puncture negative but was treated for 3 days with Decadron for possible aseptic meningitis after consultation with infectious disease. Now on Eliquis.  Patient has had issues with constipation for years.  She has bowel movements about every 2 or 3 days.  Mother gives her Senokot and MiraLAX every couple of days.  She does not use these daily because otherwise the patient will get diarrhea.  Sometimes patient would get some abdominal discomfort but it was not severe or lingering.  Never had bloody or black stools.  Would eat well.  Abdominal pain would get better with bowel movements.  The patient's abdominal pain persists.  It is worse in the left lower quadrant but there is diffuse as well.   Not eating much because pain gets worse after eating.  Does not complain of nausea and has not vomited. 10/13/2019 CTAP wo contrast.  Trace abdominal pelvic free fluid may be physiologic or due to the VP shunt.   Hepatobiliary system not well seen without contrast but there was no large liver abnormality, gallbladder, biliary system unremarkable.  10/14/2019 abdominal ultrasound: Unremarkable gallbladder.  CBD 4 mm.  Poorly visualized liver but nothing focal noted.  Patent portal vein with normal directional flow. 10/23/2019 KUB shows presence of VP shunt in mid abdomen.  No bowel obstruction or ileus.  No significant amount of retained stool. She is received rectal suppository and had a small amount of soft brown stool as a result.  Had a small amount of soft stool as a result of an enema. LFTs consistently normal.  Lipase not assayed. MCV low at 79, no anemia. UA negative.   06/26/19 72.6 kg (160 lb)  08/25/18 72.6 kg (160 lb 0.9 oz)  08/10/18 72.6 kg (160 lb)       Past Medical History:  Diagnosis Date  . Hydrocephalus (HCC)   . Seizures (HCC)   . VP (ventriculoperitoneal) shunt status     Past Surgical History:  Procedure Laterality Date  . CARDIAC SURGERY    . VENTRICULOPERITONEAL SHUNT      Prior to Admission medications   Medication Sig Start Date End Date Taking? Authorizing Provider  acetaminophen (TYLENOL) 160 MG/5ML liquid Take 500 mg by mouth every 4 (four) hours  as needed for fever.   Yes [provider]  diphenhydrAMINE (BENYLIN) 12.5 MG/5ML syrup Take 10 mLs (25 mg total) by mouth every 4 (four) hours as needed for allergies. 10/19/16  Yes Lyndal PulleyKnott, Daniel, MD  docusate (COLACE) 50 MG/5ML liquid Take 10 mLs (100 mg total) by mouth daily as needed for mild constipation or moderate constipation. 01/09/16  Yes Lavera GuiseLiu, Dana Duo, MD  fluticasone [facility](FLONASE) 50 MCG/ACT nasal spray Place 2 sprays into both nostrils daily. 06/25/15  Yes Kirichenko, Tatyana, PA-C  PHENobarbital (LUMINAL) 97.2 MG tablet Take 97.2 mg by mouth at bedtime.    Yes [provider]  polyethylene glycol (MIRALAX) packet Take 17 g by mouth daily. 01/06/13  Yes Schinlever, Santina Evansatherine, PA-C  apixaban  (ELIQUIS) 5 MG TABS tablet Take 2 tablets (10mg ) twice daily for 7 days, then 1 tablet (5mg ) twice daily 10/23/19   Darlin DropHall, Carole N, DO  cetirizine (ZYRTEC) 1 MG/ML syrup Take 10 mLs (10 mg total) by mouth daily. Patient not taking: Reported on 10/13/2019 10/19/16   Lyndal PulleyKnott, Daniel, MD  erythromycin ophthalmic ointment Place a 1/2 inch ribbon of ointment into the lower eyelid. Patient not taking: Reported on 10/13/2019 10/19/16   Lyndal PulleyKnott, Daniel, MD  guaiFENesin (ROBITUSSIN) 100 MG/5ML liquid Take 5-10 mLs (100-200 mg total) by mouth every 4 (four) hours as needed for cough. Patient not taking: Reported on 10/13/2019 06/25/15   Jaynie CrumbleKirichenko, Tatyana, PA-C  ibuprofen (ADVIL,MOTRIN) 100 MG/5ML suspension Take 20 mLs (400 mg total) by mouth every 6 (six) hours as needed. Patient not taking: Reported on 10/13/2019 10/19/16   Lyndal PulleyKnott, Daniel, MD  sodium chloride (OCEAN) 0.65 % SOLN nasal spray Place 1 spray into both nostrils as needed for congestion. Patient not taking: Reported on 01/09/2016 06/25/15   Jaynie CrumbleKirichenko, Tatyana, PA-C    Scheduled Meds: . acetaminophen  1,000 mg Oral Q6H  . [START ON 10/29/2019] apixaban  5 mg Oral BID   Followed by  . apixaban  10 mg Oral BID  . dicyclomine  20 mg Oral TID AC & HS  .  morphine injection  1 mg Intravenous Once  . phenobarbital  97.2 mg Oral QHS   Infusions: . dextrose 5% lactated ringers 75 mL/hr at 10/24/19 1019   PRN Meds: acetaminophen, bisacodyl, diphenhydrAMINE, morphine injection, ondansetron **OR** ondansetron (ZOFRAN) IV, polyethylene glycol, sodium chloride flush   Allergies as of 10/13/2019 - Review Complete 10/13/2019  Allergen Reaction Noted  . Penicillins Swelling 01/05/2013    Family History  Problem Relation Age of Onset  . Hypertension Mother   . Hypertension Father   . Diabetes Father     Social History   Socioeconomic History  . Marital status: Single    Spouse name: Not on file  . Number of children: Not on file  . Years of  education: Not on file  . Highest education level: Not on file  Occupational History  . Not on file  Social Needs  . Financial resource strain: Not on file  . Food insecurity    Worry: Not on file    Inability: Not on file  . Transportation needs    Medical: Not on file    Non-medical: Not on file  Tobacco Use  . Smoking status: Never Smoker  . Smokeless tobacco: Never Used  Substance and Sexual Activity  . Alcohol use: No  . Drug use: No  . Sexual activity: Never  Lifestyle  . Physical activity    Days per week: Not on file  Minutes per session: Not on file  . Stress: Not on file  Relationships  . Social Musician on phone: Not on file    Gets together: Not on file    Attends religious service: Not on file    Active member of club or organization: Not on file    Attends meetings of clubs or organizations: Not on file    Relationship status: Not on file  . Intimate partner violence    Fear of current or ex partner: Not on file    Emotionally abused: Not on file    Physically abused: Not on file    Forced sexual activity: Not on file  Other Topics Concern  . Not on file  Social History Narrative  . Not on file    REVIEW OF SYSTEMS: Constitutional: See HPI.  She is wheelchair-bound at baseline and total care at baseline. ENT:  No nose bleeds.  Lifelong snoring.  Progressed over recent years. Pulm: No shortness of breath, no cough. CV:  No palpitations, no LE edema.  No chest pain, no palpitations. GU:  No hematuria, no frequency GI: Dysphagia.  If she eats too fast she coughs, gags but mother controls this by feeding her well caught up food.. Heme: No excessive or unusual bleeding. Transfusions: None Neuro:  No headaches, no peripheral tingling or numbness.  No recent seizures. Derm:  No itching, no rash or sores.  Endocrine:  No sweats or chills.  No polyuria or dysuria Immunization: Not queried. Travel:  None beyond local counties in last few  months.    PHYSICAL EXAM: Vital signs in last 24 hours: Vitals:   10/24/19 0551 10/24/19 0802  BP:  117/77  Pulse:  (!) 116  Resp:  16  Temp: 99 F (37.2 C) 100.2 F (37.9 C)  SpO2:  98%   Wt Readings from Last 3 Encounters:  10/14/19 74.7 kg  01/09/16 64.9 kg  11/18/15 68 kg    General: Developmentally delayed.  Laying in bed seems comfortable.  Not able to answer questions Head: No facial asymmetry or swelling.  No signs of head trauma. Eyes: No scleral icterus.  No conjunctival pallor. Ears: No obvious hearing deficit. Nose: No discharge Mouth: Limited views but did open her mouth a little bit.  Fair dentition.  Tongue midline.  Mucosa pink, moist, clear. Neck: No JVD, no masses, no thyromegaly. Lungs: Clear bilaterally.  No labored breathing or cough. Heart: RRR.  No MRG.  S1, S2 present. Abdomen: Soft.  Diffusely tender without guarding or rebound.  Slightly distended and minimally tense.  Active bowel sounds.  No fluid wave/succession splash..   Rectal: Deferred Musc/Skeltl: Some skeletal deformity consistent with CP, short feet. Extremities: No CCE.  Feet are warm. Neurologic: Only able to grunt for me.  Mother was able to get her to open her mouth.  Moves all 4 limbs spontaneously. Skin: No rash, no sores Nodes: No cervical adenopathy   Intake/Output from previous day: 10/27 0701 - 10/28 0700 In: -  Out: 700 [Urine:700] Intake/Output this shift: No intake/output data recorded.  LAB RESULTS: Recent Labs    10/22/19 1331  WBC 13.9*  HGB 13.7  HCT 37.9  PLT 264   BMET Lab Results  Component Value Date   NA 135 10/20/2019   NA 139 10/18/2019   NA 138 10/17/2019   K 4.3 10/20/2019   K 4.4 10/18/2019   K 3.4 (L) 10/17/2019   CL 102 10/20/2019  CL 102 10/18/2019   CL 100 10/17/2019   CO2 26 10/20/2019   CO2 27 10/18/2019   CO2 26 10/17/2019   GLUCOSE 126 (H) 10/20/2019   GLUCOSE 125 (H) 10/18/2019   GLUCOSE 87 10/17/2019   BUN 8 10/20/2019    BUN 11 10/18/2019   BUN 8 10/17/2019   CREATININE 0.56 10/20/2019   CREATININE 0.50 10/18/2019   CREATININE 0.53 10/17/2019   CALCIUM 8.4 (L) 10/20/2019   CALCIUM 8.8 (L) 10/18/2019   CALCIUM 8.5 (L) 10/17/2019   LFT Recent Labs    10/24/19 0341  PROT 6.8  ALBUMIN 3.1*  AST 16  ALT 37  ALKPHOS 82  BILITOT 0.5  BILIDIR 0.2  IBILI 0.3   PT/INR Lab Results  Component Value Date   INR 1.1 10/13/2019   Hepatitis Panel No results for input(s): HEPBSAG, HCVAB, HEPAIGM, HEPBIGM in the last 72 hours. C-Diff No components found for: CDIFF Lipase     Component Value Date/Time   LIPASE 16 01/09/2016 0905    Drugs of Abuse  No results found for: LABOPIA, COCAINSCRNUR, LABBENZ, AMPHETMU, THCU, LABBARB   RADIOLOGY STUDIES: Dg Chest Port 1 View  Result Date: 10/23/2019 CLINICAL DATA:  Chest pain. EXAM: PORTABLE CHEST 1 VIEW COMPARISON:  October 14, 2019. FINDINGS: The heart size and mediastinal contours are within normal limits. No pneumothorax or pleural effusion is noted. Right lung is clear. Mild left basilar atelectasis is noted. The visualized skeletal structures are unremarkable. Left-sided ventriculoperitoneal shunt is again noted with associated calcification. IMPRESSION: Mild left basilar subsegmental atelectasis. Left-sided ventriculoperitoneal shunt is noted with associated calcification. Electronically Signed   By: Marijo Conception M.D.   On: 10/23/2019 08:20   Dg Abd Portable 1v  Result Date: 10/23/2019 CLINICAL DATA:  Epigastric abdominal pain. EXAM: PORTABLE ABDOMEN - 1 VIEW COMPARISON:  October 13, 2019. FINDINGS: The bowel gas pattern is normal. No free air is noted. Distal tip of ventriculoperitoneal shunt is seen in the central portion of the abdomen. No abnormal calcifications are noted. IMPRESSION: Distal tip of left-sided ventriculoperitoneal shunt is seen in central portion of the abdomen. There is no evidence of bowel obstruction or ileus. Electronically Signed    By: Marijo Conception M.D.   On: 10/23/2019 08:22     IMPRESSION:   *     Abdominal pain of unclear etiology. Overall the symptoms sound intestinal.  But cannot rule out upper GI tract disease.  No PPI etc. at home or currently. Chronic constipation but her current symptoms are different from baseline.  *     Fever.  Question aseptic meningitis.  *      New lower extremity DVT on Eliquis.  See if Eliquis this morning.    PLAN:     *    EGD.  Investigate on the off chance this is ulcer disease. Timing tomorrow?, diagnostic only since she had Eliquis today versus hold Eliquis for 1 day and pursue EGD on Friday?  Colonoscopy would be challenging and require NG tube placement to give bowel prep.  *    Starting empiric Protonix 40 mg/day.  After discussing medications with the patient's mother, okay to resume Bentyl.   Azucena Freed  10/24/2019, 11:16 AM Phone [phone]

## 2019-10-24 NOTE — Plan of Care (Signed)

## 2019-10-24 NOTE — Progress Notes (Signed)
UPDATE: Went to see the patient at her bedside this PM. Per patient's mother her abd pain is worse. Upon entering the room, the patient is resting and in no acute distress. Occasionally would have quick spontaneous jerk-like movements with grimace and would fall back asleep. Prior to coming to the room, the writer paged GI Mayra Reel since was told her abd pain was worse.

## 2019-10-24 NOTE — H&P (View-Only) (Signed)
Gastroenterology Consult: 11:16 AM 10/24/2019  LOS: 10 days    Referring Provider: Dr. Raphael Gibney Primary Care Physician: Virl Son MD of [facility] at [place] [facility]. Primary Gastroenterologist: unassigned    Reason for Consultation: Abdominal pain of unclear etiology   HPI: Tracey Graham is a 33 y.o. female.  Patient with cerebral palsy, mother communicates for the patient.  Wheelchair-bound, fully dependent for all ADLs.  S/p VP shunt 2014 to address hydrocephalus.  Also pull shunt revisions.  Seizures.  Intellectual disability.  Legally blind.  Speech disorder.  Chronic constipation.  Admitted 10/13/2019 fever, new DVT.  She had about 24 to 36 hours of abdominal pain as the reason for her coming to the hospital. Unremarkable VP shunt studies as well as nothing acute on head CT.  Lumbar puncture negative but was treated for 3 days with Decadron for possible aseptic meningitis after consultation with infectious disease. Now on Eliquis.  Patient has had issues with constipation for years.  She has bowel movements about every 2 or 3 days.  Mother gives her Senokot and MiraLAX every couple of days.  She does not use these daily because otherwise the patient will get diarrhea.  Sometimes patient would get some abdominal discomfort but it was not severe or lingering.  Never had bloody or black stools.  Would eat well.  Abdominal pain would get better with bowel movements.  The patient's abdominal pain persists.  It is worse in the left lower quadrant but there is diffuse as well.   Not eating much because pain gets worse after eating.  Does not complain of nausea and has not vomited. 10/13/2019 CTAP wo contrast.  Trace abdominal pelvic free fluid may be physiologic or due to the VP shunt.   Hepatobiliary system not well seen without contrast but there was no large liver abnormality, gallbladder, biliary system unremarkable.  10/14/2019 abdominal ultrasound: Unremarkable gallbladder.  CBD 4 mm.  Poorly visualized liver but nothing focal noted.  Patent portal vein with normal directional flow. 10/23/2019 KUB shows presence of VP shunt in mid abdomen.  No bowel obstruction or ileus.  No significant amount of retained stool. She is received rectal suppository and had a small amount of soft brown stool as a result.  Had a small amount of soft stool as a result of an enema. LFTs consistently normal.  Lipase not assayed. MCV low at 79, no anemia. UA negative.   06/26/19 72.6 kg (160 lb)  08/25/18 72.6 kg (160 lb 0.9 oz)  08/10/18 72.6 kg (160 lb)       Past Medical History:  Diagnosis Date  . Hydrocephalus (HCC)   . Seizures (HCC)   . VP (ventriculoperitoneal) shunt status     Past Surgical History:  Procedure Laterality Date  . CARDIAC SURGERY    . VENTRICULOPERITONEAL SHUNT      Prior to Admission medications   Medication Sig Start Date End Date Taking? Authorizing Provider  acetaminophen (TYLENOL) 160 MG/5ML liquid Take 500 mg by mouth every 4 (four) hours  as needed for fever.   Yes [provider]  diphenhydrAMINE (BENYLIN) 12.5 MG/5ML syrup Take 10 mLs (25 mg total) by mouth every 4 (four) hours as needed for allergies. 10/19/16  Yes Lyndal PulleyKnott, Daniel, MD  docusate (COLACE) 50 MG/5ML liquid Take 10 mLs (100 mg total) by mouth daily as needed for mild constipation or moderate constipation. 01/09/16  Yes Lavera GuiseLiu, Dana Duo, MD  fluticasone [facility](FLONASE) 50 MCG/ACT nasal spray Place 2 sprays into both nostrils daily. 06/25/15  Yes Kirichenko, Tatyana, PA-C  PHENobarbital (LUMINAL) 97.2 MG tablet Take 97.2 mg by mouth at bedtime.    Yes [provider]  polyethylene glycol (MIRALAX) packet Take 17 g by mouth daily. 01/06/13  Yes Schinlever, Santina Evansatherine, PA-C  apixaban  (ELIQUIS) 5 MG TABS tablet Take 2 tablets (10mg ) twice daily for 7 days, then 1 tablet (5mg ) twice daily 10/23/19   Darlin DropHall, Carole N, DO  cetirizine (ZYRTEC) 1 MG/ML syrup Take 10 mLs (10 mg total) by mouth daily. Patient not taking: Reported on 10/13/2019 10/19/16   Lyndal PulleyKnott, Daniel, MD  erythromycin ophthalmic ointment Place a 1/2 inch ribbon of ointment into the lower eyelid. Patient not taking: Reported on 10/13/2019 10/19/16   Lyndal PulleyKnott, Daniel, MD  guaiFENesin (ROBITUSSIN) 100 MG/5ML liquid Take 5-10 mLs (100-200 mg total) by mouth every 4 (four) hours as needed for cough. Patient not taking: Reported on 10/13/2019 06/25/15   Jaynie CrumbleKirichenko, Tatyana, PA-C  ibuprofen (ADVIL,MOTRIN) 100 MG/5ML suspension Take 20 mLs (400 mg total) by mouth every 6 (six) hours as needed. Patient not taking: Reported on 10/13/2019 10/19/16   Lyndal PulleyKnott, Daniel, MD  sodium chloride (OCEAN) 0.65 % SOLN nasal spray Place 1 spray into both nostrils as needed for congestion. Patient not taking: Reported on 01/09/2016 06/25/15   Jaynie CrumbleKirichenko, Tatyana, PA-C    Scheduled Meds: . acetaminophen  1,000 mg Oral Q6H  . [START ON 10/29/2019] apixaban  5 mg Oral BID   Followed by  . apixaban  10 mg Oral BID  . dicyclomine  20 mg Oral TID AC & HS  .  morphine injection  1 mg Intravenous Once  . phenobarbital  97.2 mg Oral QHS   Infusions: . dextrose 5% lactated ringers 75 mL/hr at 10/24/19 1019   PRN Meds: acetaminophen, bisacodyl, diphenhydrAMINE, morphine injection, ondansetron **OR** ondansetron (ZOFRAN) IV, polyethylene glycol, sodium chloride flush   Allergies as of 10/13/2019 - Review Complete 10/13/2019  Allergen Reaction Noted  . Penicillins Swelling 01/05/2013    Family History  Problem Relation Age of Onset  . Hypertension Mother   . Hypertension Father   . Diabetes Father     Social History   Socioeconomic History  . Marital status: Single    Spouse name: Not on file  . Number of children: Not on file  . Years of  education: Not on file  . Highest education level: Not on file  Occupational History  . Not on file  Social Needs  . Financial resource strain: Not on file  . Food insecurity    Worry: Not on file    Inability: Not on file  . Transportation needs    Medical: Not on file    Non-medical: Not on file  Tobacco Use  . Smoking status: Never Smoker  . Smokeless tobacco: Never Used  Substance and Sexual Activity  . Alcohol use: No  . Drug use: No  . Sexual activity: Never  Lifestyle  . Physical activity    Days per week: Not on file  Minutes per session: Not on file  . Stress: Not on file  Relationships  . Social connections    Talks on phone: Not on file    Gets together: Not on file    Attends religious service: Not on file    Active member of club or organization: Not on file    Attends meetings of clubs or organizations: Not on file    Relationship status: Not on file  . Intimate partner violence    Fear of current or ex partner: Not on file    Emotionally abused: Not on file    Physically abused: Not on file    Forced sexual activity: Not on file  Other Topics Concern  . Not on file  Social History Narrative  . Not on file    REVIEW OF SYSTEMS: Constitutional: See HPI.  She is wheelchair-bound at baseline and total care at baseline. ENT:  No nose bleeds.  Lifelong snoring.  Progressed over recent years. Pulm: No shortness of breath, no cough. CV:  No palpitations, no LE edema.  No chest pain, no palpitations. GU:  No hematuria, no frequency GI: Dysphagia.  If she eats too fast she coughs, gags but mother controls this by feeding her well caught up food.. Heme: No excessive or unusual bleeding. Transfusions: None Neuro:  No headaches, no peripheral tingling or numbness.  No recent seizures. Derm:  No itching, no rash or sores.  Endocrine:  No sweats or chills.  No polyuria or dysuria Immunization: Not queried. Travel:  None beyond local counties in last few  months.    PHYSICAL EXAM: Vital signs in last 24 hours: Vitals:   10/24/19 0551 10/24/19 0802  BP:  117/77  Pulse:  (!) 116  Resp:  16  Temp: 99 F (37.2 C) 100.2 F (37.9 C)  SpO2:  98%   Wt Readings from Last 3 Encounters:  10/14/19 74.7 kg  01/09/16 64.9 kg  11/18/15 68 kg    General: Developmentally delayed.  Laying in bed seems comfortable.  Not able to answer questions Head: No facial asymmetry or swelling.  No signs of head trauma. Eyes: No scleral icterus.  No conjunctival pallor. Ears: No obvious hearing deficit. Nose: No discharge Mouth: Limited views but did open her mouth a little bit.  Fair dentition.  Tongue midline.  Mucosa pink, moist, clear. Neck: No JVD, no masses, no thyromegaly. Lungs: Clear bilaterally.  No labored breathing or cough. Heart: RRR.  No MRG.  S1, S2 present. Abdomen: Soft.  Diffusely tender without guarding or rebound.  Slightly distended and minimally tense.  Active bowel sounds.  No fluid wave/succession splash..   Rectal: Deferred Musc/Skeltl: Some skeletal deformity consistent with CP, short feet. Extremities: No CCE.  Feet are warm. Neurologic: Only able to grunt for me.  Mother was able to get her to open her mouth.  Moves all 4 limbs spontaneously. Skin: No rash, no sores Nodes: No cervical adenopathy   Intake/Output from previous day: 10/27 0701 - 10/28 0700 In: -  Out: 700 [Urine:700] Intake/Output this shift: No intake/output data recorded.  LAB RESULTS: Recent Labs    10/22/19 1331  WBC 13.9*  HGB 13.7  HCT 37.9  PLT 264   BMET Lab Results  Component Value Date   NA 135 10/20/2019   NA 139 10/18/2019   NA 138 10/17/2019   K 4.3 10/20/2019   K 4.4 10/18/2019   K 3.4 (L) 10/17/2019   CL 102 10/20/2019     CL 102 10/18/2019   CL 100 10/17/2019   CO2 26 10/20/2019   CO2 27 10/18/2019   CO2 26 10/17/2019   GLUCOSE 126 (H) 10/20/2019   GLUCOSE 125 (H) 10/18/2019   GLUCOSE 87 10/17/2019   BUN 8 10/20/2019    BUN 11 10/18/2019   BUN 8 10/17/2019   CREATININE 0.56 10/20/2019   CREATININE 0.50 10/18/2019   CREATININE 0.53 10/17/2019   CALCIUM 8.4 (L) 10/20/2019   CALCIUM 8.8 (L) 10/18/2019   CALCIUM 8.5 (L) 10/17/2019   LFT Recent Labs    10/24/19 0341  PROT 6.8  ALBUMIN 3.1*  AST 16  ALT 37  ALKPHOS 82  BILITOT 0.5  BILIDIR 0.2  IBILI 0.3   PT/INR Lab Results  Component Value Date   INR 1.1 10/13/2019   Hepatitis Panel No results for input(s): HEPBSAG, HCVAB, HEPAIGM, HEPBIGM in the last 72 hours. C-Diff No components found for: CDIFF Lipase     Component Value Date/Time   LIPASE 16 01/09/2016 0905    Drugs of Abuse  No results found for: LABOPIA, COCAINSCRNUR, LABBENZ, AMPHETMU, THCU, LABBARB   RADIOLOGY STUDIES: Dg Chest Port 1 View  Result Date: 10/23/2019 CLINICAL DATA:  Chest pain. EXAM: PORTABLE CHEST 1 VIEW COMPARISON:  October 14, 2019. FINDINGS: The heart size and mediastinal contours are within normal limits. No pneumothorax or pleural effusion is noted. Right lung is clear. Mild left basilar atelectasis is noted. The visualized skeletal structures are unremarkable. Left-sided ventriculoperitoneal shunt is again noted with associated calcification. IMPRESSION: Mild left basilar subsegmental atelectasis. Left-sided ventriculoperitoneal shunt is noted with associated calcification. Electronically Signed   By: Marijo Conception M.D.   On: 10/23/2019 08:20   Dg Abd Portable 1v  Result Date: 10/23/2019 CLINICAL DATA:  Epigastric abdominal pain. EXAM: PORTABLE ABDOMEN - 1 VIEW COMPARISON:  October 13, 2019. FINDINGS: The bowel gas pattern is normal. No free air is noted. Distal tip of ventriculoperitoneal shunt is seen in the central portion of the abdomen. No abnormal calcifications are noted. IMPRESSION: Distal tip of left-sided ventriculoperitoneal shunt is seen in central portion of the abdomen. There is no evidence of bowel obstruction or ileus. Electronically Signed    By: Marijo Conception M.D.   On: 10/23/2019 08:22     IMPRESSION:   *     Abdominal pain of unclear etiology. Overall the symptoms sound intestinal.  But cannot rule out upper GI tract disease.  No PPI etc. at home or currently. Chronic constipation but her current symptoms are different from baseline.  *     Fever.  Question aseptic meningitis.  *      New lower extremity DVT on Eliquis.  See if Eliquis this morning.    PLAN:     *    EGD.  Investigate on the off chance this is ulcer disease. Timing tomorrow?, diagnostic only since she had Eliquis today versus hold Eliquis for 1 day and pursue EGD on Friday?  Colonoscopy would be challenging and require NG tube placement to give bowel prep.  *    Starting empiric Protonix 40 mg/day.  After discussing medications with the patient's mother, okay to resume Bentyl.   Azucena Freed  10/24/2019, 11:16 AM Phone [phone]

## 2019-10-24 NOTE — Progress Notes (Signed)
   10/24/19 0505  Provider Notification  Provider Name/Title NP  Lennox Grumbles  Date Provider Notified 10/24/19  Time Provider Notified [phone]  Notification Type Page  Notification Reason Other (Comment) (Pt's HR in the 120-130s)  Response See new orders  Date of Provider Response 10/24/19  Time of Provider Response [phone]

## 2019-10-24 NOTE — Progress Notes (Signed)
Pt's mother requesting to hold scheduled Bentyl and cooling blanket. "Why are you giving her all this stuff, this how she got like this, because they gave her all these laxatives". I explained to the pt's mother that the extra medications were only given for the benefit of the pt and extra med request by you (mother). She verbalized an understanding and agreement. Will conti monitor.

## 2019-10-25 ENCOUNTER — Inpatient Hospital Stay (HOSPITAL_COMMUNITY): Payer: Medicaid Other

## 2019-10-25 ENCOUNTER — Inpatient Hospital Stay (HOSPITAL_COMMUNITY): Payer: Medicaid Other | Admitting: Anesthesiology

## 2019-10-25 ENCOUNTER — Encounter (HOSPITAL_COMMUNITY): Payer: Self-pay | Admitting: *Deleted

## 2019-10-25 ENCOUNTER — Encounter (HOSPITAL_COMMUNITY)
Admission: EM | Disposition: A | Discharge status: Home / Self Care | Payer: Self-pay | Source: Home / Self Care | Attending: Internal Medicine

## 2019-10-25 DIAGNOSIS — K259 Gastric ulcer, unspecified as acute or chronic, without hemorrhage or perforation: Secondary | ICD-10-CM | POA: Diagnosis not present

## 2019-10-25 DIAGNOSIS — K209 Esophagitis, unspecified without bleeding: Secondary | ICD-10-CM

## 2019-10-25 HISTORY — PX: ESOPHAGOGASTRODUODENOSCOPY (EGD) WITH PROPOFOL: SHX5813

## 2019-10-25 LAB — CBC WITH DIFFERENTIAL/PLATELET
Abs Immature Granulocytes: 0.16 10*3/uL — ABNORMAL HIGH (ref 0.00–0.07)
Basophils Absolute: 0.1 10*3/uL (ref 0.0–0.1)
Basophils Relative: 0 %
Eosinophils Absolute: 0.2 10*3/uL (ref 0.0–0.5)
Eosinophils Relative: 1 %
HCT: 34.3 % — ABNORMAL LOW (ref 36.0–46.0)
Hemoglobin: 12.4 g/dL (ref 12.0–15.0)
Immature Granulocytes: 1 %
Lymphocytes Relative: 6 %
Lymphs Abs: 1.2 10*3/uL (ref 0.7–4.0)
MCH: 28.6 pg (ref 26.0–34.0)
MCHC: 36.2 g/dL — ABNORMAL HIGH (ref 30.0–36.0)
MCV: 79 fL — ABNORMAL LOW (ref 80.0–100.0)
Monocytes Absolute: 1.4 10*3/uL — ABNORMAL HIGH (ref 0.1–1.0)
Monocytes Relative: 7 %
Neutro Abs: 17.4 10*3/uL — ABNORMAL HIGH (ref 1.7–7.7)
Neutrophils Relative %: 85 %
Platelets: 225 10*3/uL (ref 150–400)
RBC: 4.34 MIL/uL (ref 3.87–5.11)
RDW: 13.3 % (ref 11.5–15.5)
WBC: 20.4 10*3/uL — ABNORMAL HIGH (ref 4.0–10.5)
nRBC: 0 % (ref 0.0–0.2)

## 2019-10-25 LAB — LACTIC ACID, PLASMA: Lactic Acid, Venous: 1.1 mmol/L (ref 0.5–1.9)

## 2019-10-25 LAB — COMPREHENSIVE METABOLIC PANEL
ALT: 29 U/L (ref 0–44)
AST: 17 U/L (ref 15–41)
Albumin: 2.7 g/dL — ABNORMAL LOW (ref 3.5–5.0)
Alkaline Phosphatase: 78 U/L (ref 38–126)
Anion gap: 10 (ref 5–15)
BUN: 6 mg/dL (ref 6–20)
CO2: 24 mmol/L (ref 22–32)
Calcium: 8.5 mg/dL — ABNORMAL LOW (ref 8.9–10.3)
Chloride: 102 mmol/L (ref 98–111)
Creatinine, Ser: 0.52 mg/dL (ref 0.44–1.00)
GFR calc Af Amer: >60 mL/min (ref >60)
GFR calc non Af Amer: >60 mL/min (ref >60)
Glucose, Bld: 115 mg/dL — ABNORMAL HIGH (ref 70–99)
Potassium: 4 mmol/L (ref 3.5–5.1)
Sodium: 136 mmol/L (ref 135–145)
Total Bilirubin: 0.8 mg/dL (ref 0.3–1.2)
Total Protein: 6.2 g/dL — ABNORMAL LOW (ref 6.5–8.1)

## 2019-10-25 LAB — PROCALCITONIN: Procalcitonin: 7 ng/mL

## 2019-10-25 LAB — LIPASE, BLOOD: Lipase: 20 U/L (ref 11–51)

## 2019-10-25 SURGERY — ESOPHAGOGASTRODUODENOSCOPY (EGD) WITH PROPOFOL
Anesthesia: Monitor Anesthesia Care

## 2019-10-25 MED ORDER — DEXMEDETOMIDINE HCL 200 MCG/2ML IV SOLN
INTRAVENOUS | Status: DC | PRN
  Administered 2019-10-25: 12 ug via INTRAVENOUS

## 2019-10-25 MED ORDER — LIDOCAINE 2% (20 MG/ML) 5 ML SYRINGE
INTRAMUSCULAR | Status: DC | PRN
  Administered 2019-10-25: 80 mg via INTRAVENOUS

## 2019-10-25 MED ORDER — LACTATED RINGERS IV SOLN
INTRAVENOUS | Status: DC
  Administered 2019-10-25 (×2): via INTRAVENOUS

## 2019-10-25 MED ORDER — METOPROLOL TARTRATE 5 MG/5ML IV SOLN: 2.5000 mg | INTRAVENOUS | Status: DC | PRN

## 2019-10-25 MED ORDER — PANTOPRAZOLE SODIUM 40 MG IV SOLR
40.0000 mg | Freq: Two times a day (BID) | INTRAVENOUS | Status: DC
  Administered 2019-10-25 – 2019-10-31 (×13): 40 mg via INTRAVENOUS
  Filled 2019-10-25 (×13): qty 40

## 2019-10-25 MED ORDER — PROPOFOL 500 MG/50ML IV EMUL
INTRAVENOUS | Status: DC | PRN
  Administered 2019-10-25: 150 ug/kg/min via INTRAVENOUS

## 2019-10-25 MED ORDER — LIDOCAINE HCL 1 % IJ SOLN
INTRAMUSCULAR | Status: AC
  Filled 2019-10-25: qty 20

## 2019-10-25 MED ORDER — LIDOCAINE HCL 1 % IJ SOLN
INTRAMUSCULAR | Status: DC | PRN
  Administered 2019-10-25: 5 mL

## 2019-10-25 MED ORDER — APIXABAN 5 MG PO TABS: 5.0000 mg | ORAL_TABLET | Freq: Two times a day (BID) | ORAL | Status: DC

## 2019-10-25 MED ORDER — APIXABAN 5 MG PO TABS
10.0000 mg | ORAL_TABLET | Freq: Two times a day (BID) | ORAL | Status: DC
  Administered 2019-10-26 – 2019-10-27 (×4): 10 mg via ORAL
  Filled 2019-10-25 (×4): qty 2

## 2019-10-25 MED ORDER — CHLORHEXIDINE GLUCONATE CLOTH 2 % EX PADS
6.0000 | MEDICATED_PAD | Freq: Every day | CUTANEOUS | Status: DC
  Administered 2019-10-25 – 2019-11-10 (×15): 6 via TOPICAL

## 2019-10-25 MED ORDER — METOPROLOL TARTRATE 5 MG/5ML IV SOLN
2.5000 mg | INTRAVENOUS | Status: DC | PRN
  Administered 2019-10-25 – 2019-10-27 (×6): 2.5 mg via INTRAVENOUS
  Filled 2019-10-25 (×6): qty 5

## 2019-10-25 SURGICAL SUPPLY — 15 items

## 2019-10-25 NOTE — Anesthesia Preprocedure Evaluation (Signed)
Anesthesia Evaluation  Patient identified by MRN, date of birth, ID band Patient awake    Reviewed: Allergy & Precautions, NPO status , Patient's Chart, lab work & pertinent test results  Airway Mallampati: II  TM Distance: >3 FB Neck ROM: Limited    Dental no notable dental hx.    Pulmonary neg pulmonary ROS,    Pulmonary exam normal breath sounds clear to auscultation       Cardiovascular negative cardio ROS Normal cardiovascular exam Rhythm:Regular Rate:Normal     Neuro/Psych Seizures -,  Hydrocephalus CP negative psych ROS   GI/Hepatic negative GI ROS, Neg liver ROS,   Endo/Other  negative endocrine ROS  Renal/GU negative Renal ROS  negative genitourinary   Musculoskeletal negative musculoskeletal ROS (+)   Abdominal   Peds negative pediatric ROS (+)  Hematology negative hematology ROS (+)   Anesthesia Other Findings   Reproductive/Obstetrics negative OB ROS                             Anesthesia Physical Anesthesia Plan  ASA: III  Anesthesia Plan: MAC   Post-op Pain Management:    Induction: Intravenous  PONV Risk Score and Plan: 0  Airway Management Planned: Simple Face Mask  Additional Equipment:   Intra-op Plan:   Post-operative Plan:   Informed Consent: I have reviewed the patients History and Physical, chart, labs and discussed the procedure including the risks, benefits and alternatives for the proposed anesthesia with the patient or authorized representative who has indicated his/her understanding and acceptance.     Dental advisory given  Plan Discussed with: CRNA and Surgeon  Anesthesia Plan Comments:         Anesthesia Quick Evaluation

## 2019-10-25 NOTE — Progress Notes (Signed)
PROGRESS NOTE  Tracey Graham EYC:[id number] DOB: [birth year] DOA: 10/13/2019 PCP: Clinic, [facility]  HPI/Recap of past 24 hours: Latacha Tomlinsonis a 33 y.o.femalewith medical history significant forcerebral palsy,wheelchair-bound and fully dependent with ADLs,hydrocephalus s/p VP shunt, seizure disorder, and intellectual disability who presents to the ED for evaluation of fevers and feeling unwell. -In emergency room was febrile to 101.8, white count was 11.3, rest of her electrolytes were unremarkable, urinalysis was negative, chest x-ray was unremarkable, COVID-19 PCR was negative -CT abdomen/pelvis without contrast showed trace lower abdominal pelvic free fluid per radiology may be physiologic or related to chronic VP shunt tubing. No fluid collection, abscess, or acute intra-abdominal or pelvic finding seen. -VP shunt series showed visualized VP shunt catheter intact on skull, chest, and abdominal x-rays. CT head without contrast showed a left frontal approach CSF shunt which appeared stable along with decompressed ventricles since 2014. No acute intracranial abnormality noted. -Subsequent work-up noted left leg DVT, started anticoagulation, SQ full dose lovenox BID. -slow improvement but low grade fevers persist. LP done to r/o meningitis. LP fluid analysis showed pleocytosis, elevated protein and lymphocytic predominance with no bacteria seen on Gram stain. HSV PCR and enterovirus PCR pending.  On 10/19/2019 started on p.o. Decadron 4 mg every 6 hours x3 days by infectious disease and Tylenol as needed. No NSAIDs due to anticoagulation on full dose subcu Lovenox.  Hospital course complicated by worsening abdominal pain for which GI was consulted.  EGD done on 10/25/2019 by Dr. Rush Landmark revealed:- No gross lesions in esophagus proximally. LA Grade C esophagitis distally. - Non-bleeding gastric ulcers with a clean ulcer base (Forrest Class III). Erythematous mucosa  in the gastric body and antrum. One gastric polyp - likely fundic gland. - No gross lesions in the duodenal bulb, in the first portion of the duodenum and in the second portion of the duodenum. - No biopsies obtained due to patient still being on active anticoagulation.  Discussed with GI, Dr. Rush Landmark with recommendations for: - The patient will be observed post-procedure, until all discharge criteria are met. - Return patient to hospital ward for ongoing care. - Patient has a contact number available for emergencies. The signs and symptoms of potential delayed complications were discussed with the patient. Return to normal activities tomorrow. Written discharge instructions were provided to the patient. - Advance diet as tolerated. - IV PPI x 48 more hours and then transition to PO PPI BID 40 mg. - Observe patient's clinical course. - Send H. pylori serology and if positive should treat with quadruple therapy. - Patient should undergo repeat EGD in 41-month to ensure healing of gastric ulcers and esophagitis. - Minimize NSAIDs as able.  10/25/19: Patient was seen and examined this morning at her bedside with her mother present.  Abdominal discomfort noted with intermittent grimacing.  Post EGD this afternoon with findings as stated above.    Assessment/Plan: Principal Problem:   Fever of unknown origin (FUO) Active Problems:   Hydrocephalus (HCC)   VP (ventriculoperitoneal) shunt status   Encephalopathy   Fever of unknown origin   Lower abdominal pain   Nuchal rigidity   Aseptic meningitis   Constipation   Deep vein thrombosis (DVT) of femoral vein of left lower extremity (HCC)  Recurrent fever of unclear etiology Recently diagnosed with acute DVT of left lower extremity Started full dose anticoagulation subcu Lovenox on 10/14/2019 She continues to have low-grade fevers Other than positive DVT work-up has been unrevealing thus far. Seen by infectious  disease  Procalcitonin less than 0.10 on 10/14/2019 Lactic acid 1.1 on 10/13/2019 Urine analysis, chest x-ray, right upper quadrant abdominal ultrasound, all benign. LP fluid analysis showed lymphocytosis, pleocytosis with no bacteria seen on Gram stain.  HSV PCR negative and enterovirus PCR pending. VDRL negative Blood cultures x2 peripherally ordered on 10/19/2019, negative, final. Completed on Decadron 4 mg every 6 hours x 3 days.  Intractable abdominal pain suspect secondary to esophagitis and nonbleeding gastric ulcers post EGD 10/25/2019 by Dr. Rush Landmark. Management per GI as stated above Continue IV Protonix 40 mg twice daily x48 more hours, starting 10/25/19 through 10/27/19 then switch to po 40 mg twice daily Send H. pylori serology, if positive treat with quadruple therapy. Avoid NSAIDs Repeat endoscopy in 3 months Follow-up with [facility] [place], Dr. Early Osmond outpatient  Presumed aseptic meningitis Cultures and HSV PCR negative Infectious disease following Continue supportive care Completed 3 days of Decadron on 10/21/2019  Presumed Lovenox induced fevers  Can occur in a small percentage of population. Off Lovenox since 10/21/2019, started on Eliquis T-max thus far 99.8 on 10/22/2019 at 3 AM Lactic acid 0.9 on 10/20/2019. Negative procalcitonin less than 0.10 on 10/14/2019, bacteria etiology less likely.  Acute left lower extremity DVT. Continue anticoagulation with Eliquis  Seizure disorder Continue AED  Resolved post repletion: Hypokalemia Potassium 3.4>> 4.3 on 10/20/2019. Repleted with IV 20 mEq  Hydrocephalus s/p chronic VP shunt: VP shunt series showed visualized VP shunt catheter intact on imaging,. CT head without contrast showed a left frontal approach CSF shunt which appeared stable along with decompressed ventricles since 2014.   Dysphagia -Given cerebral palsy, SLP following, continue dysphagia diet  Chronic constipation: Resolved post laxative She  takes MiraLAX at home per her mother  DVT prophylaxis:Eliquis Code Status:Full code, confirmed with mother Family Communication:Updated her mother at bedside on 10/25/2019  Disposition Plan:Patient is currently not appropriate for discharge at this time due to GI therapy requiring IV administration.  Possible discharge to home on 10/27/2019 after she completes IV PPI as recommended by GI and is hemodynamically stable.  Consultants:   Infectious disease  Interventional radiology  GI   Procedures:   Lumbar puncture by interventional radiology on 10/17/2019. EGD 10/25/2019.  Objective: Vitals:   10/25/19 0909 10/25/19 1300 10/25/19 1404 10/25/19 1414  BP: 128/60 (!) 156/68 (!) 126/54 (!) 134/112  Pulse: (!) 110 (!) 139 (!) 113 (!) 110  Resp: (!) 22 (!) 30 (!) 28 (!) 22  Temp: 100.1 F (37.8 C) 98.7 F (37.1 C) 97.8 F (36.6 C)   TempSrc: Oral Temporal Temporal   SpO2: 100% 98% 100% 93%  Weight:      Height:        Intake/Output Summary (Last 24 hours) at 10/25/2019 1421 Last data filed at 10/25/2019 1403 Gross per 24 hour  Intake 1100 ml  Output --  Net 1100 ml   Filed Weights   10/13/19 1003 10/13/19 2006 10/14/19 0237  Weight: 72.6 kg 74.4 kg 74.7 kg    Exam:   General: 33 y.o. year-old female well-developed well-nourished in no acute distress.  Alert and minimally interactive.  Cardiovascular: Regular rate and rhythm no rubs or gallops no JVD.  Respiratory: Clear to auscultation no wheezes or rales.  Poor inspiratory effort.   Abdomen: Diffusely tender bowel sounds present.    Musculoskeletal: Trace lower extremity edema bilaterally.    Psychiatry: Mood is appropriate for condition and setting  Data Reviewed: CBC: Recent Labs  Lab 10/20/19 0650 10/22/19 1331 10/25/19 0272  WBC 11.5* 13.9* 20.4*  NEUTROABS 9.8*  --  17.4*  HGB 12.9 13.7 12.4  HCT 35.8* 37.9 34.3*  MCV 79.2* 79.3* 79.0*  PLT 240 264 364   Basic Metabolic  Panel: Recent Labs  Lab 10/20/19 0650 10/25/19 0742  NA 135 136  K 4.3 4.0  CL 102 102  CO2 26 24  GLUCOSE 126* 115*  BUN 8 6  CREATININE 0.56 0.52  CALCIUM 8.4* 8.5*   GFR: Estimated Creatinine Clearance: 99 mL/min (by C-G formula based on SCr of 0.52 mg/dL). Liver Function Tests: Recent Labs  Lab 10/24/19 0341 10/25/19 0742  AST 16 17  ALT 37 29  ALKPHOS 82 78  BILITOT 0.5 0.8  PROT 6.8 6.2*  ALBUMIN 3.1* 2.7*   Recent Labs  Lab 10/25/19 0742  LIPASE 20   No results for input(s): AMMONIA in the last 168 hours. Coagulation Profile: No results for input(s): INR, PROTIME in the last 168 hours. Cardiac Enzymes: No results for input(s): CKTOTAL, CKMB, CKMBINDEX, TROPONINI in the last 168 hours. BNP (last 3 results) No results for input(s): PROBNP in the last 8760 hours. HbA1C: No results for input(s): HGBA1C in the last 72 hours. CBG: No results for input(s): GLUCAP in the last 168 hours. Lipid Profile: No results for input(s): CHOL, HDL, LDLCALC, TRIG, CHOLHDL, LDLDIRECT in the last 72 hours. Thyroid Function Tests: No results for input(s): TSH, T4TOTAL, FREET4, T3FREE, THYROIDAB in the last 72 hours. Anemia Panel: No results for input(s): VITAMINB12, FOLATE, FERRITIN, TIBC, IRON, RETICCTPCT in the last 72 hours. Urine analysis:    Component Value Date/Time   COLORURINE YELLOW 10/13/2019 1710   APPEARANCEUR HAZY (A) 10/13/2019 1710   LABSPEC 1.029 10/13/2019 1710   PHURINE 5.0 10/13/2019 1710   GLUCOSEU NEGATIVE 10/13/2019 1710   HGBUR NEGATIVE 10/13/2019 1710   BILIRUBINUR NEGATIVE 10/13/2019 1710   KETONESUR 80 (A) 10/13/2019 1710   PROTEINUR 30 (A) 10/13/2019 1710   UROBILINOGEN 1.0 01/05/2013 2116   NITRITE NEGATIVE 10/13/2019 1710   LEUKOCYTESUR NEGATIVE 10/13/2019 1710   Sepsis Labs: @LABRCNTIP (procalcitonin:4,lacticidven:4)  ) Recent Results (from the past 240 hour(s))  Anaerobic culture     Status: None   Collection Time: 10/17/19 10:05 AM    Specimen: PATH Cytology CSF; Cerebrospinal Fluid  Result Value Ref Range Status   Specimen Description CSF  Final   Special Requests NONE  Final   Culture   Final    NO ANAEROBES ISOLATED Performed at [facility], [place] [address], [place], [place] [address]    Report Status 10/22/2019 FINAL  Final  CSF culture     Status: None   Collection Time: 10/17/19 10:05 AM   Specimen: PATH Cytology CSF; Cerebrospinal Fluid  Result Value Ref Range Status   Specimen Description CSF  Final   Special Requests NONE  Final   Gram Stain   Final    WBC PRESENT,BOTH PMN AND MONONUCLEAR NO ORGANISMS SEEN CYTOSPIN SMEAR    Culture   Final    NO GROWTH 3 DAYS Performed at [facility], [place] [address] [place], [place] [address]    Report Status 10/20/2019 FINAL  Final  Culture, blood (routine x 2)     Status: None   Collection Time: 10/19/19 10:12 AM   Specimen: BLOOD LEFT HAND  Result Value Ref Range Status   Specimen Description BLOOD LEFT HAND  Final   Special Requests   Final    BOTTLES DRAWN AEROBIC ONLY Blood Culture adequate  volume   Culture   Final    NO GROWTH 5 DAYS Performed at [facility], [place] [address], [place], [place] [address]    Report Status 10/24/2019 FINAL  Final  Culture, blood (routine x 2)     Status: None   Collection Time: 10/19/19 10:24 AM   Specimen: BLOOD LEFT HAND  Result Value Ref Range Status   Specimen Description BLOOD LEFT HAND  Final   Special Requests   Final    BOTTLES DRAWN AEROBIC ONLY Blood Culture adequate volume   Culture   Final    NO GROWTH 5 DAYS Performed at [facility], [place] [address], [place], [place] [address]    Report Status 10/24/2019 FINAL  Final      Studies: Ir US Guide Vasc Access Left  Result Date: 10/25/2019 INDICATION: Patient with history of cerebral palsy, lower extremity DVT, abdominal pain, fever, poor venous access; request received for central venous access for fluids/medications. EXAM:  ULTRASOUND AND FLUOROSCOPIC GUIDED PICC LINE INSERTION MEDICATIONS: 1% lidocaine to skin/SQ tissue ANESTHESIA/SEDATION: None FLUOROSCOPY TIME:  Fluoroscopy Time:  36 seconds (1.2 mGy). COMPLICATIONS: None immediate. TECHNIQUE: The procedure, risks, benefits, and alternatives were explained to patient's mother and informed written consent was obtained. A timeout was performed prior to the initiation of the procedure. The left upper extremity was prepped with chlorhexidine in a sterile fashion, and a sterile drape was applied covering the operative field. Maximum barrier sterile technique with sterile gowns and gloves were used for the procedure. A timeout was performed prior to the initiation of the procedure. Local anesthesia was provided with 1% lidocaine. Under direct ultrasound guidance, the left brachial vein was accessed with a micropuncture kit after the overlying soft tissues were anesthetized with 1% lidocaine. An ultrasound image was saved for documentation purposes. A guidewire was advanced to the level of the superior caval-atrial junction for measurement purposes and the PICC line was cut to length. A peel-away sheath was placed and a 41 cm, 5 [place], dual lumen was inserted to level of the superior caval-atrial junction. A post procedure spot fluoroscopic was obtained. The catheter easily aspirated and flushed and was sutured in place. A dressing was placed. The patient tolerated the procedure well without immediate post procedural complication. FINDINGS: After catheter placement, the tip lies within the superior cavoatrial junction ;the catheter aspirates and flushes normally and is ready for immediate use. IMPRESSION: Successful ultrasound and fluoroscopic guided placement of a left brachial vein approach, 41 cm 5 French, dual lumen PICC with tip at the superior caval-atrial junction. The PICC line is ready for immediate use. Read by: Rowe Robert, PA-C Electronically Signed   By: Lucrezia Europe M.D.    On: 10/25/2019 12:51   Irpicc Placement Left >5 Yrs Inc Img Guide  Result Date: 10/25/2019 INDICATION: Patient with history of cerebral palsy, lower extremity DVT, abdominal pain, fever, poor venous access; request received for central venous access for fluids/medications. EXAM: ULTRASOUND AND FLUOROSCOPIC GUIDED PICC LINE INSERTION MEDICATIONS: 1% lidocaine to skin/SQ tissue ANESTHESIA/SEDATION: None FLUOROSCOPY TIME:  Fluoroscopy Time:  36 seconds (1.2 mGy). COMPLICATIONS: None immediate. TECHNIQUE: The procedure, risks, benefits, and alternatives were explained to patient's mother and informed written consent was obtained. A timeout was performed prior to the initiation of the procedure. The left upper extremity was prepped with chlorhexidine in a sterile fashion, and a sterile drape was applied covering the operative field. Maximum barrier sterile technique with sterile gowns and gloves were used  for the procedure. A timeout was performed prior to the initiation of the procedure. Local anesthesia was provided with 1% lidocaine. Under direct ultrasound guidance, the left brachial vein was accessed with a micropuncture kit after the overlying soft tissues were anesthetized with 1% lidocaine. An ultrasound image was saved for documentation purposes. A guidewire was advanced to the level of the superior caval-atrial junction for measurement purposes and the PICC line was cut to length. A peel-away sheath was placed and a 41 cm, 5 [place], dual lumen was inserted to level of the superior caval-atrial junction. A post procedure spot fluoroscopic was obtained. The catheter easily aspirated and flushed and was sutured in place. A dressing was placed. The patient tolerated the procedure well without immediate post procedural complication. FINDINGS: After catheter placement, the tip lies within the superior cavoatrial junction ;the catheter aspirates and flushes normally and is ready for immediate use. IMPRESSION:  Successful ultrasound and fluoroscopic guided placement of a left brachial vein approach, 41 cm 5 French, dual lumen PICC with tip at the superior caval-atrial junction. The PICC line is ready for immediate use. Read by: Rowe Robert, PA-C Electronically Signed   By: Lucrezia Europe M.D.   On: 10/25/2019 12:51    Scheduled Meds:  [MAR Hold] apixaban  10 mg Oral BID   Followed by   [DJM Hold] apixaban  5 mg Oral BID   [MAR Hold] dicyclomine  10 mg Oral TID AC & HS   lidocaine       [MAR Hold]  morphine injection  1 mg Intravenous Once   [MAR Hold] pantoprazole (PROTONIX) IV  40 mg Intravenous Q12H   [MAR Hold] phenobarbital  97.2 mg Oral QHS    Continuous Infusions:  lactated ringers Stopped (10/25/19 1403)     LOS: 11 days     Kayleen Memos, MD [facility] Pager [phone]  If 7PM-7AM, please contact night-coverage [email or web address] Password [email or web address] 10/25/2019, 2:21 PM

## 2019-10-25 NOTE — Interval H&P Note (Signed)
History and Physical Interval Note:  10/25/2019 1:31 PM  Tracey Graham  has presented today for surgery, with the diagnosis of Abdominal pain..  The various methods of treatment have been discussed with the patient and family. After consideration of risks, benefits and other options for treatment, the patient has consented to  Procedure(s): ESOPHAGOGASTRODUODENOSCOPY (EGD) WITH PROPOFOL (N/A) as a surgical intervention.  The patient's history has been reviewed, patient examined, no change in status, stable for surgery.  I have reviewed the patient's chart and labs.  Questions were answered to the patient's satisfaction.     [employer]

## 2019-10-25 NOTE — Anesthesia Postprocedure Evaluation (Signed)
Anesthesia Post Note  Patient: Tracey Graham  Procedure(s) Performed: ESOPHAGOGASTRODUODENOSCOPY (EGD) WITH PROPOFOL (N/A )     Patient location during evaluation: PACU Anesthesia Type: MAC Level of consciousness: awake and alert Pain management: pain level controlled Vital Signs Assessment: post-procedure vital signs reviewed and stable Respiratory status: spontaneous breathing, nonlabored ventilation, respiratory function stable and patient connected to nasal cannula oxygen Cardiovascular status: stable and blood pressure returned to baseline Postop Assessment: no apparent nausea or vomiting Anesthetic complications: no    Last Vitals:  Vitals:   10/25/19 1404 10/25/19 1414  BP: (!) 126/54 (!) 134/112  Pulse: (!) 113 (!) 110  Resp: (!) 28 (!) 22  Temp: 36.6 C   SpO2: 100% 93%    Last Pain:  Vitals:   10/25/19 1404  TempSrc: Temporal  PainSc:                  Sahara Fujimoto S

## 2019-10-25 NOTE — Progress Notes (Signed)
   Patient Status: [facility] - In-pt  Assessment and Plan: Patient in need of venous access.   Peripherally inserted central catheter placement  ______________________________________________________________________   History of Present Illness: Tracey Graham is a 33 y.o. female   Poor venous access For GI procedure today at 1pm IV team unable to localize vein for midline Request for IR to place PICC   Allergies and medications reviewed.   Review of Systems: A 12 point ROS discussed and pertinent positives are indicated in the HPI above.  All other systems are negative.   Vital Signs: BP 128/60   Pulse (!) 110   Temp 100.1 F (37.8 C) (Oral)   Resp (!) 22   Ht 5\' 4"  (1.626 m)   Wt 164 lb 10.9 oz (74.7 kg)   LMP 10/01/2019   SpO2 100%   BMI 28.27 kg/m     Imaging reviewed.   Labs:  COAGS: Recent Labs    10/13/19 1144  INR 1.1    BMP: Recent Labs    10/17/19 0301 10/18/19 0808 10/20/19 0650 10/25/19 0742  NA 138 139 135 136  K 3.4* 4.4 4.3 4.0  CL 100 102 102 102  CO2 26 27 26 24   GLUCOSE 87 125* 126* 115*  BUN 8 11 8 6   CALCIUM 8.5* 8.8* 8.4* 8.5*  CREATININE 0.53 0.50 0.56 0.52  GFRNONAA NOT CALCULATED >60 >60 >60  GFRAA NOT CALCULATED >60 >60 >60   pts mother at bedside She is aware of procedure benefits and risks including but not limited to Infection; bleeding; vessel damage Agreeable to proceed Consent signed in IR    Electronically Signed: Lavonia Drafts, PA-C 10/25/2019, 11:00 AM   I spent a total of 15 minutes in face to face in clinical consultation, greater than 50% of which was counseling/coordinating care for venous access.

## 2019-10-25 NOTE — Plan of Care (Signed)
Patient stable, discussed POC with patient's mother, agreeable with plan, denies question/concerns at this time.   Problem: Education: Goal: Knowledge of General Education information will improve Description: Including pain rating scale, medication(s)/side effects and non-pharmacologic comfort measures Outcome: Progressing

## 2019-10-25 NOTE — Progress Notes (Signed)
Assessed BUE for PIV/midline placement by Sherren Mocha VAST RN and self.  No suitable veins noted.

## 2019-10-25 NOTE — Transfer of Care (Signed)
Immediate Anesthesia Transfer of Care Note  Patient: Tracey Graham  Procedure(s) Performed: ESOPHAGOGASTRODUODENOSCOPY (EGD) WITH PROPOFOL (N/A )  Patient Location: PACU  Anesthesia Type:MAC  Level of Consciousness: drowsy  Airway & Oxygen Therapy: Patient Spontanous Breathing and Patient connected to face mask oxygen  Post-op Assessment: Report given to RN and Post -op Vital signs reviewed and stable  Post vital signs: Reviewed and stable  Last Vitals:  Vitals Value Taken Time  BP    Temp    Pulse 113 10/25/19 1403  Resp 28 10/25/19 1403  SpO2 100 % 10/25/19 1403  Vitals shown include unvalidated device data.  Last Pain:  Vitals:   10/25/19 1300  TempSrc: Temporal  PainSc:       Patients Stated Pain Goal: 3 (11/55/20 8022)  Complications: No apparent anesthesia complications

## 2019-10-25 NOTE — Anesthesia Procedure Notes (Signed)
Procedure Name: [place] Performed by: Milford Cage, CRNA Pre-anesthesia Checklist: Emergency Drugs available, Patient identified, Suction available and Patient being monitored Oxygen Delivery Method: Nasal cannula Placement Confirmation: positive ETCO2

## 2019-10-25 NOTE — Procedures (Signed)
Left DL brachial vein PICC placed. Length 41 cm. Tip SVC/RA junction. No immediate complications. Ok to use. Meds used- 1% lidocaine to skin /SQ tissue. EBL< 2 cc.

## 2019-10-25 NOTE — Op Note (Signed)
[facility] Patient Name: Tracey Graham Procedure Date : 10/25/2019 MRN: [id number] Attending MD: Justice Britain , MD Date of Birth: 1986/04/05 CSN: [id number] Age: 33 Admit Type: Inpatient Procedure:                Upper GI endoscopy Indications:              Generalized abdominal pain Providers:                Justice Britain, MD, Carlyn Reichert, RN, Cherylynn Ridges, Technician, Lerry Paterson, CRNA Referring MD:             [facility] Medicines:                Monitored Anesthesia Care Complications:            No immediate complications. Estimated Blood Loss:     Estimated blood loss: none. Procedure:                Pre-Anesthesia Assessment:                           - Prior to the procedure, a History and Physical                            was performed, and patient medications and                            allergies were reviewed. The patient's tolerance of                            previous anesthesia was also reviewed. The risks                            and benefits of the procedure and the sedation                            options and risks were discussed with the patient.                            All questions were answered, and informed consent                            was obtained. Prior Anticoagulants: The patient has                            taken Eliquis (apixaban), last dose was day of                            procedure. ASA Grade Assessment: III - A patient                            with severe systemic disease. After reviewing the  risks and benefits, the patient was deemed in                            satisfactory condition to undergo the procedure.                           After obtaining informed consent, the endoscope was                            passed under direct vision. Throughout the                            procedure, the patient's blood pressure, pulse,  and                            oxygen saturations were monitored continuously. The                            GIF-H190 ([id number] Olympus gastroscope was                            introduced through the mouth, and advanced to the                            second part of duodenum. The upper GI endoscopy was                            accomplished without difficulty. The patient                            tolerated the procedure. Scope In: Scope Out: Findings:      No gross lesions were noted in the proximal esophagus and in the mid       esophagus.      LA Grade C (one or more mucosal breaks continuous between tops of 2 or       more mucosal folds, less than 75% circumference) esophagitis with no       bleeding was found in the distal esophagus.      Four non-bleeding cratered and linear gastric ulcers with a clean ulcer       base (Forrest Class III) were found in the gastric antrum. The largest       lesion was 10 mm in largest dimension.      Patchy moderately erythematous mucosa without bleeding was found in the       gastric body and in the gastric antrum.      One 6 mm pedunculated polyp with no bleeding and no stigmata of recent       bleeding was found on the anterior wall of the stomach - likely fundic       gland.      No gross lesions were noted in the duodenal bulb, in the first portion       of the duodenum and in the second portion of the duodenum. Impression:               - No gross lesions in esophagus proximally. LA  Grade C esophagitis distally.                           - Non-bleeding gastric ulcers with a clean ulcer                            base (Forrest Class III). Erythematous mucosa in                            the gastric body and antrum. One gastric polyp -                            likely fundic gland.                           - No gross lesions in the duodenal bulb, in the                            first portion of the  duodenum and in the second                            portion of the duodenum.                           - No biopsies obtained due to patient still being                            on active anticoagulation. Recommendation:           - The patient will be observed post-procedure,                            until all discharge criteria are met.                           - Return patient to hospital ward for ongoing care.                           - Patient has a contact number available for                            emergencies. The signs and symptoms of potential                            delayed complications were discussed with the                            patient. Return to normal activities tomorrow.                            Written discharge instructions were provided to the                            patient.                           -  Advance diet as tolerated.                           - IV PPI x 48 more hours and then transition to PO                            PPI BID 40 mg.                           - Observe patient's clinical course.                           - Send H. pylori serology and if positive should                            treat with quadruple therapy.                           - Patient should undergo repeat EGD in 76-month to                            ensure healing of gastric ulcers and esophagitis.                           - Minimize NSAIDs as able.                           - The findings and recommendations were discussed                            with the patient's family. Procedure Code(s):        --- Professional ---                           [id number] Esophagogastroduodenoscopy, flexible,                            transoral; diagnostic, including collection of                            specimen(s) by brushing or washing, when performed                            (separate procedure) Diagnosis Code(s):        --- Professional ---                            K20.9, Esophagitis, unspecified                           K25.9, Gastric ulcer, unspecified as acute or                            chronic, without hemorrhage or perforation  K31.89, Other diseases of stomach and duodenum                           R10.84, Generalized abdominal pain CPT copyright 2019 [employer]. All rights reserved. The codes documented in this report are preliminary and upon coder review may  be revised to meet current compliance requirements. Justice Britain, MD 10/25/2019 2:07:42 PM Number of Addenda: 0

## 2019-10-26 ENCOUNTER — Encounter (HOSPITAL_COMMUNITY): Payer: Self-pay

## 2019-10-26 ENCOUNTER — Inpatient Hospital Stay (HOSPITAL_COMMUNITY): Payer: Medicaid Other

## 2019-10-26 ENCOUNTER — Telehealth: Payer: Self-pay

## 2019-10-26 LAB — CBC WITH DIFFERENTIAL/PLATELET
Abs Immature Granulocytes: 0.13 10*3/uL — ABNORMAL HIGH (ref 0.00–0.07)
Basophils Absolute: 0 10*3/uL (ref 0.0–0.1)
Basophils Relative: 0 %
Eosinophils Absolute: 0.1 10*3/uL (ref 0.0–0.5)
Eosinophils Relative: 1 %
HCT: 32.6 % — ABNORMAL LOW (ref 36.0–46.0)
Hemoglobin: 11.8 g/dL — ABNORMAL LOW (ref 12.0–15.0)
Immature Granulocytes: 1 %
Lymphocytes Relative: 8 %
Lymphs Abs: 1.5 10*3/uL (ref 0.7–4.0)
MCH: 29 pg (ref 26.0–34.0)
MCHC: 36.2 g/dL — ABNORMAL HIGH (ref 30.0–36.0)
MCV: 80.1 fL (ref 80.0–100.0)
Monocytes Absolute: 1.6 10*3/uL — ABNORMAL HIGH (ref 0.1–1.0)
Monocytes Relative: 9 %
Neutro Abs: 15.5 10*3/uL — ABNORMAL HIGH (ref 1.7–7.7)
Neutrophils Relative %: 81 %
Platelets: 325 10*3/uL (ref 150–400)
RBC: 4.07 MIL/uL (ref 3.87–5.11)
RDW: 13.5 % (ref 11.5–15.5)
WBC: 18.9 10*3/uL — ABNORMAL HIGH (ref 4.0–10.5)
nRBC: 0 % (ref 0.0–0.2)

## 2019-10-26 LAB — C-REACTIVE PROTEIN: CRP: 38.9 mg/dL — ABNORMAL HIGH (ref <1.0)

## 2019-10-26 LAB — LACTIC ACID, PLASMA
Lactic Acid, Venous: 0.7 mmol/L (ref 0.5–1.9)
Lactic Acid, Venous: 0.8 mmol/L (ref 0.5–1.9)

## 2019-10-26 LAB — PROCALCITONIN: Procalcitonin: 5.22 ng/mL

## 2019-10-26 LAB — SEDIMENTATION RATE: Sed Rate: 65 mm/hr — ABNORMAL HIGH (ref 0–22)

## 2019-10-26 LAB — H. PYLORI ANTIBODY, IGG: H Pylori IgG: 0.2 Index Value (ref 0.00–0.79)

## 2019-10-26 MED ORDER — DEXTROSE-NACL 5-0.9 % IV SOLN
INTRAVENOUS | Status: DC
  Administered 2019-10-26 (×2): via INTRAVENOUS

## 2019-10-26 MED ORDER — DEXTROSE-NACL 5-0.9 % IV SOLN
INTRAVENOUS | Status: DC
  Administered 2019-10-26 – 2019-10-27 (×2): via INTRAVENOUS

## 2019-10-26 MED ORDER — SODIUM CHLORIDE 0.9 % IV SOLN
3.0000 g | Freq: Four times a day (QID) | INTRAVENOUS | Status: DC
  Administered 2019-10-26 – 2019-10-27 (×5): 3 g via INTRAVENOUS
  Filled 2019-10-26: qty 3
  Filled 2019-10-26: qty 8
  Filled 2019-10-26: qty 3
  Filled 2019-10-26: qty 8
  Filled 2019-10-26 (×2): qty 3
  Filled 2019-10-26: qty 8
  Filled 2019-10-26: qty 3

## 2019-10-26 MED ORDER — ALBUMIN HUMAN 25 % IV SOLN
50.0000 g | Freq: Once | INTRAVENOUS | Status: AC
  Administered 2019-10-26: 50 g via INTRAVENOUS
  Filled 2019-10-26: qty 200

## 2019-10-26 MED ORDER — SUCRALFATE 1 GM/10ML PO SUSP
1.0000 g | Freq: Three times a day (TID) | ORAL | Status: DC
  Administered 2019-10-26 – 2019-11-12 (×48): 1 g via ORAL
  Filled 2019-10-26 (×49): qty 10

## 2019-10-26 MED ORDER — ACETAMINOPHEN 325 MG PO TABS
650.0000 mg | ORAL_TABLET | Freq: Four times a day (QID) | ORAL | Status: DC | PRN
  Administered 2019-10-26 – 2019-11-05 (×9): 650 mg via ORAL
  Filled 2019-10-26 (×9): qty 2

## 2019-10-26 MED ORDER — IOHEXOL 300 MG/ML  SOLN
100.0000 mL | Freq: Once | INTRAMUSCULAR | Status: AC | PRN
  Administered 2019-10-26: 100 mL via INTRAVENOUS

## 2019-10-26 MED ORDER — SODIUM CHLORIDE 0.9 % IV BOLUS
500.0000 mL | Freq: Once | INTRAVENOUS | Status: AC
  Administered 2019-10-27: 500 mL via INTRAVENOUS

## 2019-10-26 MED ORDER — ACETAMINOPHEN 650 MG RE SUPP
650.0000 mg | Freq: Three times a day (TID) | RECTAL | Status: DC | PRN
  Administered 2019-10-26 – 2019-10-28 (×3): 650 mg via RECTAL
  Filled 2019-10-26 (×3): qty 1

## 2019-10-26 MED ORDER — ALBUMIN HUMAN 25 % IV SOLN
50.0000 g | Freq: Four times a day (QID) | INTRAVENOUS | Status: DC
  Filled 2019-10-26 (×2): qty 200

## 2019-10-26 NOTE — Progress Notes (Signed)
[facility]  Date of Admission:  10/13/2019      Total days of antibiotics 0   ASSESSMENT: Kathlynn Swofford is a 33 y.o. female admitted 12 days ago from home for evaluation of sudden onset fevers and abdominal pain without bowel changes. We were asked to help with her care and fevers earlier in her hospitalization; it appeared that she was suffering fevers from DVT vs aseptic meningitis given lymphocytic pleocytosis on LP, negative CT a/p and normal WBC count peripherally; she seemed to be improving with short course of steroids however 24hours after last dose she rebounded with high fevers to 103 and now with leukocytosis 18K. The leukocytosis may be expected response from steroids however ongoing fevers and what seems to be more severe abdominal pain are concerning. EGD identified gastric ulcers for which she is being treated for now with PPI infusion. H Pylori is negative. She did have some diarrhea but may be related to EGD procedure; no further episodes today.   She appears to be in significantly more pain/discomfort than last week.  Procalcitonin now also elevated.  ?mesenteric ischemia vs abscess - CT W and WO contrast of abdomen to further assess. Amylase and lactic acid to be drawn.  Will start IV unasyn for GI target. She is currently being treated for DVT that was started this admission after this was discovered.  Also scan chest for FUO work up.   H/O PCN allergy - she has had multiple uses of amoxicillin documented in medical record - OK to start Unasyn under close supervision for reaction (listed as swelling).    PLAN: 1. Please draw amylase + lactic acid 2. CTA of abdomen/pelvis for FUO 3. CT chest for FUO 4. Start Unasyn IV  5. Follow fever/wbc trends  Dr. Orvan Falconer is on over the weekend and will check in with Erie Noe and her mom.   Principal Problem:   Fever of unknown origin (FUO) Active Problems:   Hydrocephalus (HCC)   VP  (ventriculoperitoneal) shunt status   Encephalopathy   Fever of unknown origin   Lower abdominal pain   Nuchal rigidity   Aseptic meningitis   Constipation   Deep vein thrombosis (DVT) of femoral vein of left lower extremity (HCC)   . apixaban  10 mg Oral BID   Followed by  . [START ON 10/30/2019] apixaban  5 mg Oral BID  . Chlorhexidine Gluconate Cloth  6 each Topical Daily  . dicyclomine  10 mg Oral TID AC & HS  .  morphine injection  1 mg Intravenous Once  . pantoprazole (PROTONIX) IV  40 mg Intravenous Q12H  . phenobarbital  97.2 mg Oral QHS  . sucralfate  1 g Oral TID WC & HS    SUBJECTIVE: Patient is crying out for mamma. Grimacing and otherwise non-verbal.   Mother is at the bedside tearful at the lack of improvement for her daughter and upset that she cannot get more out of her to help. She is greatly worried about abdomen as being a source of her pain and discomfort. She has noticed that she has turned her head away from food/fluids which is quite out of character for her. She fears that she has more "jerking/flailing" episodes after she has sips of fluids or bites to eat. She has always struggled with constipation but had an episode of watery bowel movements yesterday following her EGD.   No changes to her breathing aside from increased  grunting/snorting sounds that mom attributes to pain.   EGD 10/29 with +gastric ulcer. H pylori serology - Continued to have fevers to [address] F with escalating WBC count (has received 4 days of decadron for discomfort a/w aseptic meningitis.   PICC line placed 10/29. No foley catheter in place and voiding spontaneously with purewik. Abd CT scan WO contrast unrevealing for pain. RUQ US also unrevealing.    Review of Systems: Review of Systems  Unable to perform ROS: Medical condition    Allergies  Allergen Reactions  . Penicillins Swelling    Did not have reaction to 5d of amoxicillin Has patient had a PCN reaction causing immediate  rash, facial/tongue/throat swelling, SOB or lightheadedness with hypotension: Yes Has patient had a PCN reaction causing severe rash involving mucus membranes or skin necrosis: No Has patient had a PCN reaction that required hospitalization No Has patient had a PCN reaction occurring within the last 10 years: No If all of the above answers are "NO", then may proceed with Cephalosporin use.     OBJECTIVE: Vitals:   10/26/19 1531 10/26/19 1552 10/26/19 1601 10/26/19 1631  BP: (!) 122/107 (!) 161/136 (!) 109/55 (!) 117/100  Pulse:    (!) 124  Resp: (!) 30 (!) 23 (!) 29 (!) 23  Temp:  (!) 101.3 F (38.5 C)    TempSrc:  Axillary  Axillary  SpO2: 92% 97% 93%   Weight:      Height:       Body mass index is 28.27 kg/m.  Physical Exam Vitals signs and nursing note reviewed.  Constitutional:      Comments: Crying out for mom in bed. Appears very uncomfortable. Bearing down and snorting sounds with rapid breathing at times.   Neck:     Musculoskeletal: Full passive range of motion without pain.     Comments: Moving neck side to side and up and down freely, not to command Cardiovascular:     Rate and Rhythm: Regular rhythm. Tachycardia present.     Heart sounds: No murmur.  Pulmonary:     Effort: Pulmonary effort is normal.     Breath sounds: Normal breath sounds.     Comments: Difficult to meaningfully assess with patient's vocalizations Abdominal:     General: Bowel sounds are normal.     Tenderness: There is generalized abdominal tenderness and tenderness in the left lower quadrant.  Skin:    General: Skin is warm and dry.     Capillary Refill: Capillary refill takes less than 2 seconds.     Comments: PICC clean and dry   Neurological:     Mental Status: She is alert.     Comments: Difficult to appreciate baseline orientation     Lab Results Lab Results  Component Value Date   WBC 18.9 (H) 10/26/2019   HGB 11.8 (L) 10/26/2019   HCT 32.6 (L) 10/26/2019   MCV 80.1  10/26/2019   PLT 325 10/26/2019    Lab Results  Component Value Date   CREATININE 0.52 10/25/2019   BUN 6 10/25/2019   NA 136 10/25/2019   K 4.0 10/25/2019   CL 102 10/25/2019   CO2 24 10/25/2019    Lab Results  Component Value Date   ALT 29 10/25/2019   AST 17 10/25/2019   ALKPHOS 78 10/25/2019   BILITOT 0.8 10/25/2019     Microbiology: Recent Results (from the past 240 hour(s))  Anaerobic culture     Status: None   Collection Time: 10/17/19  10:05 AM   Specimen: PATH Cytology CSF; Cerebrospinal Fluid  Result Value Ref Range Status   Specimen Description CSF  Final   Special Requests NONE  Final   Culture   Final    NO ANAEROBES ISOLATED Performed at [facility], [address], [place], [place] [address]    Report Status 10/22/2019 FINAL  Final  CSF culture     Status: None   Collection Time: 10/17/19 10:05 AM   Specimen: PATH Cytology CSF; Cerebrospinal Fluid  Result Value Ref Range Status   Specimen Description CSF  Final   Special Requests NONE  Final   Gram Stain   Final    WBC PRESENT,BOTH PMN AND MONONUCLEAR NO ORGANISMS SEEN CYTOSPIN SMEAR    Culture   Final    NO GROWTH 3 DAYS Performed at [facility], [place] [address] [place], [place] [address]    Report Status 10/20/2019 FINAL  Final  Culture, blood (routine x 2)     Status: None   Collection Time: 10/19/19 10:12 AM   Specimen: BLOOD LEFT HAND  Result Value Ref Range Status   Specimen Description BLOOD LEFT HAND  Final   Special Requests   Final    BOTTLES DRAWN AEROBIC ONLY Blood Culture adequate volume   Culture   Final    NO GROWTH 5 DAYS Performed at [facility], [place] [address], [place], [place] [address]    Report Status 10/24/2019 FINAL  Final  Culture, blood (routine x 2)     Status: None   Collection Time: 10/19/19 10:24 AM   Specimen: BLOOD LEFT HAND  Result Value Ref Range Status   Specimen Description BLOOD LEFT HAND  Final   Special Requests   Final     BOTTLES DRAWN AEROBIC ONLY Blood Culture adequate volume   Culture   Final    NO GROWTH 5 DAYS Performed at [facility], [place] [address] [place], [place] [address]    Report Status 10/24/2019 FINAL  Final  Culture, blood (routine x 2)     Status: None (Preliminary result)   Collection Time: 10/25/19  7:34 AM   Specimen: BLOOD LEFT HAND  Result Value Ref Range Status   Specimen Description BLOOD LEFT HAND  Final   Special Requests   Final    BOTTLES DRAWN AEROBIC ONLY Blood Culture results may not be optimal due to an inadequate volume of blood received in culture bottles   Culture   Final    NO GROWTH 1 DAY Performed at [facility], [place] [address], [place], [place] [address]    Report Status PENDING  Incomplete  Culture, blood (routine x 2)     Status: None (Preliminary result)   Collection Time: 10/25/19  7:25 PM   Specimen: Site Not Specified; Blood  Result Value Ref Range Status   Specimen Description SITE NOT SPECIFIED  Final   Special Requests   Final    BOTTLES DRAWN AEROBIC AND ANAEROBIC Blood Culture adequate volume   Culture   Final    NO GROWTH < 24 HOURS Performed at [facility], [place] [address], [place], [place] [address]    Report Status PENDING  Incomplete    Janene Madeira, MSN, NP-C [facility] Cell: [phone] Pager: [phone]  @TODAY @ 4:51 PM

## 2019-10-26 NOTE — Progress Notes (Signed)
Went to see the patient this afternoon. Still in a lot of discomfort. H. Pylori serology negative.  Recurrent fevers with Tmax 103. Ordered CT abd pelvis w/wo contrast and CT chest w contrast. Cultures negative to date. Will continue to follow cultures.

## 2019-10-26 NOTE — Telephone Encounter (Signed)
-----   Message from Irving Copas., MD sent at 10/25/2019  5:26 PM EDT ----- Regarding: Follow up Tracey Graham,Patient needs a follow-up in 4 to 6 weeks in GI clinic.Can be seen with one of the PAs or myself.EGD and LEC in 3 months, tentatively, although we will need to figure out anticoagulation at clinic visit.Thanks.GM

## 2019-10-26 NOTE — Telephone Encounter (Signed)
ROV scheduled with Dr Rush Landmark on 12/12/19 at 3:10 pm pt notified via My Chart and letter

## 2019-10-26 NOTE — Progress Notes (Signed)
PROGRESS NOTE  Tracey Graham QAE:[id number] DOB: 04-Jan-1986 DOA: 10/13/2019 PCP: Clinic, [facility] Medical  HPI/Recap of past 24 hours: Tracey Tomlinsonis a 33 y.o.femalewith medical history significant forcerebral Graham,Tracey Graham,Tracey Graham, Tracey Graham, Tracey Graham. -In emergency room was febrile to 101.8, white count was 11.3, rest of her electrolytes were unremarkable, urinalysis was negative, chest x-ray was unremarkable, COVID-19 PCR was negative -CT abdomen/pelvis without contrast showed trace lower abdominal pelvic free fluid per radiology may be physiologic or related to chronic VP Graham tubing. No fluid collection, abscess, or acute intra-abdominal or pelvic finding seen. -VP Graham series showed visualized VP Graham catheter intact on skull, chest, Tracey abdominal x-rays. CT head without contrast showed a left frontal approach CSF Graham which appeared stable along with decompressed ventricles since 2014. No acute intracranial abnormality noted. -Subsequent work-up noted left leg DVT, started anticoagulation, SQ full dose lovenox BID. -slow improvement but low grade fevers persist. LP done to r/o meningitis. LP fluid analysis showed pleocytosis, elevated protein Tracey lymphocytic predominance with no bacteria seen on Gram stain. HSV PCR Tracey enterovirus PCR pending.  On 10/19/2019 started on p.o. Decadron 4 mg every 6 hours x3 days by infectious disease Tracey Tylenol as needed. No NSAIDs due to anticoagulation on full dose subcu Lovenox.  Hospital course complicated by worsening abdominal pain for which GI was consulted.  EGD done on 10/25/2019 by Dr. Rush Landmark revealed:- No gross lesions in esophagus proximally. LA Grade C esophagitis distally. - Non-bleeding gastric ulcers with a clean ulcer base (Forrest Class III). Erythematous mucosa  in the gastric body Tracey antrum. One gastric polyp - likely fundic gland. - No gross lesions in the duodenal bulb, in the first portion of the duodenum Tracey in the second portion of the duodenum. - No biopsies obtained due to patient still being on active anticoagulation.  Discussed with GI, Dr. Rush Landmark with recommendations for: - The patient will be observed post-procedure, until all discharge criteria are met. - Return patient to hospital ward for ongoing care. - Patient has a contact number available for emergencies. The signs Tracey symptoms of potential delayed complications were discussed with the patient. Return to normal activities tomorrow. Written discharge instructions were provided to the patient. - Advance diet as tolerated. - IV PPI x 48 more hours Tracey then transition to PO PPI BID 40 mg. - Observe patient's clinical course. - Send H. pylori serology Tracey if positive should treat with quadruple therapy. - Patient should undergo repeat EGD in 70-month to ensure healing of gastric ulcers Tracey esophagitis. - Minimize NSAIDs as able.  Post EGD on 10/25/19 with findings as stated above.  10/26/19: Patient was seen Tracey examined at her bedside this morning.  Per her mother she still has a lot of abdominal pain.  Ordered Carafate 4 times daily.  Continue IV Protonix 40 mg twice daily as recommended by GI.  T-max 102.9 overnight.  Discussed with infectious disease Dr. SGraylon Goodwho will see in consultation.  Highly appreciate infectious disease assistance.    Assessment/Plan: Principal Problem:   Fever of unknown origin (FUO) Active Problems:   Tracey (HCC)   VP (ventriculoperitoneal) Graham status   Encephalopathy   Fever of unknown origin   Lower abdominal pain   Nuchal rigidity   Aseptic meningitis   Constipation   Deep vein thrombosis (DVT) of femoral vein of left lower extremity (HCC)  Recurrent fever  of unclear etiology Recently diagnosed with acute DVT of left  lower extremity Started full dose anticoagulation subcu Lovenox on 10/14/2019 She continues to have low-grade fevers Other than positive DVT work-up has been unrevealing thus far. Seen by infectious disease Urine analysis, chest x-ray, right upper quadrant abdominal ultrasound, all benign. LP fluid analysis showed lymphocytosis, pleocytosis with no bacteria seen on Gram stain.  HSV PCR negative Tracey enterovirus PCR negative. VDRL negative Blood cultures x2 peripherally ordered on 10/19/2019, negative, final. Completed on Decadron 4 mg every 6 hours x 3 days. Recurrent fevers overnight with T-max 102.9 Procalcitonin 7.0 on 10/25/2019 Repeated procalcitonin 5.22 on 10/26/2019. Lactic acid 1.1 on 10/25/2019. Discussed with infectious disease Dr. Graylon Good who will see in re-consultation Allergy to PCN ABX per ID recs.  Highly appreciate ID assistance.  Intractable abdominal pain suspect secondary to esophagitis Tracey nonbleeding gastric ulcers post EGD 10/25/2019 by Dr. Rush Landmark. Management per GI as stated above Continue IV Protonix 40 mg twice daily x48 more hours, starting 10/25/19 through 10/27/19 then switch to po 40 mg twice daily Send H. pylori serology, if positive treat with quadruple therapy. Avoid NSAIDs Repeat endoscopy in 3 months Follow-up with [facility], Dr. Early Osmond outpatient Started Carafate 4 times daily IV morphine as needed for severe abdominal pain IV Zofran as needed for nausea  Presumed aseptic meningitis Cultures Tracey HSV PCR negative Infectious disease following Continue supportive care Completed 3 days of Decadron on 10/21/2019  Presumed Lovenox induced fevers  Can occur in a small percentage of population. Off Lovenox since 10/21/2019, started on Eliquis T-max thus far 99.8 on 10/22/2019 at 3 AM Lactic acid 0.9 on 10/20/2019. Negative procalcitonin less than 0.10 on 10/14/2019, bacteria etiology less likely.  Acute left lower extremity DVT. Continue  anticoagulation with Eliquis  Tracey Graham Continue AED  Resolved post repletion: Hypokalemia Potassium 3.4>> 4.3 on 10/20/2019. Repleted with IV 20 mEq  Tracey s/p chronic VP Graham: VP Graham series showed visualized VP Graham catheter intact on imaging,. CT head without contrast showed a left frontal approach CSF Graham which appeared stable along with decompressed ventricles since 2014.   Dysphagia -Given cerebral Graham, aspiration precautions  Chronic constipation: Resolved post laxative She takes MiraLAX at home per her mother Last bowel movement was 10/25/2019  DVT prophylaxis:Eliquis Code Status:Full code, confirmed with mother Family Communication:Updated her mother at bedside on 10/25/2019  Disposition Plan:Patient is currently not appropriate for discharge at this time due to GI therapy requiring IV administration.  Possible discharge to home on 10/27/2019 after she completes IV PPI as recommended by GI Tracey is hemodynamically stable.  Consultants:   Infectious disease  Interventional radiology  GI   Procedures:   Lumbar puncture by interventional radiology on 10/17/2019. EGD 10/25/2019.  Objective: Vitals:   10/26/19 0200 10/26/19 0232 10/26/19 0415 10/26/19 0752  BP:   (!) 100/49 120/87  Pulse:   (!) 122 (!) 120  Resp: (!) 25 (!) 27 (!) 22 (!) 24  Temp: (!) 102.9 F (39.4 C)  100.2 F (37.9 C) (!) 101.1 F (38.4 C)  TempSrc: Axillary  Axillary Axillary  SpO2:    96%  Weight:      Height:        Intake/Output Summary (Last 24 hours) at 10/26/2019 0948 Last data filed at 10/26/2019 0800 Gross per 24 hour  Intake 361.15 ml  Output 275 ml  Net 86.15 ml   Filed Weights   10/13/19 1003 10/13/19 2006 10/14/19 0237  Weight: 72.6 kg 74.4  kg 74.7 kg    Exam:  . General: 33 y.o. year-old female well-developed well-nourished in no acute distress.  Alert Tracey minimally interactive.   . Cardiovascular: Regular rate Tracey rhythm no rubs  or gallops.  No JVD or thyromegaly.   Marland Kitchen Respiratory: Clear to auscultation no wheezes or rales.  Poor inspiratory effort. . Abdomen: Mildly distended Tracey diffusely tender.  Bowel sounds present. . Musculoskeletal: Trace lower extremity edema bilaterally. Marland Kitchen Psychiatry: Mood is appropriate for condition Tracey setting   Data Reviewed: CBC: Recent Labs  Lab 10/20/19 0650 10/22/19 1331 10/25/19 0742 10/26/19 0640  WBC 11.5* 13.9* 20.4* 18.9*  NEUTROABS 9.8*  --  17.4* 15.5*  HGB 12.9 13.7 12.4 11.8*  HCT 35.8* 37.9 34.3* 32.6*  MCV 79.2* 79.3* 79.0* 80.1  PLT 240 264 225 235   Basic Metabolic Panel: Recent Labs  Lab 10/20/19 0650 10/25/19 0742  NA 135 136  K 4.3 4.0  CL 102 102  CO2 26 24  GLUCOSE 126* 115*  BUN 8 6  CREATININE 0.56 0.52  CALCIUM 8.4* 8.5*   GFR: Estimated Creatinine Clearance: 99 mL/min (by C-G formula based on SCr of 0.52 mg/dL). Liver Function Tests: Recent Labs  Lab 10/24/19 0341 10/25/19 0742  AST 16 17  ALT 37 29  ALKPHOS 82 78  BILITOT 0.5 0.8  PROT 6.8 6.2*  ALBUMIN 3.1* 2.7*   Recent Labs  Lab 10/25/19 0742  LIPASE 20   No results for input(s): AMMONIA in the last 168 hours. Coagulation Profile: No results for input(s): INR, PROTIME in the last 168 hours. Cardiac Enzymes: No results for input(s): CKTOTAL, CKMB, CKMBINDEX, TROPONINI in the last 168 hours. BNP (last 3 results) No results for input(s): PROBNP in the last 8760 hours. HbA1C: No results for input(s): HGBA1C in the last 72 hours. CBG: No results for input(s): GLUCAP in the last 168 hours. Lipid Profile: No results for input(s): CHOL, HDL, LDLCALC, TRIG, CHOLHDL, LDLDIRECT in the last 72 hours. Thyroid Function Tests: No results for input(s): TSH, T4TOTAL, FREET4, T3FREE, THYROIDAB in the last 72 hours. Anemia Panel: No results for input(s): VITAMINB12, FOLATE, FERRITIN, TIBC, IRON, RETICCTPCT in the last 72 hours. Urine analysis:    Component Value Date/Time    COLORURINE YELLOW 10/13/2019 1710   APPEARANCEUR HAZY (A) 10/13/2019 1710   LABSPEC 1.029 10/13/2019 1710   PHURINE 5.0 10/13/2019 1710   GLUCOSEU NEGATIVE 10/13/2019 1710   HGBUR NEGATIVE 10/13/2019 1710   BILIRUBINUR NEGATIVE 10/13/2019 1710   KETONESUR 80 (A) 10/13/2019 1710   PROTEINUR 30 (A) 10/13/2019 1710   UROBILINOGEN 1.0 01/05/2013 2116   NITRITE NEGATIVE 10/13/2019 1710   LEUKOCYTESUR NEGATIVE 10/13/2019 1710   Sepsis Labs: @LABRCNTIP (procalcitonin:4,lacticidven:4)  ) Recent Results (from the past 240 hour(s))  Anaerobic culture     Status: None   Collection Time: 10/17/19 10:05 AM   Specimen: PATH Cytology CSF; Cerebrospinal Fluid  Result Value Ref Range Status   Specimen Description CSF  Final   Special Requests NONE  Final   Culture   Final    NO ANAEROBES ISOLATED Performed at [facility], [place] [address], [place], [place] [address]    Report Status 10/22/2019 FINAL  Final  CSF culture     Status: None   Collection Time: 10/17/19 10:05 AM   Specimen: PATH Cytology CSF; Cerebrospinal Fluid  Result Value Ref Range Status   Specimen Description CSF  Final   Special Requests NONE  Final   Gram Stain  Final    WBC PRESENT,BOTH PMN Tracey MONONUCLEAR NO ORGANISMS SEEN CYTOSPIN SMEAR    Culture   Final    NO GROWTH 3 DAYS Performed at [facility], [place] [address], [place], [place] [address]    Report Status 10/20/2019 FINAL  Final  Culture, blood (routine x 2)     Status: None   Collection Time: 10/19/19 10:12 AM   Specimen: BLOOD LEFT HAND  Result Value Ref Range Status   Specimen Description BLOOD LEFT HAND  Final   Special Requests   Final    BOTTLES DRAWN AEROBIC ONLY Blood Culture adequate volume   Culture   Final    NO GROWTH 5 DAYS Performed at [facility], [place] [address], [place], [place] [address]    Report Status 10/24/2019 FINAL  Final  Culture, blood (routine x 2)     Status: None   Collection Time: 10/19/19 10:24 AM    Specimen: BLOOD LEFT HAND  Result Value Ref Range Status   Specimen Description BLOOD LEFT HAND  Final   Special Requests   Final    BOTTLES DRAWN AEROBIC ONLY Blood Culture adequate volume   Culture   Final    NO GROWTH 5 DAYS Performed at [facility], [place] [address], [place], [place] [address]    Report Status 10/24/2019 FINAL  Final  Culture, blood (routine x 2)     Status: None (Preliminary result)   Collection Time: 10/25/19  7:34 AM   Specimen: BLOOD LEFT HAND  Result Value Ref Range Status   Specimen Description BLOOD LEFT HAND  Final   Special Requests   Final    BOTTLES DRAWN AEROBIC ONLY Blood Culture results may not be optimal due to an inadequate volume of blood received in culture bottles   Culture   Final    NO GROWTH < 24 HOURS Performed at [facility], [place] [address] [place], [place] [address]    Report Status PENDING  Incomplete  Culture, blood (routine x 2)     Status: None (Preliminary result)   Collection Time: 10/25/19  7:25 PM   Specimen: Site Not Specified; Blood  Result Value Ref Range Status   Specimen Description SITE NOT SPECIFIED  Final   Special Requests   Final    BOTTLES DRAWN AEROBIC Tracey ANAEROBIC Blood Culture adequate volume   Culture   Final    NO GROWTH < 12 HOURS Performed at [facility], [place] [address] [place], [place] [address]    Report Status PENDING  Incomplete      Studies: Irpicc Placement Left >5 Yrs Inc Img Guide  Result Date: 10/25/2019 INDICATION: Patient with history of cerebral Graham, lower extremity DVT, abdominal pain, fever, poor venous access; request received for central venous access for fluids/medications. EXAM: ULTRASOUND Tracey FLUOROSCOPIC GUIDED PICC LINE INSERTION MEDICATIONS: 1% lidocaine to skin/SQ tissue ANESTHESIA/SEDATION: None FLUOROSCOPY TIME:  Fluoroscopy Time:  36 seconds (1.2 mGy). COMPLICATIONS: None immediate. TECHNIQUE: The procedure, risks, benefits, Tracey alternatives were  explained to patient's mother Tracey informed written consent was obtained. A timeout was performed prior to the initiation of the procedure. The left upper extremity was prepped with chlorhexidine in a sterile fashion, Tracey a sterile drape was applied covering the operative field. Maximum barrier sterile technique with sterile gowns Tracey gloves were used for the procedure. A timeout was performed prior to the initiation of the procedure. Local anesthesia was provided with 1% lidocaine.  Under direct ultrasound guidance, the left brachial vein was accessed with a micropuncture kit after the overlying soft tissues were anesthetized with 1% lidocaine. An ultrasound image was saved for documentation purposes. A guidewire was advanced to the level of the superior caval-atrial junction for measurement purposes Tracey the PICC line was cut to length. A peel-away sheath was placed Tracey a 41 cm, 5 [place], dual lumen was inserted to level of the superior caval-atrial junction. A post procedure spot fluoroscopic was obtained. The catheter easily aspirated Tracey flushed Tracey was sutured in place. A dressing was placed. The patient tolerated the procedure well without immediate post procedural complication. FINDINGS: After catheter placement, the tip lies within the superior cavoatrial junction ;the catheter aspirates Tracey flushes normally Tracey is ready for immediate use. IMPRESSION: Successful ultrasound Tracey fluoroscopic guided placement of a left brachial vein approach, 41 cm 5 French, dual lumen PICC with tip at the superior caval-atrial junction. The PICC line is ready for immediate use. Read by: Rowe Robert, PA-C Electronically Signed   By: Lucrezia Europe M.D.   On: 10/25/2019 12:51    Scheduled Meds: . apixaban  10 mg Oral BID   Followed by  . [START ON 10/30/2019] apixaban  5 mg Oral BID  . Chlorhexidine Gluconate Cloth  6 each Topical Daily  . dicyclomine  10 mg Oral TID AC & HS  .  morphine injection  1 mg Intravenous Once  .  pantoprazole (PROTONIX) IV  40 mg Intravenous Q12H  . phenobarbital  97.2 mg Oral QHS  . sucralfate  1 g Oral TID WC & HS    Continuous Infusions: . dextrose 5 % Tracey 0.9% NaCl 75 mL/hr at 10/26/19 0800     LOS: 12 days     Kayleen Memos, MD [facility] Pager [phone]  If 7PM-7AM, please contact night-coverage [email or web address] Password [email or web address] 10/26/2019, 9:48 AM

## 2019-10-27 ENCOUNTER — Inpatient Hospital Stay (HOSPITAL_COMMUNITY): Payer: Medicaid Other

## 2019-10-27 DIAGNOSIS — J189 Pneumonia, unspecified organism: Secondary | ICD-10-CM

## 2019-10-27 DIAGNOSIS — T85730A Infection and inflammatory reaction due to ventricular intracranial (communicating) shunt, initial encounter: Secondary | ICD-10-CM | POA: Diagnosis not present

## 2019-10-27 DIAGNOSIS — J9601 Acute respiratory failure with hypoxia: Secondary | ICD-10-CM

## 2019-10-27 DIAGNOSIS — G9340 Encephalopathy, unspecified: Secondary | ICD-10-CM

## 2019-10-27 DIAGNOSIS — A419 Sepsis, unspecified organism: Secondary | ICD-10-CM

## 2019-10-27 DIAGNOSIS — R451 Restlessness and agitation: Secondary | ICD-10-CM

## 2019-10-27 DIAGNOSIS — R509 Fever, unspecified: Secondary | ICD-10-CM | POA: Diagnosis not present

## 2019-10-27 DIAGNOSIS — Y95 Nosocomial condition: Secondary | ICD-10-CM

## 2019-10-27 DIAGNOSIS — R10819 Abdominal tenderness, unspecified site: Secondary | ICD-10-CM | POA: Diagnosis not present

## 2019-10-27 DIAGNOSIS — I82412 Acute embolism and thrombosis of left femoral vein: Secondary | ICD-10-CM

## 2019-10-27 LAB — URINALYSIS, ROUTINE W REFLEX MICROSCOPIC
Bilirubin Urine: NEGATIVE
Glucose, UA: NEGATIVE mg/dL
Ketones, ur: NEGATIVE mg/dL
Leukocytes,Ua: NEGATIVE
Nitrite: NEGATIVE
Protein, ur: 100 mg/dL — AB
RBC / HPF: >50 RBC/hpf — ABNORMAL HIGH (ref 0–5)
Specific Gravity, Urine: 1.036 — ABNORMAL HIGH (ref 1.005–1.030)
pH: 5 (ref 5.0–8.0)

## 2019-10-27 LAB — COMPREHENSIVE METABOLIC PANEL
ALT: 16 U/L (ref 0–44)
AST: 14 U/L — ABNORMAL LOW (ref 15–41)
Albumin: 3.7 g/dL (ref 3.5–5.0)
Alkaline Phosphatase: 50 U/L (ref 38–126)
Anion gap: 11 (ref 5–15)
BUN: <5 mg/dL — ABNORMAL LOW (ref 6–20)
CO2: 23 mmol/L (ref 22–32)
Calcium: 8.2 mg/dL — ABNORMAL LOW (ref 8.9–10.3)
Chloride: 105 mmol/L (ref 98–111)
Creatinine, Ser: 0.44 mg/dL (ref 0.44–1.00)
GFR calc Af Amer: >60 mL/min (ref >60)
GFR calc non Af Amer: >60 mL/min (ref >60)
Glucose, Bld: 136 mg/dL — ABNORMAL HIGH (ref 70–99)
Potassium: 3.1 mmol/L — ABNORMAL LOW (ref 3.5–5.1)
Sodium: 139 mmol/L (ref 135–145)
Total Bilirubin: 1.1 mg/dL (ref 0.3–1.2)
Total Protein: 6.1 g/dL — ABNORMAL LOW (ref 6.5–8.1)

## 2019-10-27 LAB — CBC WITH DIFFERENTIAL/PLATELET
Abs Immature Granulocytes: 0.11 10*3/uL — ABNORMAL HIGH (ref 0.00–0.07)
Basophils Absolute: 0 10*3/uL (ref 0.0–0.1)
Basophils Relative: 0 %
Eosinophils Absolute: 0.1 10*3/uL (ref 0.0–0.5)
Eosinophils Relative: 1 %
HCT: 25.9 % — ABNORMAL LOW (ref 36.0–46.0)
Hemoglobin: 9.4 g/dL — ABNORMAL LOW (ref 12.0–15.0)
Immature Granulocytes: 1 %
Lymphocytes Relative: 10 %
Lymphs Abs: 1.2 10*3/uL (ref 0.7–4.0)
MCH: 28.8 pg (ref 26.0–34.0)
MCHC: 36.3 g/dL — ABNORMAL HIGH (ref 30.0–36.0)
MCV: 79.4 fL — ABNORMAL LOW (ref 80.0–100.0)
Monocytes Absolute: 1 10*3/uL (ref 0.1–1.0)
Monocytes Relative: 9 %
Neutro Abs: 9.1 10*3/uL — ABNORMAL HIGH (ref 1.7–7.7)
Neutrophils Relative %: 79 %
Platelets: 286 10*3/uL (ref 150–400)
RBC: 3.26 MIL/uL — ABNORMAL LOW (ref 3.87–5.11)
RDW: 13.4 % (ref 11.5–15.5)
WBC: 11.5 10*3/uL — ABNORMAL HIGH (ref 4.0–10.5)
nRBC: 0 % (ref 0.0–0.2)

## 2019-10-27 MED ORDER — LORAZEPAM 2 MG/ML IJ SOLN
0.5000 mg | Freq: Once | INTRAMUSCULAR | Status: AC
  Administered 2019-10-27: 18:00:00 via INTRAVENOUS

## 2019-10-27 MED ORDER — DM-GUAIFENESIN ER 30-600 MG PO TB12
2.0000 | ORAL_TABLET | Freq: Two times a day (BID) | ORAL | Status: DC
  Administered 2019-10-27 – 2019-11-11 (×18): 2 via ORAL
  Filled 2019-10-27 (×34): qty 2

## 2019-10-27 MED ORDER — SODIUM CHLORIDE 0.9 % IV SOLN
2.0000 g | Freq: Three times a day (TID) | INTRAVENOUS | Status: AC
  Administered 2019-10-28 – 2019-11-10 (×42): 2 g via INTRAVENOUS
  Filled 2019-10-27 (×44): qty 2

## 2019-10-27 MED ORDER — VANCOMYCIN HCL 10 G IV SOLR
1500.0000 mg | Freq: Once | INTRAVENOUS | Status: AC
  Administered 2019-10-28: 1500 mg via INTRAVENOUS
  Filled 2019-10-27 (×2): qty 1500

## 2019-10-27 MED ORDER — KETOROLAC TROMETHAMINE 15 MG/ML IJ SOLN
15.0000 mg | Freq: Once | INTRAMUSCULAR | Status: AC
  Administered 2019-10-27: 15 mg via INTRAVENOUS
  Filled 2019-10-27: qty 1

## 2019-10-27 MED ORDER — LORAZEPAM 2 MG/ML IJ SOLN
INTRAMUSCULAR | Status: AC
  Administered 2019-10-27: 18:00:00 via INTRAVENOUS
  Filled 2019-10-27: qty 1

## 2019-10-27 MED ORDER — POTASSIUM CHLORIDE 10 MEQ/50ML IV SOLN
10.0000 meq | INTRAVENOUS | Status: AC
  Administered 2019-10-28 (×4): 10 meq via INTRAVENOUS
  Filled 2019-10-27 (×8): qty 50

## 2019-10-27 MED ORDER — ACETAMINOPHEN 325 MG PO TABS
650.0000 mg | ORAL_TABLET | Freq: Four times a day (QID) | ORAL | Status: AC
  Administered 2019-10-27 – 2019-10-28 (×3): 650 mg via ORAL
  Filled 2019-10-27 (×5): qty 2

## 2019-10-27 MED ORDER — SODIUM CHLORIDE 0.9 % IV BOLUS
500.0000 mL | Freq: Once | INTRAVENOUS | Status: AC
  Administered 2019-10-27: 500 mL via INTRAVENOUS

## 2019-10-27 MED ORDER — METRONIDAZOLE IN NACL 5-0.79 MG/ML-% IV SOLN
500.0000 mg | Freq: Three times a day (TID) | INTRAVENOUS | Status: AC
  Administered 2019-10-27 – 2019-11-10 (×43): 500 mg via INTRAVENOUS
  Filled 2019-10-27 (×43): qty 100

## 2019-10-27 MED ORDER — KETOROLAC TROMETHAMINE 30 MG/ML IJ SOLN: 30.0000 mg | Freq: Once | INTRAMUSCULAR | Status: DC

## 2019-10-27 MED ORDER — ALBUMIN HUMAN 25 % IV SOLN
50.0000 g | Freq: Once | INTRAVENOUS | Status: AC
  Administered 2019-10-27: 50 g via INTRAVENOUS
  Filled 2019-10-27: qty 200

## 2019-10-27 MED ORDER — DILTIAZEM HCL-DEXTROSE 125-5 MG/125ML-% IV SOLN (PREMIX)
5.0000 mg/h | INTRAVENOUS | Status: DC
  Administered 2019-10-27: 18:00:00 5 mg/h via INTRAVENOUS
  Filled 2019-10-27 (×2): qty 125

## 2019-10-27 MED ORDER — ALBUTEROL SULFATE (2.5 MG/3ML) 0.083% IN NEBU
3.0000 mL | INHALATION_SOLUTION | Freq: Three times a day (TID) | RESPIRATORY_TRACT | Status: DC
  Administered 2019-10-27: 3 mL via RESPIRATORY_TRACT
  Filled 2019-10-27: qty 3

## 2019-10-27 MED ORDER — SODIUM CHLORIDE 0.9 % IV SOLN
2.0000 g | Freq: Once | INTRAVENOUS | Status: AC
  Administered 2019-10-28: 2 g via INTRAVENOUS
  Filled 2019-10-27: qty 2

## 2019-10-27 MED ORDER — IPRATROPIUM BROMIDE 0.02 % IN SOLN
2.5000 mL | Freq: Three times a day (TID) | RESPIRATORY_TRACT | Status: DC
  Administered 2019-10-27: 0.5 mg via RESPIRATORY_TRACT
  Filled 2019-10-27: qty 2.5

## 2019-10-27 MED ORDER — POTASSIUM CHLORIDE 10 MEQ/100ML IV SOLN
10.0000 meq | Freq: Once | INTRAVENOUS | Status: AC
  Administered 2019-10-28: 10 meq via INTRAVENOUS
  Filled 2019-10-27: qty 100

## 2019-10-27 MED ORDER — POTASSIUM CHLORIDE 10 MEQ/100ML IV SOLN
10.0000 meq | INTRAVENOUS | Status: AC
  Administered 2019-10-27 (×3): 10 meq via INTRAVENOUS
  Filled 2019-10-27 (×5): qty 100

## 2019-10-27 MED ORDER — VANCOMYCIN HCL IN DEXTROSE 1-5 GM/200ML-% IV SOLN
1000.0000 mg | Freq: Two times a day (BID) | INTRAVENOUS | Status: DC
  Administered 2019-10-28 – 2019-10-30 (×5): 1000 mg via INTRAVENOUS
  Filled 2019-10-27 (×5): qty 200

## 2019-10-27 MED ORDER — SODIUM CHLORIDE 0.9 % IV SOLN
500.0000 mg | INTRAVENOUS | Status: DC
  Administered 2019-10-27 – 2019-10-29 (×3): 500 mg via INTRAVENOUS
  Filled 2019-10-27 (×4): qty 500

## 2019-10-27 MED ORDER — LORAZEPAM 2 MG/ML IJ SOLN
0.5000 mg | Freq: Once | INTRAMUSCULAR | Status: AC
  Administered 2019-10-27: 0.5 mg via INTRAVENOUS

## 2019-10-27 MED ORDER — LEVALBUTEROL TARTRATE 45 MCG/ACT IN AERO
1.0000 | INHALATION_SPRAY | Freq: Three times a day (TID) | RESPIRATORY_TRACT | Status: DC | PRN
  Filled 2019-10-27: qty 15

## 2019-10-27 NOTE — Progress Notes (Signed)
UPDATE: Agitation reported by bedside RN Ala later this afternoon. Patient pulling at lines and [place]. Non violent restraints applied for patient's own safety.  UA negative, ordered CXR, personally reviewed showing worsening bilateral pulmonary infiltrates worse at bases. On broad spectrum IV abx.  Persistent fevers up to 103 despite abx. Negative Covid on 10/13/19. Discussed with ID, low risk for covid-19, does not recommend repeat covid. Transient hypoxia. Ordered stat ABG, pending. Started bronchodilators xopenex inh and atrovent inh TID. Continuous pulse oximetry. Soft BP and worsening tachycardia. Ordered IV albumin with goal to maintain MAP>65 when receiving IV lopressor prn for ST. 12 lead EKG ordered, pending. Low threshold for starting phenylephrine if becomes severely hypotensive in the setting of tachycardia and does not respond to BB or CCB.  Patient is at high risk for decompensation and requires higher level of care. Will transfer to ICU.   Discussed with PCCM Dr. Carlis Abbott who will see the patient.  Patient is full code.

## 2019-10-27 NOTE — Procedures (Addendum)
Central Venous Catheter Insertion Procedure Note  Procedure: Insertion of Central Venous Catheter  Indications:  vascular access, malfunctioning PICC line  Procedure Details  Informed consent was obtained for the procedure, including sedation.  Risks of lung perforation, hemorrhage, arrhythmia, and adverse drug reaction were discussed.   Maximum sterile technique was used including antiseptics, cap, gloves, gown, hand hygiene, mask and sheet.  Under sterile conditions the skin above the at base of throat, on the left internal jugular vein was prepped with chlorhexidine and covered with a sterile drape. Local anesthesia was applied to the skin and subcutaneous tissues. A 22-gauge needle was used to identify the vein. An 18-gauge needle was then inserted into the vein. A guide wire was then passed easily through the catheter. There were no arrhythmias. The catheter was then withdrawn. A 7.5 French triple-lumen was then inserted into the vessel over the guide wire. The catheter was sutured into place.  Findings: There were no changes to vital signs. Catheter was flushed with 20 cc NS. Patient did tolerate procedure well.  Recommendations: CXR ordered to verify placement.  Renee Pain, MD Board Certified by the ABIM, [place]

## 2019-10-27 NOTE — Plan of Care (Signed)
  Problem: Clinical Measurements: Goal: Diagnostic test results will improve Outcome: Progressing   Problem: Clinical Measurements: Goal: Will remain free from infection Outcome: Progressing   Problem: Health Behavior/Discharge Planning: Goal: Ability to manage health-related needs will improve Outcome: Not Met (add Reason)   Problem: Education: Goal: Knowledge of General Education information will improve Description: Including pain rating scale, medication(s)/side effects and non-pharmacologic comfort measures Outcome: Not Progressing

## 2019-10-27 NOTE — Progress Notes (Signed)
PROGRESS NOTE  Tracey Graham QIO:[id number] DOB: 01/02/1986 DOA: 10/13/2019 PCP: Clinic, [facility]  HPI/Recap of past 24 hours: Tracey Graham is a 33 y.o. female with medical history significant for cerebral palsy, wheelchair-bound and fully dependent with ADLs, hydrocephalus s/p VP shunt, seizure disorder, and intellectual disability who presents to the ED for evaluation of fevers and abdominal pain.  Patient is unable to provide history herself therefore entirety of history is obtained from mother at bedside, EDP, and chart review.  Per mother, patient was in her usual state of health until she awoke the morning of 10/12/2019.  She was crying and appeared uncomfortable which is a new change for her.  She seemed to indicate that she has been having abdominal pain.  She has also been having fevers at home.  Mother thinks that she has been having some nausea without emesis.  She does have a history of dysphagia and does choke on food on occasion.  She has not had any cough.  She has not had any diarrhea.  Mother thinks that there is some decreased urinary frequency compared to normal.  She states the only medication she is taking his phenobarbital for history of seizures but has not had any seizures for many years.  ED Course:  Initial vitals showed BP 99/85, pulse 114, RR 13, temp 101.8 Fahrenheit, SPO2 95% on room air.  Labs are notable for WBC 11.4, hemoglobin 15.4, platelets 253,000, sodium 137, potassium 4.0, bicarb 22, BUN 5, creatinine 0.61, LFTs within normal limits, lactic acid 1.1.  Urinalysis was negative for UTI.  I-STAT beta-hCG <5.0.  Blood cultures were obtained and pending.  SARS-CoV-2 test was obtained and pending.  2 view chest x-ray was negative for focal consolidation, effusion, or infiltrate.  CT abdomen/pelvis without contrast showed trace lower abdominal pelvic free fluid per radiology may be physiologic or related to chronic VP shunt tubing.  No fluid  collection, abscess, or acute intra-abdominal or pelvic finding seen.  VP shunt series showed visualized VP shunt catheter intact on skull, chest, and abdominal x-rays.  CT head without contrast showed a left frontal approach CSF shunt which appeared stable along with decompressed ventricles since 2014.  No acute intracranial abnormality noted.  Patient was given multiple rounds of IV morphine for pain and 1 L normal saline.  EDP discussed the case with neurosurgery who did not feel tap of the VP shunt was indicated and that it would be unlikely to cause abdominal pain.  -In emergency room was febrile to 101.8, white count was 11.3, rest of her electrolytes were unremarkable, urinalysis was negative, chest x-ray was unremarkable, COVID-19 PCR was negative -CT abdomen/pelvis without contrast showed trace lower abdominal pelvic free fluid per radiology may be physiologic or related to chronic VP shunt tubing. No fluid collection, abscess, or acute intra-abdominal or pelvic finding seen. -VP shunt series showed visualized VP shunt catheter intact on skull, chest, and abdominal x-rays. CT head without contrast showed a left frontal approach CSF shunt which appeared stable along with decompressed ventricles since 2014. No acute intracranial abnormality noted. -Subsequent work-up noted left leg DVT, started anticoagulation, SQ full dose lovenox BID. -slow improvement but low grade fevers persist. LP done to r/o meningitis. LP fluid analysis showed pleocytosis, elevated protein and lymphocytic predominance with no bacteria seen on Gram stain. HSV PCR and enterovirus PCR negative.  On 10/19/2019 started on p.o. Decadron 4 mg every 6 hours x3 days by infectious disease and Tylenol as needed. No NSAIDs due  to anticoagulation on full dose subcu Lovenox.  Hospital course complicated by worsening abdominal pain for which GI was consulted and EGD was completed on 10/25/2019 by Dr. Meridee Score.  Findings  include esophagitis and gastric ulcers.  Started on PPI twice daily with plan to follow-up in 3 months.  No biopsies taken due to being on Eliquis.  H. pylori serology negative.    Due to no improvement in symptomatology infectious disease was re-consulted on 10/26/2019, Unasyn started on 10/26/19 by ID.  Ordered CTA abdomen and pelvis, CT chest with contrast which showed findings concerning for multifocal pneumonia and 5.6 cm x 2.5 cm loculated appearing fluid collection adjacent to the tip of the VP shunt in the left anterior pelvis.   10/27/19: Patient was seen and examined at her bedside this morning with her mother present.  No significant changes per her mother.   Discussed the case with Dr. Orvan Falconer infectious disease, recommendation for neurosurgery involvement due to suspected VP shunt infection.  Continue IV Unasyn.    Assessment/Plan: Principal Problem:   Fever of unknown origin (FUO) Active Problems:   Hydrocephalus (HCC)   VP (ventriculoperitoneal) shunt status   Encephalopathy   Fever of unknown origin   Lower abdominal pain   Nuchal rigidity   Aseptic meningitis   Constipation   Deep vein thrombosis (DVT) of femoral vein of left lower extremity (HCC)  Recurrent fever, suspect 2/2 to possible VP shunt infection versus multifocal pneumonia T-max 103 overnight; last procalcitonin 5.22 on 10/26/2019. CT abdomen and pelvis with contrast done on 10/26/2019 independently reviewed showed loculated fluid near the tip of the VP shunt in the left anterior pelvis CT chest with contrast independently viewed showed bilateral pulmonary infiltrates Continue Unasyn, discussed with infectious disease Dr. Orvan Falconer Continue to follow cultures  Suspected VP shunt infection in the setting of hydrocephalus s/p chronic VP shunt  CT head without contrast on 10/13/2019 showed a left frontal approach CSF shunt which appeared stable along with decompressed ventricles since 2014.  CT abdomen and  pelvis with contrast done on 10/26/2019 with findings as stated above. Neurosurgery consulted on 10/27/2019.  Intractable abdominal pain suspect multifactorial secondary to possible VP shunt infection versus esophagitis and nonbleeding gastric ulcers post EGD 10/25/2019 by Dr. Meridee Score. Continue p.o. PPI twice daily as recommended by GI Continue symptomatic management Carafate 4 times daily and IV morphine as needed for severe pain  Presumed aseptic meningitis Cultures and HSV, ENTEROVIRUS PCR negative Completed 3 days of Decadron on 10/21/2019  Presumed Lovenox induced fevers  Was switched to Eliquis  Acute left lower extremity DVT. Continue anticoagulation with Eliquis  Seizure disorder Continue AED  Resolved post repletion: Hypokalemia Potassium 3.4>> 4.3 on 10/20/2019. Repleted with IV 20 mEq  Dysphagia -Given cerebral palsy, aspiration precautions  Resolved chronic constipation: Had multiple bowel movements yesterday 10/26/2019.   DVT prophylaxis:Eliquis Code Status:Full code, confirmed with mother Family Communication: Updated her mother in person on 10/27/2019.  Disposition Plan:Patient is currently not appropriate for discharge due to ongoing work-up and treatment for suspected VP shunt infection.  Consultants:   Infectious disease  Interventional radiology  GI  Neurosurgery consulted on 10/27/2019   Procedures:   Lumbar puncture by interventional radiology on 10/17/2019. EGD 10/25/2019.  Objective: Vitals:   10/27/19 0300 10/27/19 0428 10/27/19 0430 10/27/19 0700  BP: (!) 192/164 117/73 111/69   Pulse:  (!) 112    Resp: (!) 34 19  (!) 30  Temp:  (!) 96.7 F (35.9 C)  TempSrc:  Axillary    SpO2: 94%     Weight:      Height:        Intake/Output Summary (Last 24 hours) at 10/27/2019 1610 Last data filed at 10/27/2019 0600 Gross per 24 hour  Intake 2057.47 ml  Output 600 ml  Net 1457.47 ml   Filed Weights   10/13/19 1003  10/13/19 2006 10/14/19 0237  Weight: 72.6 kg 74.4 kg 74.7 kg    Exam:   General: 33 y.o. year-old female well-developed well-nourished in no acute distress.  Alert and minimally interactive.  Cardiovascular: Regular rate and rhythm no rubs or gallops no JVD or thyromegaly.    Respiratory: Clear to auscultation no wheezes no rales.  Poor inspiratory effort.    Abdomen: Mildly distended abdomen with diffuse tenderness bowel sounds present.    Musculoskeletal: Trace lower extremity edema bilaterally.  Psychiatry: Mood is appropriate for condition and setting.   Data Reviewed: CBC: Recent Labs  Lab 10/22/19 1331 10/25/19 0742 10/26/19 0640 10/27/19 0340  WBC 13.9* 20.4* 18.9* 11.5*  NEUTROABS  --  17.4* 15.5* 9.1*  HGB 13.7 12.4 11.8* 9.4*  HCT 37.9 34.3* 32.6* 25.9*  MCV 79.3* 79.0* 80.1 79.4*  PLT 264 225 325 286   Basic Metabolic Panel: Recent Labs  Lab 10/25/19 0742 10/27/19 0340  NA 136 139  K 4.0 3.1*  CL 102 105  CO2 24 23  GLUCOSE 115* 136*  BUN 6 <5*  CREATININE 0.52 0.44  CALCIUM 8.5* 8.2*   GFR: Estimated Creatinine Clearance: 99 mL/min (by C-G formula based on SCr of 0.44 mg/dL). Liver Function Tests: Recent Labs  Lab 10/24/19 0341 10/25/19 0742 10/27/19 0340  AST 16 17 14*  ALT 37 29 16  ALKPHOS 82 78 50  BILITOT 0.5 0.8 1.1  PROT 6.8 6.2* 6.1*  ALBUMIN 3.1* 2.7* 3.7   Recent Labs  Lab 10/25/19 0742  LIPASE 20   No results for input(s): AMMONIA in the last 168 hours. Coagulation Profile: No results for input(s): INR, PROTIME in the last 168 hours. Cardiac Enzymes: No results for input(s): CKTOTAL, CKMB, CKMBINDEX, TROPONINI in the last 168 hours. BNP (last 3 results) No results for input(s): PROBNP in the last 8760 hours. HbA1C: No results for input(s): HGBA1C in the last 72 hours. CBG: No results for input(s): GLUCAP in the last 168 hours. Lipid Profile: No results for input(s): CHOL, HDL, LDLCALC, TRIG, CHOLHDL, LDLDIRECT in  the last 72 hours. Thyroid Function Tests: No results for input(s): TSH, T4TOTAL, FREET4, T3FREE, THYROIDAB in the last 72 hours. Anemia Panel: No results for input(s): VITAMINB12, FOLATE, FERRITIN, TIBC, IRON, RETICCTPCT in the last 72 hours. Urine analysis:    Component Value Date/Time   COLORURINE YELLOW 10/13/2019 1710   APPEARANCEUR HAZY (A) 10/13/2019 1710   LABSPEC 1.029 10/13/2019 1710   PHURINE 5.0 10/13/2019 1710   GLUCOSEU NEGATIVE 10/13/2019 1710   HGBUR NEGATIVE 10/13/2019 1710   BILIRUBINUR NEGATIVE 10/13/2019 1710   KETONESUR 80 (A) 10/13/2019 1710   PROTEINUR 30 (A) 10/13/2019 1710   UROBILINOGEN 1.0 01/05/2013 2116   NITRITE NEGATIVE 10/13/2019 1710   LEUKOCYTESUR NEGATIVE 10/13/2019 1710   Sepsis Labs: (procalcitonin:4,lacticidven:4)  ) Recent Results (from the past 240 hour(s))  Anaerobic culture     Status: None   Collection Time: 10/17/19 10:05 AM   Specimen: PATH Cytology CSF; Cerebrospinal Fluid  Result Value Ref Range Status   Specimen Description CSF  Final   Special Requests NONE  Final  Culture   Final    NO ANAEROBES ISOLATED Performed at [facility], [address] [place], [place] [address]    Report Status 10/22/2019 FINAL  Final  CSF culture     Status: None   Collection Time: 10/17/19 10:05 AM   Specimen: PATH Cytology CSF; Cerebrospinal Fluid  Result Value Ref Range Status   Specimen Description CSF  Final   Special Requests NONE  Final   Gram Stain   Final    WBC PRESENT,BOTH PMN AND MONONUCLEAR NO ORGANISMS SEEN CYTOSPIN SMEAR    Culture   Final    NO GROWTH 3 DAYS Performed at [facility], [address] [place], [place] [address]    Report Status 10/20/2019 FINAL  Final  Culture, blood (routine x 2)     Status: None   Collection Time: 10/19/19 10:12 AM   Specimen: BLOOD LEFT HAND  Result Value Ref Range Status   Specimen Description BLOOD LEFT HAND  Final   Special Requests   Final    BOTTLES  DRAWN AEROBIC ONLY Blood Culture adequate volume   Culture   Final    NO GROWTH 5 DAYS Performed at [facility], [address], [place], [place] [address]    Report Status 10/24/2019 FINAL  Final  Culture, blood (routine x 2)     Status: None   Collection Time: 10/19/19 10:24 AM   Specimen: BLOOD LEFT HAND  Result Value Ref Range Status   Specimen Description BLOOD LEFT HAND  Final   Special Requests   Final    BOTTLES DRAWN AEROBIC ONLY Blood Culture adequate volume   Culture   Final    NO GROWTH 5 DAYS Performed at [facility], [address] [place], [place] [address]    Report Status 10/24/2019 FINAL  Final  Culture, blood (routine x 2)     Status: None (Preliminary result)   Collection Time: 10/25/19  7:34 AM   Specimen: BLOOD LEFT HAND  Result Value Ref Range Status   Specimen Description BLOOD LEFT HAND  Final   Special Requests   Final    BOTTLES DRAWN AEROBIC ONLY Blood Culture results may not be optimal due to an inadequate volume of blood received in culture bottles   Culture   Final    NO GROWTH 2 DAYS Performed at [facility], [address] [place], [place] [address]    Report Status PENDING  Incomplete  Culture, blood (routine x 2)     Status: None (Preliminary result)   Collection Time: 10/25/19  7:25 PM   Specimen: Site Not Specified; Blood  Result Value Ref Range Status   Specimen Description SITE NOT SPECIFIED  Final   Special Requests   Final    BOTTLES DRAWN AEROBIC AND ANAEROBIC Blood Culture adequate volume   Culture   Final    NO GROWTH 2 DAYS Performed at [facility], [address] [place], [place] [address]    Report Status PENDING  Incomplete      Studies: Ct Chest W Contrast  Result Date: 10/26/2019 CLINICAL DATA:  33 year old female with lower extremity DVT and concern for pulmonary embolism. Abdominal distension and pain. History of cerebral palsy. EXAM: CT CHEST, ABDOMEN, AND PELVIS WITH CONTRAST  TECHNIQUE: Multidetector CT imaging of the chest, abdomen and pelvis was performed following the standard protocol during bolus administration of intravenous contrast. CONTRAST:  OMNIPAQUE IOHEXOL 300 MG/ML  SOLN COMPARISON:  Chest radiograph dated 10/23/2019 and CT of the abdomen pelvis dated 01/06/2013. FINDINGS: Evaluation is limited due to streak artifact caused by patient's arms. CT CHEST FINDINGS Cardiovascular: There is no cardiomegaly or pericardial effusion. The thoracic aorta is unremarkable. The main pulmonary trunk and central pulmonary arteries appear grossly unremarkable. This is not a diagnostic PE protocol study. Left-sided PICC with tip close to the cavoatrial junction. Mediastinum/Nodes: There is no hilar or mediastinal adenopathy. The esophagus and the thyroid gland are grossly unremarkable. No mediastinal fluid collection. Lungs/Pleura: There is consolidative changes of the majority of the right middle lobe which may represent atelectasis or pneumonia. Patchy bilateral lower lobe ground-glass and nodular densities most concerning for pneumonia. Clinical correlation and follow-up to resolution recommended. There is no pleural effusion or pneumothorax. The central airways are patent. Musculoskeletal: No chest wall mass or suspicious bone lesions identified. CT ABDOMEN PELVIS FINDINGS There is no intra-abdominal free air. There is a small free fluid in the pelvis. Hepatobiliary: The liver is grossly unremarkable. No intrahepatic biliary ductal dilatation. No calcified gallstone or pericholecystic fluid. Pancreas: Unremarkable. No pancreatic ductal dilatation or surrounding inflammatory changes. Spleen: Normal in size without focal abnormality. Adrenals/Urinary Tract: The adrenal glands are unremarkable. There is no hydronephrosis on either side. The visualized ureters and urinary bladder appear unremarkable. Stomach/Bowel: Mild thickened appearance of the distal colon and rectosigmoid, likely  related to inflammatory changes of the pelvis. There is no bowel obstruction. The appendix is unremarkable. Vascular/Lymphatic: The abdominal aorta and IVC are unremarkable. The SMV, splenic vein, and main portal vein are patent. No portal venous gas. There is no adenopathy. Reproductive: The uterus is anteverted and grossly unremarkable. No adnexal masses. Other: A VP shunt terminates in the pelvis superior to the uterus. There is free fluid within the pelvis secondary to VP shunt. There is a 5.6 x 2.5 cm loculated appearing fluid adjacent to the tip of the VP shunt in the left anterior pelvis anterior to the uterus and the left ovary. There is apparent enhancement of the adjacent mesentery. Superimposed infection is not excluded. Clinical correlation is recommended. A second tube is noted within the pelvis, likely related to prior VP shunt. Musculoskeletal: No acute or significant osseous findings. IMPRESSION: 1. Findings concerning for multifocal pneumonia. Clinical correlation and follow-up to resolution recommended. 2. A 5.6 x 2.5 cm loculated appearing fluid collection adjacent to the tip of the VP shunt in the left anterior pelvis anterior to the uterus and the left ovary. There is apparent enhancement of the adjacent mesentery concerning for possible superimposed infection. Clinical correlation is recommended. 3. Mild thickened appearance of the distal colon and rectosigmoid likely related to inflammatory changes of the pelvis. No bowel obstruction. Normal appendix. Electronically Signed   By: Anner Crete M.D.   On: 10/26/2019 23:48   Ct Abdomen Pelvis W Contrast  Result Date: 10/26/2019 CLINICAL DATA:  33 year old female with lower extremity DVT and concern for pulmonary embolism. Abdominal distension and pain. History of cerebral palsy. EXAM: CT CHEST, ABDOMEN, AND PELVIS WITH CONTRAST TECHNIQUE: Multidetector CT imaging of the chest, abdomen and pelvis was performed following the standard  protocol during bolus administration of intravenous contrast. CONTRAST:  165mL OMNIPAQUE IOHEXOL 300 MG/ML  SOLN COMPARISON:  Chest radiograph dated 10/23/2019 and CT of the abdomen pelvis dated 01/06/2013. FINDINGS: Evaluation is limited due to streak artifact caused by patient's arms. CT CHEST FINDINGS Cardiovascular: There is no cardiomegaly or pericardial effusion. The thoracic aorta is unremarkable. The main  pulmonary trunk and central pulmonary arteries appear grossly unremarkable. This is not a diagnostic PE protocol study. Left-sided PICC with tip close to the cavoatrial junction. Mediastinum/Nodes: There is no hilar or mediastinal adenopathy. The esophagus and the thyroid gland are grossly unremarkable. No mediastinal fluid collection. Lungs/Pleura: There is consolidative changes of the majority of the right middle lobe which may represent atelectasis or pneumonia. Patchy bilateral lower lobe ground-glass and nodular densities most concerning for pneumonia. Clinical correlation and follow-up to resolution recommended. There is no pleural effusion or pneumothorax. The central airways are patent. Musculoskeletal: No chest wall mass or suspicious bone lesions identified. CT ABDOMEN PELVIS FINDINGS There is no intra-abdominal free air. There is a small free fluid in the pelvis. Hepatobiliary: The liver is grossly unremarkable. No intrahepatic biliary ductal dilatation. No calcified gallstone or pericholecystic fluid. Pancreas: Unremarkable. No pancreatic ductal dilatation or surrounding inflammatory changes. Spleen: Normal in size without focal abnormality. Adrenals/Urinary Tract: The adrenal glands are unremarkable. There is no hydronephrosis on either side. The visualized ureters and urinary bladder appear unremarkable. Stomach/Bowel: Mild thickened appearance of the distal colon and rectosigmoid, likely related to inflammatory changes of the pelvis. There is no bowel obstruction. The appendix is  unremarkable. Vascular/Lymphatic: The abdominal aorta and IVC are unremarkable. The SMV, splenic vein, and main portal vein are patent. No portal venous gas. There is no adenopathy. Reproductive: The uterus is anteverted and grossly unremarkable. No adnexal masses. Other: A VP shunt terminates in the pelvis superior to the uterus. There is free fluid within the pelvis secondary to VP shunt. There is a 5.6 x 2.5 cm loculated appearing fluid adjacent to the tip of the VP shunt in the left anterior pelvis anterior to the uterus and the left ovary. There is apparent enhancement of the adjacent mesentery. Superimposed infection is not excluded. Clinical correlation is recommended. A second tube is noted within the pelvis, likely related to prior VP shunt. Musculoskeletal: No acute or significant osseous findings. IMPRESSION: 1. Findings concerning for multifocal pneumonia. Clinical correlation and follow-up to resolution recommended. 2. A 5.6 x 2.5 cm loculated appearing fluid collection adjacent to the tip of the VP shunt in the left anterior pelvis anterior to the uterus and the left ovary. There is apparent enhancement of the adjacent mesentery concerning for possible superimposed infection. Clinical correlation is recommended. 3. Mild thickened appearance of the distal colon and rectosigmoid likely related to inflammatory changes of the pelvis. No bowel obstruction. Normal appendix. Electronically Signed   By: Elgie CollardArash  Radparvar M.D.   On: 10/26/2019 23:48   Dg Chest Port 1 View  Result Date: 10/27/2019 CLINICAL DATA:  Fever and abdominal pain EXAM: PORTABLE CHEST 1 VIEW COMPARISON:  10/23/2019 FINDINGS: Again noted are multifocal airspace opacities bilaterally. The lung volumes are low. There is a well-positioned left-sided PICC line with tip terminating near the cavoatrial junction. A VP shunt is noted. IMPRESSION: 1. Well-positioned left-sided PICC line. 2. Low lung volumes with persistent multifocal airspace  opacities better appreciated on prior CT. Electronically Signed   By: Katherine Mantlehristopher  Green M.D.   On: 10/27/2019 03:25    Scheduled Meds:  apixaban  10 mg Oral BID   Followed by   Melene Muller[START ON 10/30/2019] apixaban  5 mg Oral BID   Chlorhexidine Gluconate Cloth  6 each Topical Daily   dicyclomine  10 mg Oral TID AC & HS    morphine injection  1 mg Intravenous Once   pantoprazole (PROTONIX) IV  40 mg Intravenous Q12H  phenobarbital  97.2 mg Oral QHS   sucralfate  1 g Oral TID WC & HS    Continuous Infusions:  ampicillin-sulbactam (UNASYN) IV 3 g (10/27/19 0729)   azithromycin 500 mg (10/27/19 0419)   dextrose 5 % and 0.9% NaCl 75 mL/hr at 10/27/19 0050     LOS: 13 days     Darlin Drop, MD [facility] Pager [phone]  If 7PM-7AM, please contact night-coverage [email or web address] Password [email or web address] 10/27/2019, 9:03 AM

## 2019-10-27 NOTE — Progress Notes (Signed)
Pharmacy Antibiotic Note  Tracey Graham is a 33 y.o. female admitted on 10/13/2019 with fevers and abdominal pain.  Now with VP shunt infection and Pharmacy has been consulted for vancomycin and cefepime dosing.  She is also on Flagyl and azithromycin.  SCr 0.44, CrCL 99 ml/min, Tmax 101.9, WBC down to 11.5.   Plan: Vanc 1500mg  IV x 1, then 1gm IV Q12H for AUC 489 using SCr 0.8 Cefepime 2gm IV Q8H Azith 500mg  IV Q24H per MD, ?D/C Flagyl 500mg  IV Q8H per MD Monitor renal fxn, clinical progress, vanc AUC as indicated  Height: 5\' 4"  (162.6 cm) Weight: 164 lb 10.9 oz (74.7 kg) IBW/kg (Calculated) : 54.7  Temp (24hrs), Avg:101 F (38.3 C), Min:96.7 F (35.9 C), Max:103.1 F (39.5 C)  Recent Labs  Lab 10/22/19 1331 10/23/19 1410 10/25/19 0742 10/26/19 0640 10/26/19 1845 10/26/19 2145 10/27/19 0340  WBC 13.9*  --  20.4* 18.9*  --   --  11.5*  CREATININE  --   --  0.52  --   --   --  0.44  LATICACIDVEN  --  0.9 1.1  --  0.7 0.8  --     Estimated Creatinine Clearance: 99 mL/min (by C-G formula based on SCr of 0.44 mg/dL).    No Active Allergies  10/17 bcx - neg 10/21 CSF - ngtd 10/21 CSF - Lymps 88, Neut 8, gluc wnl, protein high 10/21 HSV pcr - negative 10/23 BCx - NGTD  Unasyn 10/30>10/31 Azith 10/30> Vanc 10/31 >> Cefepime 10/31 >> Flagyl 10/31 >>  Antavius Sperbeck D. [name], PharmD, BCPS, [place] 10/27/2019, 6:30 PM

## 2019-10-27 NOTE — Progress Notes (Signed)
Pt has been  hemodynamically unstable most of the shift  BP low  was 66/49 at 1900 rechecked went up 135/99  @ 2000 but hr remain in 140 fever 103.1  F axillary.and RR b/t 25-40bpm. All stat medications given.  Albumin  and NS bolus 500 x 1  per MD orders. Pt remained restless    occasionally calm down by mother at bedside. CT of Pelvis and abdomen done . Pt continue to desaturate ,  Oxygen 2 litter attempted but pt /mother refused. Pt repositioned in high fowlers position  oximetry probe changed x 3    But pt continued to desaturate.   Oxygen 2 l [place] given.  O2  saturation in prn morphine and lopressor iv given for pain and HR prn  But did not help much. upper 88% 90 % with 2l/min [place]. HR remained in Mid 130s-140 bpm temp 103.6 T-max with the 2 l. MD on call made aware. RN continued with  Tylenol and cooling Blanket  As orderded ,  Fever down to 101.6 and finally to 96.7 at 0430.  Marland Kitchen  Pt  Became stable after new antibiotic started for Pneumoniae and rested for the rest of the night.all lab work chest xray repeated.  See flow sheets.  Mother remain at bedside being very supportive. RN will continue to monitor pt closely and report any further change.

## 2019-10-27 NOTE — Progress Notes (Signed)
We were called about this patient's CT scan of the abdomen done yesterday which shows a small fluid collection at the tip of the VP shunt catheter within the pelvis.  The patient was admitted 2 weeks ago with mental status changes and abdominal pain and fever.  Shunt work-up at that time was done and was felt to be negative.  They ran the case by Dr. Zada Finders who I think appropriately felt that the shunt did not need a shunt tap or further work-up at that time.  Since that time she has been diagnosed with pneumonia and placed on Unasyn, and she was found to have deep venous thrombosis and is being treated with oral anticoagulant.  She had a lumbar puncture that was really not suggestive of CSF infection on 10/16/2019.  Mother states that patient had the shunt placed years ago in [place].  She thinks she has had multiple revisions.  She feels the patient has abdominal pain when she is moved.  I have reviewed the CT scan of the abdomen and the CT scan of the head.  The CT scan of the head was done 2 weeks ago.  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  Patient is completely dependent.  She is awake but grimaces.  Her arms are spastic and flexed.  She does grimace with palpation of the belly.  It is difficult to feel her [place] reservoir and pump her shunt, though I think I found it and I think it refills very slowly as expected based on her CT scan of the head 2 weeks ago.  Very difficult and complex situation: The suspected pseudocyst at the tip of the distal catheter certainly suggest shunt infection.  Ideally, I would like to tap the shunt and get a CSF sample to see if it is infected.  If it is infected, I would recommend externalization of the shunt and treatment with antibiotics to clear the infection and then shunt revision.  I have spoken to Dr. Zada Finders who will resume care on Monday and he would agree with this plan if possible.  However, the oral anticoagulant  makes me hesitant to tap the shunt since it is very difficult to feel the reservoir and the scalp is quite vascular.  Also, she would need to be off her oral anticoagulants for approximately 3 days prior to externalization of the shunt.  This makes diagnosis very difficult as we cannot get a CSF sample very well.  Therefore, I think we should consider: 1.  CT scan of the head to make sure the ventricles are not getting larger, 2.  Consultation with infectious disease to change antibiotics to cover CSF/ shunt as well as her pneumonia as in rare cases shunt infections have been cleared with IV antibiotics alone, and risks of manipulation of the shunt are probably higher in this case than is typical for shunt revision, 3.  Consideration of a IVC filter so that she can come off of oral anticoagulants for short periods to have procedures as needed such as shunt tap (which is probably safe oral anticoagulants if we could reliably palpate the reservoir to tap) and possible shunt externalization/shunt revision if necessary.

## 2019-10-27 NOTE — Progress Notes (Signed)
Received patient in bed very restless, alert but confused oriented to person only . Tem 102.4 Axillary, Resp. 40 bpm  HR 138 -140.s pt on Cardizem  Drip rate 10 mg/h .,  BP 137/119  Oxygen saturation 100 % 2 l/min [place]. IV albumin and Cardizem 10 mg/ hr   Infusing via   LUA picc double  lumen . PR Tylenol given  Noted pt had  small BM. peri care given  and pt awaiting  transfered to 4.NICU.Marland Kitchen

## 2019-10-27 NOTE — Consult Note (Signed)
NAME:  Tracey Graham, MRN:  [id number], DOB:  July 30, 1986, LOS: 13 ADMISSION DATE:  10/13/2019, CONSULTATION DATE:  10/27/19 REFERRING MD:  Margo Aye, CHIEF COMPLAINT:  Hypoxia, tachycardia   Brief History   2 week hospitalization, persistent fevers- DVT, possibly infected VP shunt. New RLL HAP.  History of present illness   Is a Tracey Graham is a 33 year old woman who presented on 10/16 with encephalopathy, abdominal pain, and fevers.  She failed to respond to antibiotics, and was found to have a left lower extremity DVT.  Since then she has had persistent fevers, raising concern for an infection associated with her VP shunt.  CT scan demonstrated a loculated fluid collection near the tip of the shunt in her pelvis.  Due to her anticoagulation she has been unable to have this fluid sampled.  She has been followed by ID.  Today she was diagnosed with right lower lobe pneumonia with antibiotics broadened.  She has a history of chronic constipation, which has been treated aggressively today.  She has had 3 bowel movements today, but complains of some abdominal pain.  Her mother relates that she has been more tachycardic today and started coughing today.  Her mother is at bedside and provided all of the history.  Past Medical History  Cerebral palsy Hydrocephalus status post VP shunt  Significant Hospital Events   Abdominal pain-EGD diagnosed peptic ulcers  Consults:  Neurosurgery Infectious disease GI  Procedures:  LP 10/21 EGD 10/29 PICC left brachial 10/29  Significant Diagnostic Tests:  CT abdomen pelvis-loculated fluid collection in pelvis around EP catheter obstructed. RML opacity with air bronchograms and GGO in RLL and LLL- possibly also pneumonia   CXR 10/31-right lower lobe pneumonia CT head 10/31-stable VP shunt, no hydrocephalus  Micro Data:  Blood 10/29 >> Blood 10/23 negative CSF 10/21 negative for aerobes and anaerobes Covid 10/17 negative Blood 10/17 negative   Antimicrobials:  Vanc 10/31>> Metronidazole 10/31>> Cefepime 10/31>> Amp sulbactam 10/30-10/31 Azithromycin 10/30  Interim history/subjective:    Objective   Blood pressure 113/84, pulse (!) 134, temperature (!) 101.9 F (38.8 C), temperature source Axillary, resp. rate 20, height 5\' 4"  (1.626 m), weight 74.7 kg, last menstrual period 10/01/2019, SpO2 100 %.        Intake/Output Summary (Last 24 hours) at 10/27/2019 1935 Last data filed at 10/27/2019 0600 Gross per 24 hour  Intake 1370 ml  Output 600 ml  Net 770 ml   Filed Weights   10/13/19 1003 10/13/19 2006 10/14/19 0237  Weight: 72.6 kg 74.4 kg 74.7 kg    Examination: General: Young woman laying in bed, mildly agitated but in no acute distress HENT: Syndromic facies, prominent periorbital tissue, [place]/AT.  Mucosa moist.  Abnormal dentition. Lungs: Rhonchi anteriorly and laterally on the right, mild tachypnea.  Able to speak in full sentences.  On nasal cannula.  No accessory muscle use. Cardiovascular: Tachycardic, regular rhythm, no murmurs.  Sinus tach on telemetry Abdomen: Obese, mildly distended, soft, nontender Extremities: Bilateral lower extremity edema, no cyanosis or clubbing.  PICC LUE. Neuro: Awake and alert, responsive to verbal and physical stimulation.  Answering some questions, questionable accuracy. Derm: no rashes or erythema  Resolved Hospital Problem list     Assessment & Plan:   Acute hypoxic respiratory failure due to right middle lobe -Agree with broad coverage of gram-positive and gram-negatives to cover resistant organisms -Ideally would be able to obtain a sputum culture, not sure how possible that is going to be -Blood cultures -Appreciate IDs assistance -  Titrate oxygen as required to maintain SPO2 greater than 92%  Sepsis-unclear if this is due to RML pneumonia versus shunt infection.  UA does not suggest urinary source of infection. -Continue broad-spectrum antibiotics -Sinus  tachycardia is due to pyrexia and possibly pain.  Treat fevers with scheduled acetaminophen-ordered. -Blood cultures  Agitation and delirium; history of cerebral palsy -Address fevers and pain first -Responds well to redirection from her mother.  No additional pharmacologic medications at this time are needed. -Delirium precautions  Hypokalemia -Additional repletion provided  DVT -Continue anticoagulation; if she would require interruption for a neurosurgical procedure, I agree with neurosurgery's recommendation for placement of an IVC filter temporarily until she could resume anticoagulation systemically  Acute microcytic anemia-concern for acute bleeding.  Although she had gastric ulcers, there was no evidence of bleeding at the time of her EGD and she remains on high-dose PPI -Evaluate for potential sources of bleeding -Continue to monitor -No current indication for transfusion; can see for hemoglobin less than 7 or significant bleeding associated with hemodynamic instability -If bleeding becomes a problem and she requires interruption of anticoagulation, recommend placement of IVC filter -Hemolysis labs    At this time Tracey Graham is stable and does not require ICU care.  We will follow up on her labs, and if there are concerns overnight the ICU team is aware of her condition.  Best practice:  Diet: Per primary Pain/Anxiety/Delirium protocol (if indicated): Acetaminophen added VAP protocol (if indicated): na DVT prophylaxis: Therapeutic anticoagulation per primary GI prophylaxis: PPI per primary Code Status: Full Family Communication: Discussed with mother bedside Disposition: Continue PCU  Labs   CBC: Recent Labs  Lab 10/22/19 1331 10/25/19 0742 10/26/19 0640 10/27/19 0340  WBC 13.9* 20.4* 18.9* 11.5*  NEUTROABS  --  17.4* 15.5* 9.1*  HGB 13.7 12.4 11.8* 9.4*  HCT 37.9 34.3* 32.6* 25.9*  MCV 79.3* 79.0* 80.1 79.4*  PLT 264 225 325 469    Basic Metabolic  Panel: Recent Labs  Lab 10/25/19 0742 10/27/19 0340  NA 136 139  K 4.0 3.1*  CL 102 105  CO2 24 23  GLUCOSE 115* 136*  BUN 6 <5*  CREATININE 0.52 0.44  CALCIUM 8.5* 8.2*   GFR: Estimated Creatinine Clearance: 99 mL/min (by C-G formula based on SCr of 0.44 mg/dL). Recent Labs  Lab 10/22/19 1331 10/23/19 1410 10/25/19 0742 10/26/19 0541 10/26/19 0640 10/26/19 1845 10/26/19 2145 10/27/19 0340  PROCALCITON  --   --  7.00 5.22  --   --   --   --   WBC 13.9*  --  20.4*  --  18.9*  --   --  11.5*  LATICACIDVEN  --  0.9 1.1  --   --  0.7 0.8  --     Liver Function Tests: Recent Labs  Lab 10/24/19 0341 10/25/19 0742 10/27/19 0340  AST 16 17 14*  ALT 37 29 16  ALKPHOS 82 78 50  BILITOT 0.5 0.8 1.1  PROT 6.8 6.2* 6.1*  ALBUMIN 3.1* 2.7* 3.7   Recent Labs  Lab 10/25/19 0742  LIPASE 20   No results for input(s): AMMONIA in the last 168 hours.  ABG No results found for: PHART, PCO2ART, PO2ART, HCO3, TCO2, ACIDBASEDEF, O2SAT   Coagulation Profile: No results for input(s): INR, PROTIME in the last 168 hours.  Cardiac Enzymes: No results for input(s): CKTOTAL, CKMB, CKMBINDEX, TROPONINI in the last 168 hours.  HbA1C: No results found for: HGBA1C  CBG: No results for input(s): GLUCAP  in the last 168 hours.  Review of Systems:   Limited by patient's ability to accurately communicate her symptoms  Past Medical History  She,  has a past medical history of Hydrocephalus (HCC), Seizures (HCC), and VP (ventriculoperitoneal) shunt status.   Surgical History    Past Surgical History:  Procedure Laterality Date  . CARDIAC SURGERY    . ESOPHAGOGASTRODUODENOSCOPY (EGD) WITH PROPOFOL N/A 10/25/2019   Procedure: ESOPHAGOGASTRODUODENOSCOPY (EGD) WITH PROPOFOL;  Surgeon: Meridee ScoreMansouraty, Netty StarringGabriel Jr., MD;  [facility];  Service: Gastroenterology;  Laterality: N/A;  . VENTRICULOPERITONEAL SHUNT       Social History   reports that she has never smoked. She has  never used smokeless tobacco. She reports that she does not drink alcohol or use drugs.   Family History   Her family history includes Diabetes in her father; Hypertension in her father and mother.   Allergies No Active Allergies   Home Medications  Prior to Admission medications   Medication Sig Start Date End Date Taking? Authorizing Provider  acetaminophen (TYLENOL) 160 MG/5ML liquid Take 500 mg by mouth every 4 (four) hours as needed for fever.   Yes [provider]  diphenhydrAMINE (BENYLIN) 12.5 MG/5ML syrup Take 10 mLs (25 mg total) by mouth every 4 (four) hours as needed for allergies. 10/19/16  Yes Lyndal PulleyKnott, Daniel, MD  docusate (COLACE) 50 MG/5ML liquid Take 10 mLs (100 mg total) by mouth daily as needed for mild constipation or moderate constipation. 01/09/16  Yes Lavera GuiseLiu, Dana Duo, MD  fluticasone [facility](FLONASE) 50 MCG/ACT nasal spray Place 2 sprays into both nostrils daily. 06/25/15  Yes Kirichenko, Tatyana, PA-C  PHENobarbital (LUMINAL) 97.2 MG tablet Take 97.2 mg by mouth at bedtime.    Yes [provider]  polyethylene glycol (MIRALAX) packet Take 17 g by mouth daily. 01/06/13  Yes Schinlever, Santina Evansatherine, PA-C  apixaban (ELIQUIS) 5 MG TABS tablet Take 2 tablets (10mg ) twice daily for 7 days, then 1 tablet (5mg ) twice daily 10/23/19   Darlin DropHall, Carole N, DO  cetirizine (ZYRTEC) 1 MG/ML syrup Take 10 mLs (10 mg total) by mouth daily. Patient not taking: Reported on 10/13/2019 10/19/16   Lyndal PulleyKnott, Daniel, MD  erythromycin ophthalmic ointment Place a 1/2 inch ribbon of ointment into the lower eyelid. Patient not taking: Reported on 10/13/2019 10/19/16   Lyndal PulleyKnott, Daniel, MD  guaiFENesin (ROBITUSSIN) 100 MG/5ML liquid Take 5-10 mLs (100-200 mg total) by mouth every 4 (four) hours as needed for cough. Patient not taking: Reported on 10/13/2019 06/25/15   Jaynie CrumbleKirichenko, Tatyana, PA-C  ibuprofen (ADVIL,MOTRIN) 100 MG/5ML suspension Take 20 mLs (400 mg total) by mouth every 6 (six) hours as  needed. Patient not taking: Reported on 10/13/2019 10/19/16   Lyndal PulleyKnott, Daniel, MD  sodium chloride (OCEAN) 0.65 % SOLN nasal spray Place 1 spray into both nostrils as needed for congestion. Patient not taking: Reported on 01/09/2016 06/25/15   Jaynie CrumbleKirichenko, Tatyana, PA-C      Steffanie DunnLaura P Salbador Fiveash, DO 10/27/19 7:35 PM [facility]

## 2019-10-27 NOTE — Progress Notes (Addendum)
Patient ID: Tracey Graham, female   DOB: August 10, 1986, 33 y.o.   MRN: [id number]         [facility]  Date of Admission:  10/13/2019           Day 2 ampicillin sulbactam ASSESSMENT: She is persistently febrile and agitated.  Recent lumbar puncture revealed CSF pleocytosis with 61 white blood cells of which 88% were lymphocytes.  Her protein was elevated at 144.  No organisms were seen on Gram stain and culture was negative.  Blood cultures have been negative.  Abdominal CT scan shows loculated fluid near the tip of her shunt in the pelvis with surrounding mesenteric enhancement.  All of this is compatible with VP shunt infection.  I will broaden her antibiotic therapy to vancomycin, cefepime and metronidazole pending further evaluation of the shunt.  PLAN: 1. Broaden antibiotic therapy to vancomycin, cefepime and metronidazole 2. Please call me for any infectious disease questions this weekend  Principal Problem:   Infection of VP (ventriculoperitoneal) shunt (HCC) Active Problems:   Lower abdominal pain   Aseptic meningitis   Hydrocephalus (HCC)   VP (ventriculoperitoneal) shunt status   Encephalopathy   Nuchal rigidity   Constipation   Deep vein thrombosis (DVT) of femoral vein of left lower extremity (HCC)   Scheduled Meds: . apixaban  10 mg Oral BID   Followed by  . [START ON 10/30/2019] apixaban  5 mg Oral BID  . Chlorhexidine Gluconate Cloth  6 each Topical Daily  . dextromethorphan-guaiFENesin  2 tablet Oral BID  . dicyclomine  10 mg Oral TID AC & HS  . LORazepam      . LORazepam  0.5 mg Intravenous Once  . LORazepam  0.5 mg Intravenous Once  .  morphine injection  1 mg Intravenous Once  . pantoprazole (PROTONIX) IV  40 mg Intravenous Q12H  . phenobarbital  97.2 mg Oral QHS  . sucralfate  1 g Oral TID WC & HS   Continuous Infusions: . albumin human    . ampicillin-sulbactam (UNASYN) IV 3 g (10/27/19 1300)  . azithromycin 500 mg  (10/27/19 0419)  . dextrose 5 % and 0.9% NaCl 75 mL/hr at 10/27/19 0050  . diltiazem (CARDIZEM) infusion    . potassium chloride     PRN Meds:.acetaminophen, acetaminophen, bisacodyl, diphenhydrAMINE, levalbuterol, lidocaine, metoprolol tartrate, morphine injection, ondansetron **OR** ondansetron (ZOFRAN) IV, polyethylene glycol, sodium chloride flush   Review of Systems: Review of Systems  Unable to perform ROS: Mental acuity    No Active Allergies  OBJECTIVE: Vitals:   10/27/19 0428 10/27/19 0430 10/27/19 0700 10/27/19 0941  BP: 117/73 111/69  113/84  Pulse: (!) 112   (!) 134  Resp: 19  (!) 30 20  Temp: (!) 96.7 F (35.9 C)   (!) 101.9 F (38.8 C)  TempSrc: Axillary   Axillary  SpO2:    100%  Weight:      Height:       Body mass index is 28.27 kg/m.  Physical Exam Constitutional:      Comments: She is agitated and repeatedly calling out "mommy".  Her mother is at the bedside.  HENT:     Head:     Comments: She has a left-sided VP shunt. Neck:     Musculoskeletal: Neck rigidity present.  Cardiovascular:     Rate and Rhythm: Normal rate and regular rhythm.     Heart sounds: No murmur.  Pulmonary:     Effort: Pulmonary effort is  normal.     Breath sounds: Normal breath sounds.  Abdominal:     Palpations: Abdomen is soft.     Tenderness: There is abdominal tenderness.     Lab Results Lab Results  Component Value Date   WBC 11.5 (H) 10/27/2019   HGB 9.4 (L) 10/27/2019   HCT 25.9 (L) 10/27/2019   MCV 79.4 (L) 10/27/2019   PLT 286 10/27/2019    Lab Results  Component Value Date   CREATININE 0.44 10/27/2019   BUN <5 (L) 10/27/2019   NA 139 10/27/2019   K 3.1 (L) 10/27/2019   CL 105 10/27/2019   CO2 23 10/27/2019    Lab Results  Component Value Date   ALT 16 10/27/2019   AST 14 (L) 10/27/2019   ALKPHOS 50 10/27/2019   BILITOT 1.1 10/27/2019     Microbiology: Recent Results (from the past 240 hour(s))  Culture, blood (routine x 2)     Status:  None   Collection Time: 10/19/19 10:12 AM   Specimen: BLOOD LEFT HAND  Result Value Ref Range Status   Specimen Description BLOOD LEFT HAND  Final   Special Requests   Final    BOTTLES DRAWN AEROBIC ONLY Blood Culture adequate volume   Culture   Final    NO GROWTH 5 DAYS Performed at [facility], [address], [place], [place] [address]    Report Status 10/24/2019 FINAL  Final  Culture, blood (routine x 2)     Status: None   Collection Time: 10/19/19 10:24 AM   Specimen: BLOOD LEFT HAND  Result Value Ref Range Status   Specimen Description BLOOD LEFT HAND  Final   Special Requests   Final    BOTTLES DRAWN AEROBIC ONLY Blood Culture adequate volume   Culture   Final    NO GROWTH 5 DAYS Performed at [facility], [place] [address] [place], [place] [address]    Report Status 10/24/2019 FINAL  Final  Culture, blood (routine x 2)     Status: None (Preliminary result)   Collection Time: 10/25/19  7:34 AM   Specimen: BLOOD LEFT HAND  Result Value Ref Range Status   Specimen Description BLOOD LEFT HAND  Final   Special Requests   Final    BOTTLES DRAWN AEROBIC ONLY Blood Culture results may not be optimal due to an inadequate volume of blood received in culture bottles   Culture   Final    NO GROWTH 2 DAYS Performed at [facility], [place] [address] [place], [place] [address]    Report Status PENDING  Incomplete  Culture, blood (routine x 2)     Status: None (Preliminary result)   Collection Time: 10/25/19  7:25 PM   Specimen: Site Not Specified; Blood  Result Value Ref Range Status   Specimen Description SITE NOT SPECIFIED  Final   Special Requests   Final    BOTTLES DRAWN AEROBIC AND ANAEROBIC Blood Culture adequate volume   Culture   Final    NO GROWTH 2 DAYS Performed at [facility], [place] [address] [place], [place] [address]    Report Status PENDING  Incomplete    Michel Bickers, MD [facility] [place] [phone] pager   [phone] cell 10/27/2019, 5:46 PM

## 2019-10-27 NOTE — Progress Notes (Signed)
Pt transferred to 4 N ICU  32

## 2019-10-28 LAB — COMPREHENSIVE METABOLIC PANEL
ALT: 25 U/L (ref 0–44)
AST: 22 U/L (ref 15–41)
Albumin: 3.3 g/dL — ABNORMAL LOW (ref 3.5–5.0)
Alkaline Phosphatase: 52 U/L (ref 38–126)
Anion gap: 10 (ref 5–15)
BUN: 6 mg/dL (ref 6–20)
CO2: 22 mmol/L (ref 22–32)
Calcium: 8.4 mg/dL — ABNORMAL LOW (ref 8.9–10.3)
Chloride: 105 mmol/L (ref 98–111)
Creatinine, Ser: 0.49 mg/dL (ref 0.44–1.00)
GFR calc Af Amer: >60 mL/min (ref >60)
GFR calc non Af Amer: >60 mL/min (ref >60)
Glucose, Bld: 146 mg/dL — ABNORMAL HIGH (ref 70–99)
Potassium: 3.2 mmol/L — ABNORMAL LOW (ref 3.5–5.1)
Sodium: 137 mmol/L (ref 135–145)
Total Bilirubin: 1.1 mg/dL (ref 0.3–1.2)
Total Protein: 6 g/dL — ABNORMAL LOW (ref 6.5–8.1)

## 2019-10-28 LAB — FERRITIN: Ferritin: 187 ng/mL (ref 11–307)

## 2019-10-28 LAB — RETICULOCYTES
Immature Retic Fract: 16.6 % — ABNORMAL HIGH (ref 2.3–15.9)
RBC.: 3.39 MIL/uL — ABNORMAL LOW (ref 3.87–5.11)
Retic Count, Absolute: 34.2 10*3/uL (ref 19.0–186.0)
Retic Ct Pct: 1 % (ref 0.4–3.1)

## 2019-10-28 LAB — LACTIC ACID, PLASMA: Lactic Acid, Venous: 0.5 mmol/L (ref 0.5–1.9)

## 2019-10-28 LAB — POTASSIUM: Potassium: 3.9 mmol/L (ref 3.5–5.1)

## 2019-10-28 LAB — IRON AND TIBC
Iron: 18 ug/dL — ABNORMAL LOW (ref 28–170)
Saturation Ratios: 16 % (ref 10.4–31.8)
TIBC: 115 ug/dL — ABNORMAL LOW (ref 250–450)
UIBC: 97 ug/dL

## 2019-10-28 LAB — CBC
HCT: 25.8 % — ABNORMAL LOW (ref 36.0–46.0)
Hemoglobin: 9.4 g/dL — ABNORMAL LOW (ref 12.0–15.0)
MCH: 29 pg (ref 26.0–34.0)
MCHC: 36.4 g/dL — ABNORMAL HIGH (ref 30.0–36.0)
MCV: 79.6 fL — ABNORMAL LOW (ref 80.0–100.0)
Platelets: 269 10*3/uL (ref 150–400)
RBC: 3.24 MIL/uL — ABNORMAL LOW (ref 3.87–5.11)
RDW: 13.5 % (ref 11.5–15.5)
WBC: 10.1 10*3/uL (ref 4.0–10.5)
nRBC: 0 % (ref 0.0–0.2)

## 2019-10-28 LAB — MAGNESIUM: Magnesium: 2.4 mg/dL (ref 1.7–2.4)

## 2019-10-28 LAB — MRSA PCR SCREENING: MRSA by PCR: NEGATIVE

## 2019-10-28 LAB — TSH: TSH: 1.057 u[IU]/mL (ref 0.350–4.500)

## 2019-10-28 LAB — VITAMIN B12: Vitamin B-12: 185 pg/mL (ref 180–914)

## 2019-10-28 LAB — LACTATE DEHYDROGENASE: LDH: 200 U/L — ABNORMAL HIGH (ref 98–192)

## 2019-10-28 LAB — APTT: aPTT: 96 seconds — ABNORMAL HIGH (ref 24–36)

## 2019-10-28 MED ORDER — METOPROLOL TARTRATE 25 MG PO TABS
12.5000 mg | ORAL_TABLET | Freq: Two times a day (BID) | ORAL | Status: DC
  Administered 2019-10-28 – 2019-10-29 (×3): 12.5 mg via ORAL
  Filled 2019-10-28 (×3): qty 1

## 2019-10-28 MED ORDER — DEXTROSE-NACL 5-0.9 % IV SOLN
INTRAVENOUS | Status: DC
  Administered 2019-10-28 – 2019-11-11 (×16): via INTRAVENOUS

## 2019-10-28 MED ORDER — HEPARIN (PORCINE) 25000 UT/250ML-% IV SOLN
1100.0000 [IU]/h | INTRAVENOUS | Status: DC
  Administered 2019-10-28: 10:00:00 1150 [IU]/h via INTRAVENOUS
  Administered 2019-10-29: 08:00:00 1100 [IU]/h via INTRAVENOUS
  Filled 2019-10-28 (×2): qty 250

## 2019-10-28 MED ORDER — MAGNESIUM SULFATE 2 GM/50ML IV SOLN
2.0000 g | Freq: Once | INTRAVENOUS | Status: AC
  Administered 2019-10-28: 07:00:00 2 g via INTRAVENOUS
  Filled 2019-10-28: qty 50

## 2019-10-28 MED ORDER — POTASSIUM CHLORIDE 10 MEQ/100ML IV SOLN: 10.0000 meq | INTRAVENOUS | Status: DC

## 2019-10-28 MED ORDER — IPRATROPIUM-ALBUTEROL 0.5-2.5 (3) MG/3ML IN SOLN: 3.0000 mL | Freq: Two times a day (BID) | RESPIRATORY_TRACT | Status: DC

## 2019-10-28 MED ORDER — IPRATROPIUM-ALBUTEROL 0.5-2.5 (3) MG/3ML IN SOLN
3.0000 mL | Freq: Two times a day (BID) | RESPIRATORY_TRACT | Status: DC
  Administered 2019-10-28 – 2019-10-30 (×5): 3 mL via RESPIRATORY_TRACT
  Filled 2019-10-28 (×5): qty 3

## 2019-10-28 NOTE — Progress Notes (Signed)
ANTICOAGULATION CONSULT NOTE - Initial Consult  Pharmacy Consult for Eliquis > Heparin Indication: DVT  No Active Allergies  Patient Measurements: Height: 5\' 4"  (162.6 cm) Weight: 164 lb 10.9 oz (74.7 kg) IBW/kg (Calculated) : 54.7 Heparin Dosing Weight: 70kg  Vital Signs: Temp: 99.4 F (37.4 C) (11/01 0800) Temp Source: Axillary (11/01 0800) BP: 134/74 (11/01 0800) Pulse Rate: 104 (11/01 0836)  Labs: Recent Labs    10/26/19 0640 10/27/19 0340 10/28/19 0118  HGB 11.8* 9.4* 9.4*  HCT 32.6* 25.9* 25.8*  PLT 325 286 269  CREATININE  --  0.44 0.49    Estimated Creatinine Clearance: 99 mL/min (by C-G formula based on SCr of 0.49 mg/dL).   Medical History: Past Medical History:  Diagnosis Date  . Hydrocephalus ([place])   . Seizures ([place])   . VP (ventriculoperitoneal) shunt status    Assessment: 69 YOF with acute DVT started on lovenox and transitioned to Eliquis for tx.  Now with possible VP shunt infection and need to transition to heparin for procedures.  Last Eliquis administered 10/31 @2250 , will follow aPTT until HL correlated.    Goal of Therapy:  Heparin level 0.3-0.7 units/ml aPTT 66-102 seconds Monitor platelets by anticoagulation protocol: Yes   Plan:  Start heparin gtt at 1150 units/hr @1100  today, ~ 12h after last Eliquis dose F/u aPTT/HL in 6 hours after start F/u plan for procedures and ability to switch back to [place], PharmD Clinical Pharmacist Please check AMION for all [place] numbers 10/28/2019 9:03 AM

## 2019-10-28 NOTE — Progress Notes (Signed)
NAME:  Tracey Graham, MRN:  [id number], DOB:  05/27/1986, LOS: 14 ADMISSION DATE:  10/13/2019, CONSULTATION DATE:  10/27/2019 REFERRING MD:  Margo AyeHall, CHIEF COMPLAINT:  hypoxia   Brief History   2 week hospitalization, persistent fevers- DVT, possibly infected VP shunt. New RLL HAP.  History of present illness   Tracey Graham is a 33 year old woman who presented on 10/16 with encephalopathy, abdominal pain, and fevers.  She failed to respond to antibiotics, and was found to have a left lower extremity DVT.  Since then she has had persistent fevers, raising concern for an infection associated with her VP shunt.  CT scan demonstrated a loculated fluid collection near the tip of the shunt in her pelvis.  Due to her anticoagulation she has been unable to have this fluid sampled.  She has been followed by ID.  Today she was diagnosed with right lower lobe pneumonia with antibiotics broadened.  She has a history of chronic constipation, which has been treated aggressively today.  She has had 3 bowel movements today, but complains of some abdominal pain.  Her mother related that she has been more tachycardic today (10/31) and started coughing today.   Past Medical History  Cerebral palsy Hydrocephalus status post VP shunt ([place]?; mult. Revisions)  Significant Hospital Events   Abdominal pain-EGD diagnosed peptic ulcers DVT  Consults:  Neurosurgery Infectious disease GI PCCM  Procedures:  LP 10/21 EGD 10/29 PICC left brachial 10/29 R IJ TLC 10/31  Significant Diagnostic Tests:  CT abdomen pelvis-loculated fluid collection in pelvis around EP catheter obstructed. RML opacity with air bronchograms and GGO in RLL and LLL- possibly also pneumonia   CXR 10/31-right lower lobe pneumonia CT head 10/31-stable VP shunt, no hydrocephalus  Micro Data:  Blood 10/29 >> Blood 10/23 negative CSF 10/21 negative for aerobes and anaerobes Covid 10/17 negative Blood 10/17 negative   Antimicrobials:  Vanc 10/31>> Metronidazole 10/31>> Cefepime 10/31>> Amp sulbactam 10/30-10/31 Azithromycin 10/30   Interim history/subjective:  No interval change o/n. Pt sleeping comfortably this morning, SaO2 94% on RA  Objective   Blood pressure 134/74, pulse (!) 103, temperature 99.4 F (37.4 C), temperature source Axillary, resp. rate (!) 28, height 5\' 4"  (1.626 m), weight 74.7 kg, last menstrual period 10/01/2019, SpO2 94 %.        Intake/Output Summary (Last 24 hours) at 10/28/2019 0832 Last data filed at 10/28/2019 0600 Gross per 24 hour  Intake 832.75 ml  Output 550 ml  Net 282.75 ml   Filed Weights   10/13/19 1003 10/13/19 2006 10/14/19 0237  Weight: 72.6 kg 74.4 kg 74.7 kg    Examination: General: NAD HENT: [place], mucous membranes moist, no sleral icterus Lungs: decreased in R base, occ. Expiratory wheeze Cardiovascular: RR, tachy ~100, no r/m/g Abdomen: SNDNT Extremities: no c/c/e, 2+ pulses Neuro: sleepy, not very interactive, does not follow requests, MAE spont. W/d from nox. Stim. (looking at others' notes, appears to be at mental status baseline) GU: clear yellow  Resolved Hospital Problem list     Assessment & Plan:  Acute hypoxic respiratory failure due to right middle lobe -Agree with broad coverage of gram-positive and gram-negatives to cover resistant organisms -Ideally would be able to obtain a sputum culture, not sure how possible that is going to be -Blood cultures -Appreciate IDs assistance -Titrate oxygen as required to maintain SPO2 greater than 92% Stable o/n and this morning --Chest PT, nebs as needed  Sepsis-unclear if this is due to RML pneumonia versus shunt  infection.  UA does not suggest urinary source of infection. -Continue broad-spectrum antibiotics -Sinus tachycardia is due to pyrexia and possibly pain.  Treat fevers with scheduled acetaminophen-ordered. -Blood cultures  Agitation and delirium; history of cerebral palsy  -Address fevers and pain first -Responds well to redirection from her mother.  No additional pharmacologic medications at this time are needed. -Delirium precautions  F/E/N -follow labs, replete as necc.   DVT -Continue anticoagulation; if she would require interruption for a neurosurgical procedure, I agree with neurosurgery's recommendation for placement of an IVC filter temporarily until she could resume anticoagulation systemically  Acute microcytic anemia-concern for acute bleeding.  Although she had gastric ulcers, there was no evidence of bleeding at the time of her EGD and she remains on high-dose PPI -Evaluate for potential sources of bleeding -Continue to monitor -No current indication for transfusion; can see for hemoglobin less than 7 or significant bleeding associated with hemodynamic instability -If bleeding becomes a problem and she requires interruption of anticoagulation, recommend placement of IVC filter -Hemolysis labs   Best practice:  Diet: per primary team Pain/Anxiety/Delirium protocol (if indicated): no additional need at this time VAP protocol (if indicated): n/a DVT prophylaxis: on eliquis; o/w per primary team GI prophylaxis: on PPI per primary team Glucose control: n/a Mobility: n/a Code Status: full Family Communication: will f/u with mother later today Disposition: transferred to ICU for closer observation;   Labs   CBC: Recent Labs  Lab 10/22/19 1331 10/25/19 0742 10/26/19 0640 10/27/19 0340 10/28/19 0118  WBC 13.9* 20.4* 18.9* 11.5* 10.1  NEUTROABS  --  17.4* 15.5* 9.1*  --   HGB 13.7 12.4 11.8* 9.4* 9.4*  HCT 37.9 34.3* 32.6* 25.9* 25.8*  MCV 79.3* 79.0* 80.1 79.4* 79.6*  PLT 264 225 325 286 638    Basic Metabolic Panel: Recent Labs  Lab 10/25/19 0742 10/27/19 0340 10/28/19 0118  NA 136 139 137  K 4.0 3.1* 3.2*  CL 102 105 105  CO2 24 23 22   GLUCOSE 115* 136* 146*  BUN 6 <5* 6  CREATININE 0.52 0.44 0.49  CALCIUM 8.5*  8.2* 8.4*   GFR: Estimated Creatinine Clearance: 99 mL/min (by C-G formula based on SCr of 0.49 mg/dL). Recent Labs  Lab 10/25/19 0742 10/26/19 0541 10/26/19 0640 10/26/19 1845 10/26/19 2145 10/27/19 0340 10/28/19 0118  PROCALCITON 7.00 5.22  --   --   --   --   --   WBC 20.4*  --  18.9*  --   --  11.5* 10.1  LATICACIDVEN 1.1  --   --  0.7 0.8  --  0.5    Liver Function Tests: Recent Labs  Lab 10/24/19 0341 10/25/19 0742 10/27/19 0340 10/28/19 0118  AST 16 17 14* 22  ALT 37 29 16 25   ALKPHOS 82 78 50 52  BILITOT 0.5 0.8 1.1 1.1  PROT 6.8 6.2* 6.1* 6.0*  ALBUMIN 3.1* 2.7* 3.7 3.3*   Recent Labs  Lab 10/25/19 0742  LIPASE 20   No results for input(s): AMMONIA in the last 168 hours.  ABG No results found for: PHART, PCO2ART, PO2ART, HCO3, TCO2, ACIDBASEDEF, O2SAT   Coagulation Profile: No results for input(s): INR, PROTIME in the last 168 hours.  Cardiac Enzymes: No results for input(s): CKTOTAL, CKMB, CKMBINDEX, TROPONINI in the last 168 hours.  HbA1C: No results found for: HGBA1C  CBG: No results for input(s): GLUCAP in the last 168 hours.  Review of Systems:   Unable to obtain 2/2 patient mental  status  Past Medical History  She,  has a past medical history of Hydrocephalus (HCC), Seizures (HCC), and VP (ventriculoperitoneal) shunt status.   Surgical History    Past Surgical History:  Procedure Laterality Date  . CARDIAC SURGERY    . ESOPHAGOGASTRODUODENOSCOPY (EGD) WITH PROPOFOL N/A 10/25/2019   Procedure: ESOPHAGOGASTRODUODENOSCOPY (EGD) WITH PROPOFOL;  Surgeon: Meridee Score Netty Starring., MD;  Location[facility] ENDOSCOPY;  Service: Gastroenterology;  Laterality: N/A;  . VENTRICULOPERITONEAL SHUNT       Social History   reports that she has never smoked. She has never used smokeless tobacco. She reports that she does not drink alcohol or use drugs.   Family History   Her family history includes Diabetes in her father; Hypertension in her father and  mother.   Allergies No Active Allergies   Home Medications  Prior to Admission medications   Medication Sig Start Date End Date Taking? Authorizing Provider  acetaminophen (TYLENOL) 160 MG/5ML liquid Take 500 mg by mouth every 4 (four) hours as needed for fever.   Yes [provider]  diphenhydrAMINE (BENYLIN) 12.5 MG/5ML syrup Take 10 mLs (25 mg total) by mouth every 4 (four) hours as needed for allergies. 10/19/16  Yes Lyndal Pulley, MD  docusate (COLACE) 50 MG/5ML liquid Take 10 mLs (100 mg total) by mouth daily as needed for mild constipation or moderate constipation. 01/09/16  Yes Lavera Guise, MD  fluticasone [facility]) 50 MCG/ACT nasal spray Place 2 sprays into both nostrils daily. 06/25/15  Yes Kirichenko, Tatyana, PA-C  PHENobarbital (LUMINAL) 97.2 MG tablet Take 97.2 mg by mouth at bedtime.    Yes [provider]  polyethylene glycol (MIRALAX) packet Take 17 g by mouth daily. 01/06/13  Yes Schinlever, Santina Evans, PA-C  apixaban (ELIQUIS) 5 MG TABS tablet Take 2 tablets (10mg ) twice daily for 7 days, then 1 tablet (5mg ) twice daily 10/23/19   , DO  cetirizine (ZYRTEC) 1 MG/ML syrup Take 10 mLs (10 mg total) by mouth daily. Patient not taking: Reported on 10/13/2019 10/19/16   10/15/2019, MD  erythromycin ophthalmic ointment Place a 1/2 inch ribbon of ointment into the lower eyelid. Patient not taking: Reported on 10/13/2019 10/19/16   10/15/2019, MD  guaiFENesin (ROBITUSSIN) 100 MG/5ML liquid Take 5-10 mLs (100-200 mg total) by mouth every 4 (four) hours as needed for cough. Patient not taking: Reported on 10/13/2019 06/25/15   10/15/2019, PA-C  ibuprofen (ADVIL,MOTRIN) 100 MG/5ML suspension Take 20 mLs (400 mg total) by mouth every 6 (six) hours as needed. Patient not taking: Reported on 10/13/2019 10/19/16   10/15/2019, MD  sodium chloride (OCEAN) 0.65 % SOLN nasal spray Place 1 spray into both nostrils as needed for congestion. Patient  not taking: Reported on 01/09/2016 06/25/15   01/11/2016, PA-C     Critical care time: I have independently seen and examined the patient, reviewed data, and developed an assessment and plan. A total of 32 minutes were spent in critical care assessment and medical decision making. This critical care time does not reflect procedure time, or teaching time or supervisory time of PA/NP/Med student/Med Resident, etc but could involve care discussion time.  06/27/15, MD PhD 10/28/19  8:48 AM

## 2019-10-28 NOTE — Progress Notes (Signed)
PROGRESS NOTE  Tracey Graham QIO:[id number] DOB: 01/02/1986 DOA: 10/13/2019 PCP: Clinic, [facility]  HPI/Recap of past 24 hours: Tracey Graham is a 33 y.o. female with medical history significant for cerebral palsy, wheelchair-bound and fully dependent with ADLs, hydrocephalus s/p VP shunt, seizure disorder, and intellectual disability who presents to the ED for evaluation of fevers and abdominal pain.  Patient is unable to provide history herself therefore entirety of history is obtained from mother at bedside, EDP, and chart review.  Per mother, patient was in her usual state of health until she awoke the morning of 10/12/2019.  She was crying and appeared uncomfortable which is a new change for her.  She seemed to indicate that she has been having abdominal pain.  She has also been having fevers at home.  Mother thinks that she has been having some nausea without emesis.  She does have a history of dysphagia and does choke on food on occasion.  She has not had any cough.  She has not had any diarrhea.  Mother thinks that there is some decreased urinary frequency compared to normal.  She states the only medication she is taking his phenobarbital for history of seizures but has not had any seizures for many years.  ED Course:  Initial vitals showed BP 99/85, pulse 114, RR 13, temp 101.8 Fahrenheit, SPO2 95% on room air.  Labs are notable for WBC 11.4, hemoglobin 15.4, platelets 253,000, sodium 137, potassium 4.0, bicarb 22, BUN 5, creatinine 0.61, LFTs within normal limits, lactic acid 1.1.  Urinalysis was negative for UTI.  I-STAT beta-hCG <5.0.  Blood cultures were obtained and pending.  SARS-CoV-2 test was obtained and pending.  2 view chest x-ray was negative for focal consolidation, effusion, or infiltrate.  CT abdomen/pelvis without contrast showed trace lower abdominal pelvic free fluid per radiology may be physiologic or related to chronic VP shunt tubing.  No fluid  collection, abscess, or acute intra-abdominal or pelvic finding seen.  VP shunt series showed visualized VP shunt catheter intact on skull, chest, and abdominal x-rays.  CT head without contrast showed a left frontal approach CSF shunt which appeared stable along with decompressed ventricles since 2014.  No acute intracranial abnormality noted.  Patient was given multiple rounds of IV morphine for pain and 1 L normal saline.  EDP discussed the case with neurosurgery who did not feel tap of the VP shunt was indicated and that it would be unlikely to cause abdominal pain.  -In emergency room was febrile to 101.8, white count was 11.3, rest of her electrolytes were unremarkable, urinalysis was negative, chest x-ray was unremarkable, COVID-19 PCR was negative -CT abdomen/pelvis without contrast showed trace lower abdominal pelvic free fluid per radiology may be physiologic or related to chronic VP shunt tubing. No fluid collection, abscess, or acute intra-abdominal or pelvic finding seen. -VP shunt series showed visualized VP shunt catheter intact on skull, chest, and abdominal x-rays. CT head without contrast showed a left frontal approach CSF shunt which appeared stable along with decompressed ventricles since 2014. No acute intracranial abnormality noted. -Subsequent work-up noted left leg DVT, started anticoagulation, SQ full dose lovenox BID. -slow improvement but low grade fevers persist. LP done to r/o meningitis. LP fluid analysis showed pleocytosis, elevated protein and lymphocytic predominance with no bacteria seen on Gram stain. HSV PCR and enterovirus PCR negative.  On 10/19/2019 started on p.o. Decadron 4 mg every 6 hours x3 days by infectious disease and Tylenol as needed. No NSAIDs due  to anticoagulation on full dose subcu Lovenox.  Hospital course complicated by worsening abdominal pain for which GI was consulted and EGD was completed on 10/25/2019 by Dr. Meridee Score.  Findings  include esophagitis and gastric ulcers.  Started on PPI twice daily with plan to follow-up in 3 months.  No biopsies taken due to being on Eliquis.  H. pylori serology negative.    Due to no improvement in symptomatology infectious disease was re-consulted on 10/26/2019, Unasyn started on 10/26/19 by ID.  Ordered CTA abdomen and pelvis, CT chest with contrast which showed findings concerning for multifocal pneumonia and 5.6 cm x 2.5 cm loculated appearing fluid collection adjacent to the tip of the VP shunt in the left anterior pelvis.   Discussed the case with Dr. Orvan Falconer infectious disease, recommendation for neurosurgery involvement due to suspected VP shunt infection.  Seen by neurosurgery Dr. Yetta Barre.  Due to worsening clinical picture and concern for decompensation patient was transferred to ICU to continue work-up/treatment and close monitoring.  10/28/19: Patient was seen and examined at her bedside this morning.  Her mother was not present at the time of this visit.  Patient is asleep, on wrist restraints.  O2 saturation 97% on 5 L.  Discussed the case with Dr. Yetta Barre, neurosurgery, doubts that her presentation is primarily due to VP shunt infection.  Recommends LP at this time.  We will stop Eliquis and start heparin drip.  If signs of infection on LP neurosurgery may consider externalization of VP shunt.  Diagnostic radiology consulted for possible LP, CSF fluid will be sent for analysis.    Assessment/Plan: Principal Problem:   Infection of VP (ventriculoperitoneal) shunt (HCC) Active Problems:   Hydrocephalus (HCC)   VP (ventriculoperitoneal) shunt status   Encephalopathy   Abdominal pain   Nuchal rigidity   Aseptic meningitis   Constipation   Deep vein thrombosis (DVT) of femoral vein of left lower extremity (HCC)  Recurrent fever, unclear etiology, possibly from multifocal pneumonia versus VP shunt infection versus others.  T-max 103.1 overnight Last procalcitonin 5.22 on  10/26/2019. Repeat blood cultures negative to date Sputum culture pending Diagnostic radiology consulted for possible LP.  CSF will be sent for analysis. CT abdomen and pelvis with contrast done on 10/26/2019 independently reviewed showed loculated fluid near the tip of the VP shunt in the left anterior pelvis CT chest with contrast independently viewed showed bilateral pulmonary infiltrates Currently on broad-spectrum antibiotics, IV vancomycin, IV cefepime, IV Flagyl, IV azithromycin. Continue to follow cultures  Sepsis of unclear etiology, suspect multifactorial Management as stated above  Suspected VP shunt infection in the setting of Hx of hydrocephalus s/p chronic VP shunt  CT head without contrast on 10/13/2019 showed a left frontal approach CSF shunt which appeared stable along with decompressed ventricles since 2014.  CT abdomen and pelvis with contrast done on 10/26/2019 with findings as stated above. Seen by neurosurgery, following. LP first as stated above, if positive then possible neurosurgical procedure with externalization of VP shunt DC Eliquis, start heparin drip  Multifocal pneumonia Independently viewed chest x-ray done on 10/27/2019 which showed bilateral pulmonary infiltrates worse at bases  On broad-spectrum IV antibiotics as stated above Ordered bronchodilators Atrovent 2 puffs 3 times daily and Xopenex 2 puffs 3 times daily Currently requiring at least 4 L of oxygen by nasal cannula Maintain O2 saturation greater than 92% PCCM consulted and following Sputum culture pending  Persistent sinus tachycardia Likely secondary to fever versus intravascular volume depletion versus others Started p.o.  Lopressor 12.5 mg twice daily IV Lopressor as needed with parameters Maintain map greater than 65 Gentle IV fluid hydration due to minimal to no oral intake in the setting of acute illness  Acute blood loss anemia possibly secondary to hematuria of unclear etiology UA  done on 10/27/2019 showed large hemoglobin and RBC greater than 50 Hemoglobin appears to have stabilized today 9.4 on 10/28/2019 from 9.4 on 10/27/2019.  MCV 79. Iron studies ordered and pending  Watery stools Noted in the last 48 to 72 hours WBC is normalizing down to 10.1 Possibly associated with antibiotics Continue to closely monitor and obtain GI panel Monitor electrolytes  Hypokalemia Potassium 3.2 Repleted with IV KCl Added 2 g IV magnesium once Obtain magnesium level  Intractable abdominal pain suspect multifactorial secondary to possible VP shunt infection versus esophagitis versus nonbleeding gastric ulcers post EGD 10/25/2019 by Dr. Rush Landmark. Continue p.o. PPI twice daily as recommended by GI Continue symptomatic management Carafate 4 times daily and IV morphine as needed for severe pain  Presumed aseptic meningitis Cultures and HSV, ENTEROVIRUS PCR negative Completed 3 days of Decadron on 10/21/2019 Repeat LP, diagnostic radiology consulted  Presumed Lovenox induced fevers  Was switched to Eliquis Now on heparin drip due to possible procedures  Acute left lower extremity DVT. Continue anticoagulation with heparin drip for now  Seizure disorder Continue AED Switch to IV if unable to take oral AED  Dysphagia -Given cerebral palsy, aspiration precautions  Resolved chronic constipation: Had multiple bowel movements yesterday 10/27/2019.   DVT prophylaxis:Heparin drip Code Status:Full code, confirmed with mother Family Communication: Updated her mother in person on 10/27/2019.  Disposition Plan:Patient is currently not appropriate for discharge due to ongoing work-up and treatment for multifocal pneumonia and suspected VP shunt infection.  Consultants:   Infectious disease  Interventional radiology  GI  Neurosurgery consulted on 10/27/2019   Procedures:   Lumbar puncture by interventional radiology on 10/17/2019. EGD  10/25/2019.  Objective: Vitals:   10/28/19 0700 10/28/19 0800 10/28/19 0836 10/28/19 0900  BP: 116/72 134/74  121/83  Pulse:  (!) 103 (!) 104 (!) 111  Resp: (!) 34 (!) 28 (!) 40 (!) 38  Temp:  99.4 F (37.4 C)    TempSrc:  Axillary    SpO2:  94% 97% 94%  Weight:      Height:        Intake/Output Summary (Last 24 hours) at 10/28/2019 5809 Last data filed at 10/28/2019 0600 Gross per 24 hour  Intake 832.75 ml  Output 550 ml  Net 282.75 ml   Filed Weights   10/13/19 1003 10/13/19 2006 10/14/19 0237  Weight: 72.6 kg 74.4 kg 74.7 kg    Exam:   General: 33 y.o. year-old female well-developed well-nourished in no acute distress.  Somnolent.    Cardiovascular: Tachycardic with no rubs or gallops.    Respiratory: Mild rales at bases no wheezing noted.  Poor inspiratory effort.  Abdomen:Distended.  Hypoactive bowel sounds.    Musculoskeletal: Trace lower extremity edema bilaterally.  Psychiatry: Unable to assess mood due to somnolence.   Data Reviewed: CBC: Recent Labs  Lab 10/22/19 1331 10/25/19 0742 10/26/19 0640 10/27/19 0340 10/28/19 0118  WBC 13.9* 20.4* 18.9* 11.5* 10.1  NEUTROABS  --  17.4* 15.5* 9.1*  --   HGB 13.7 12.4 11.8* 9.4* 9.4*  HCT 37.9 34.3* 32.6* 25.9* 25.8*  MCV 79.3* 79.0* 80.1 79.4* 79.6*  PLT 264 225 325 286 983   Basic Metabolic Panel: Recent Labs  Lab 10/25/19  4098 10/27/19 0340 10/28/19 0118  NA 136 139 137  K 4.0 3.1* 3.2*  CL 102 105 105  CO2 GLUCOSE 115* 136* 146*  BUN 6 <5* 6  CREATININE 0.52 0.44 0.49  CALCIUM 8.5* 8.2* 8.4*   GFR: Estimated Creatinine Clearance: 99 mL/min (by C-G formula based on SCr of 0.49 mg/dL). Liver Function Tests: Recent Labs  Lab 10/24/19 0341 10/25/19 0742 10/27/19 0340 10/28/19 0118  AST 16 17 14* 22  ALT 37 ALKPHOS 82 78 50 52  BILITOT 0.5 0.8 1.1 1.1  PROT 6.8 6.2* 6.1* 6.0*  ALBUMIN 3.1* 2.7* 3.7 3.3*   Recent Labs  Lab 10/25/19 0742  LIPASE 20   No  results for input(s): AMMONIA in the last 168 hours. Coagulation Profile: No results for input(s): INR, PROTIME in the last 168 hours. Cardiac Enzymes: No results for input(s): CKTOTAL, CKMB, CKMBINDEX, TROPONINI in the last 168 hours. BNP (last 3 results) No results for input(s): PROBNP in the last 8760 hours. HbA1C: No results for input(s): HGBA1C in the last 72 hours. CBG: No results for input(s): GLUCAP in the last 168 hours. Lipid Profile: No results for input(s): CHOL, HDL, LDLCALC, TRIG, CHOLHDL, LDLDIRECT in the last 72 hours. Thyroid Function Tests: No results for input(s): TSH, T4TOTAL, FREET4, T3FREE, THYROIDAB in the last 72 hours. Anemia Panel: No results for input(s): VITAMINB12, FOLATE, FERRITIN, TIBC, IRON, RETICCTPCT in the last 72 hours. Urine analysis:    Component Value Date/Time   COLORURINE AMBER (A) 10/27/2019 1429   APPEARANCEUR HAZY (A) 10/27/2019 1429   LABSPEC 1.036 (H) 10/27/2019 1429   PHURINE 5.0 10/27/2019 1429   GLUCOSEU NEGATIVE 10/27/2019 1429   HGBUR LARGE (A) 10/27/2019 1429   BILIRUBINUR NEGATIVE 10/27/2019 1429   KETONESUR NEGATIVE 10/27/2019 1429   PROTEINUR 100 (A) 10/27/2019 1429   UROBILINOGEN 1.0 01/05/2013 2116   NITRITE NEGATIVE 10/27/2019 1429   LEUKOCYTESUR NEGATIVE 10/27/2019 1429   Sepsis Labs: (procalcitonin:4,lacticidven:4)  ) Recent Results (from the past 240 hour(s))  Culture, blood (routine x 2)     Status: None   Collection Time: 10/19/19 10:12 AM   Specimen: BLOOD LEFT HAND  Result Value Ref Range Status   Specimen Description BLOOD LEFT HAND  Final   Special Requests   Final    BOTTLES DRAWN AEROBIC ONLY Blood Culture adequate volume   Culture   Final    NO GROWTH 5 DAYS Performed at [facility], [address] [place], [place] [address]    Report Status 10/24/2019 FINAL  Final  Culture, blood (routine x 2)     Status: None   Collection Time: 10/19/19 10:24 AM   Specimen: BLOOD LEFT HAND   Result Value Ref Range Status   Specimen Description BLOOD LEFT HAND  Final   Special Requests   Final    BOTTLES DRAWN AEROBIC ONLY Blood Culture adequate volume   Culture   Final    NO GROWTH 5 DAYS Performed at [facility], [address], [place], [place] [address]    Report Status 10/24/2019 FINAL  Final  Culture, blood (routine x 2)     Status: None (Preliminary result)   Collection Time: 10/25/19  7:34 AM   Specimen: BLOOD LEFT HAND  Result Value Ref Range Status   Specimen Description BLOOD LEFT HAND  Final   Special Requests   Final    BOTTLES DRAWN AEROBIC ONLY Blood Culture results may not  be optimal due to an inadequate volume of blood received in culture bottles   Culture   Final    NO GROWTH 3 DAYS Performed at [facility], [address], [place], [place] [address]    Report Status PENDING  Incomplete  Culture, blood (routine x 2)     Status: None (Preliminary result)   Collection Time: 10/25/19  7:25 PM   Specimen: Site Not Specified; Blood  Result Value Ref Range Status   Specimen Description SITE NOT SPECIFIED  Final   Special Requests   Final    BOTTLES DRAWN AEROBIC AND ANAEROBIC Blood Culture adequate volume   Culture   Final    NO GROWTH 3 DAYS Performed at [facility] [place] [facility], [address], [place], [place] [address]    Report Status PENDING  Incomplete  Culture, blood (routine x 2)     Status: None (Preliminary result)   Collection Time: 10/27/19 11:15 PM   Specimen: BLOOD RIGHT HAND  Result Value Ref Range Status   Specimen Description BLOOD RIGHT HAND  Final   Special Requests   Final    AEROBIC BOTTLE ONLY Blood Culture results may not be optimal due to an inadequate volume of blood received in culture bottles   Culture   Final    NO GROWTH < 12 HOURS Performed at [facility], [address] [place], [place] [address]    Report Status PENDING  Incomplete  Culture, blood (routine x 2)     Status: None (Preliminary result)    Collection Time: 10/27/19 11:20 PM   Specimen: BLOOD LEFT HAND  Result Value Ref Range Status   Specimen Description BLOOD LEFT HAND  Final   Special Requests AEROBIC BOTTLE ONLY Blood Culture adequate volume  Final   Culture   Final    NO GROWTH < 12 HOURS Performed at [facility], [address], [place], [place] [address]    Report Status PENDING  Incomplete      Studies: Ct Head Wo Contrast  Result Date: 10/27/2019 CLINICAL DATA:  Multilevel constant. Follow-up for VP shunt. Hydrocephalus. Possible infection. EXAM: CT HEAD WITHOUT CONTRAST TECHNIQUE: Contiguous axial images were obtained from the base of the skull through the vertex without intravenous contrast. COMPARISON:  CT head without contrast 10/13/2019 FINDINGS: Brain: Left frontal approach ventricular shunt is stable. Ventricles are collapsed. 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 Calcifications are present within the left occipital lobe. Chronic calcifications are present in the left occipital lobe. No significant extra-axial collection is present. Shunt position is stable Vascular: No hyperdense vessel or unexpected calcification. Skull: Left frontal burr hole is present. Calvarium is otherwise within normal limits. Sinuses/Orbits: The paranasal sinuses and mastoid air cells are clear. The globes and orbits are within normal limits. IMPRESSION: 1. Stable appearance of VP shunt via a left frontal approach. 2. No hydrocephalus or significant extra-axial fluid collection. 3. Stable chronic findings. Electronically Signed   By: Marin Roberts M.D.   On: 10/27/2019 15:10   Dg Chest Port 1 View  Result Date: 10/27/2019 CLINICAL DATA:  Central line placement. EXAM: PORTABLE CHEST 1 VIEW COMPARISON:  Prior today FINDINGS: A new right jugular central venous catheter is seen with tip overlying the inferior aspect of the right atrium. A left arm PICC line remains in appropriate position. No evidence of  pneumothorax. Shunt tubing is again seen overlying the left hemithorax. Low lung volumes are again seen. Bilateral lower lung  airspace disease shows no significant change. Heart size remains within normal limits. IMPRESSION: New right jugular central venous catheter tip overlies the inferior aspect of the right atrium. No evidence of pneumothorax. Stable bilateral lower lung airspace disease. Electronically Signed   By: Danae Orleans M.D.   On: 10/27/2019 22:10   Dg Chest Port 1 View  Result Date: 10/27/2019 CLINICAL DATA:  Hypoxia. EXAM: PORTABLE CHEST 1 VIEW COMPARISON:  Earlier film, same date. FINDINGS: The cardiac silhouette, mediastinal and hilar contours are stable. The left PICC line is stable. Persistent and slight worsening bibasilar infiltrates. No definite pleural effusions. IMPRESSION: Progressive bibasilar infiltrates. Electronically Signed   By: Rudie Meyer M.D.   On: 10/27/2019 17:38    Scheduled Meds:  acetaminophen  650 mg Oral Q6H   Chlorhexidine Gluconate Cloth  6 each Topical Daily   dextromethorphan-guaiFENesin  2 tablet Oral BID   dicyclomine  10 mg Oral TID AC & HS   ipratropium-albuterol  3 mL Nebulization BID   metoprolol tartrate  12.5 mg Oral BID    morphine injection  1 mg Intravenous Once   pantoprazole (PROTONIX) IV  40 mg Intravenous Q12H   phenobarbital  97.2 mg Oral QHS   sucralfate  1 g Oral TID WC & HS    Continuous Infusions:  azithromycin Stopped (10/28/19 0509)   ceFEPime (MAXIPIME) IV Stopped (10/28/19 0359)   diltiazem (CARDIZEM) infusion 10 mg/hr (10/27/19 1900)   heparin     metronidazole 100 mL/hr at 10/28/19 0400   vancomycin 1,000 mg (10/28/19 0908)     LOS: 14 days     Darlin Drop, MD [facility] Pager [phone]  If 7PM-7AM, please contact night-coverage [email or web address] Password [facility] 10/28/2019, 9:23 AM

## 2019-10-28 NOTE — Progress Notes (Signed)
ANTICOAGULATION CONSULT NOTE  Pharmacy Consult :  Heparin Indication: DVT  No Active Allergies  Patient Measurements: Height: 5\' 4"  (162.6 cm) Weight: 164 lb 10.9 oz (74.7 kg) IBW/kg (Calculated) : 54.7 Heparin Dosing Weight: 70kg  Vital Signs: Temp: 99.9 F (37.7 C) (11/01 1600) Temp Source: Oral (11/01 1600) BP: 146/98 (11/01 1800) Pulse Rate: 110 (11/01 1800)  Labs: Recent Labs    10/26/19 0640 10/27/19 0340 10/28/19 0118 10/28/19 1835  HGB 11.8* 9.4* 9.4*  --   HCT 32.6* 25.9* 25.8*  --   PLT 325 286 269  --   APTT  --   --   --  96*  CREATININE  --  0.44 0.49  --     Estimated Creatinine Clearance: 99 mL/min (by C-G formula based on SCr of 0.49 mg/dL).   Assessment: 73 YOF with acute DVT started on lovenox and transitioned to Eliquis.  Now with possible VP shunt infection and transitioned to heparin for procedures.  Last Eliquis administered 10/31 at 2250, will follow aPTT until HL correlated.    APTT therapeutic; no bleeding reported.  Goal of Therapy:  Heparin level 0.3-0.7 units/ml aPTT 66-102 seconds Monitor platelets by anticoagulation protocol: Yes   Plan:  Reduce heparin gtt slightly to 1100 units/hr F/U AM labs  Tracey Graham D. [name], PharmD, BCPS, [place] 10/28/2019, 7:21 PM

## 2019-10-28 NOTE — Progress Notes (Signed)
Chart reviewed and events noted.  I do not believe her current situation is related to the small pseudocyst at the tip of the catheter.  No change in recommendations from yesterday.  Dr. Zada Finders will assume neurosurgical care tomorrow.  CT scan looks stable with no development of hydrocephalus.  Once there is no anticoagulation she will probably need shunt tap though I think getting CSF may be difficult.  If difficult to get that she may be a candidate for repeat lumbar puncture.  If she proves to have an infected shunt then it would need to be externalized and treated with antibiotics and then replaced.

## 2019-10-29 ENCOUNTER — Encounter: Payer: Self-pay | Admitting: *Deleted

## 2019-10-29 ENCOUNTER — Inpatient Hospital Stay (HOSPITAL_COMMUNITY): Payer: Medicaid Other

## 2019-10-29 DIAGNOSIS — R109 Unspecified abdominal pain: Secondary | ICD-10-CM

## 2019-10-29 DIAGNOSIS — T85730D Infection and inflammatory reaction due to ventricular intracranial (communicating) shunt, subsequent encounter: Secondary | ICD-10-CM

## 2019-10-29 DIAGNOSIS — D72829 Elevated white blood cell count, unspecified: Secondary | ICD-10-CM

## 2019-10-29 LAB — CBC
HCT: 27.7 % — ABNORMAL LOW (ref 36.0–46.0)
Hemoglobin: 9.9 g/dL — ABNORMAL LOW (ref 12.0–15.0)
MCH: 28.5 pg (ref 26.0–34.0)
MCHC: 35.7 g/dL (ref 30.0–36.0)
MCV: 79.8 fL — ABNORMAL LOW (ref 80.0–100.0)
Platelets: 339 10*3/uL (ref 150–400)
RBC: 3.47 MIL/uL — ABNORMAL LOW (ref 3.87–5.11)
RDW: 13.6 % (ref 11.5–15.5)
WBC: 16.6 10*3/uL — ABNORMAL HIGH (ref 4.0–10.5)
nRBC: 0 % (ref 0.0–0.2)

## 2019-10-29 LAB — BASIC METABOLIC PANEL
Anion gap: 10 (ref 5–15)
BUN: 5 mg/dL — ABNORMAL LOW (ref 6–20)
CO2: 21 mmol/L — ABNORMAL LOW (ref 22–32)
Calcium: 8.2 mg/dL — ABNORMAL LOW (ref 8.9–10.3)
Chloride: 107 mmol/L (ref 98–111)
Creatinine, Ser: 0.57 mg/dL (ref 0.44–1.00)
GFR calc Af Amer: >60 mL/min (ref >60)
GFR calc non Af Amer: >60 mL/min (ref >60)
Glucose, Bld: 131 mg/dL — ABNORMAL HIGH (ref 70–99)
Potassium: 3.7 mmol/L (ref 3.5–5.1)
Sodium: 138 mmol/L (ref 135–145)

## 2019-10-29 LAB — PROTEIN AND GLUCOSE, CSF
Glucose, CSF: 44 mg/dL (ref 40–70)
Total  Protein, CSF: 141 mg/dL — ABNORMAL HIGH (ref 15–45)

## 2019-10-29 LAB — FOLATE RBC
Folate, Hemolysate: 249 ng/mL
Folate, RBC: 865 ng/mL (ref >498)
Hematocrit: 28.8 % — ABNORMAL LOW (ref 34.0–46.6)

## 2019-10-29 LAB — HEPARIN LEVEL (UNFRACTIONATED): Heparin Unfractionated: 1.2 IU/mL — ABNORMAL HIGH (ref 0.30–0.70)

## 2019-10-29 LAB — APTT: aPTT: 81 seconds — ABNORMAL HIGH (ref 24–36)

## 2019-10-29 MED ORDER — LABETALOL HCL 5 MG/ML IV SOLN
5.0000 mg | INTRAVENOUS | Status: DC | PRN
  Administered 2019-10-29 (×2): 5 mg via INTRAVENOUS
  Filled 2019-10-29 (×2): qty 4

## 2019-10-29 MED ORDER — METOPROLOL TARTRATE 25 MG PO TABS: 12.5000 mg | ORAL_TABLET | Freq: Once | ORAL | Status: DC

## 2019-10-29 MED ORDER — METOPROLOL TARTRATE 12.5 MG HALF TABLET
12.5000 mg | ORAL_TABLET | Freq: Three times a day (TID) | ORAL | Status: DC
  Administered 2019-10-29 – 2019-11-12 (×38): 12.5 mg via ORAL
  Filled 2019-10-29 (×41): qty 1

## 2019-10-29 MED ORDER — ACETAMINOPHEN 500 MG PO TABS
500.0000 mg | ORAL_TABLET | Freq: Four times a day (QID) | ORAL | Status: DC
  Filled 2019-10-29 (×2): qty 1

## 2019-10-29 MED ORDER — FERROUS SULFATE 325 (65 FE) MG PO TABS
325.0000 mg | ORAL_TABLET | Freq: Every day | ORAL | Status: DC
  Administered 2019-10-29 – 2019-11-08 (×6): 325 mg via ORAL
  Filled 2019-10-29 (×8): qty 1

## 2019-10-29 MED ORDER — METOPROLOL TARTRATE 25 MG PO TABS: 25.0000 mg | ORAL_TABLET | Freq: Two times a day (BID) | ORAL | Status: DC

## 2019-10-29 MED FILL — Dextrose Inj 5%: INTRAVENOUS | Qty: 100 | Status: AC

## 2019-10-29 MED FILL — Diltiazem HCl IV Soln 25 MG/5ML (5 MG/ML): INTRAVENOUS | Qty: 25 | Status: AC

## 2019-10-29 NOTE — Progress Notes (Addendum)
Neurosurgery Service Progress Note  Subjective: No acute events overnight   Objective: Vitals:   10/29/19 0300 10/29/19 0400 10/29/19 0500 10/29/19 0600  BP: (!) 154/116 (!) 151/104 (!) 163/116 (!) 154/108  Pulse: (!) 108 (!) 104 (!) 104 (!) 106  Resp: (!) 36 (!) 34 (!) 33 (!) 33  Temp:      TempSrc:      SpO2: 98% 99% 96% 94%  Weight:      Height:       Temp (24hrs), Avg:99.9 F (37.7 C), Min:98.8 F (37.1 C), Max:101.4 F (38.6 C)  CBC Latest Ref Rng & Units 10/29/2019 10/28/2019 10/27/2019  WBC 4.0 - 10.5 K/uL PENDING 10.1 11.5(H)  Hemoglobin 12.0 - 15.0 g/dL 4.0(J9.9(L) 8.1(X9.4(L) 9.1(Y9.4(L)  Hematocrit 36.0 - 46.0 % 27.7(L) 25.8(L) 25.9(L)  Platelets 150 - 400 K/uL 339 269 286   BMP Latest Ref Rng & Units 10/29/2019 10/28/2019 10/28/2019  Glucose 70 - 99 mg/dL 782(N131(H) - 562(Z146(H)  BUN 6 - 20 mg/dL 5(L) - 6  Creatinine 3.080.44 - 1.00 mg/dL 6.570.57 - 8.460.49  Sodium 962135 - 145 mmol/L 138 - 137  Potassium 3.5 - 5.1 mmol/L 3.7 3.9 3.2(L)  Chloride 98 - 111 mmol/L 107 - 105  CO2 22 - 32 mmol/L 21(L) - 22  Calcium 8.9 - 10.3 mg/dL 8.2(L) - 8.4(L)    Intake/Output Summary (Last 24 hours) at 10/29/2019 0754 Last data filed at 10/29/2019 0600 Gross per 24 hour  Intake 2543.5 ml  Output 400 ml  Net 2143.5 ml    Current Facility-Administered Medications:  .  acetaminophen (TYLENOL) suppository 650 mg, 650 mg, Rectal, Q8H PRN, Blount, Xenia T, NP, 650 mg at 10/28/19 0315 .  acetaminophen (TYLENOL) tablet 650 mg, 650 mg, Oral, Q6H PRN, Norins, Rosalyn GessMichael E, MD, 650 mg at 10/26/19 1314 .  azithromycin (ZITHROMAX) 500 mg in sodium chloride 0.9 % 250 mL IVPB, 500 mg, Intravenous, Q24H, Blount, Xenia T, NP, Last Rate: 250 mL/hr at 10/29/19 0646, 500 mg at 10/29/19 0646 .  bisacodyl (DULCOLAX) suppository 10 mg, 10 mg, Rectal, Daily PRN, Dow AdolphHall, Carole N, DO, 10 mg at 10/23/19 0418 .  ceFEPIme (MAXIPIME) 2 g in sodium chloride 0.9 % 100 mL IVPB, 2 g, Intravenous, Q8H, Gerilyn NestleDang, Thuy D, RPH, Stopped at 10/29/19 0341 .   Chlorhexidine Gluconate Cloth 2 % PADS 6 each, 6 each, Topical, Daily, Dow AdolphHall, Carole N, DO, 6 each at 10/28/19 0900 .  dextromethorphan-guaiFENesin (MUCINEX DM) 30-600 MG per 12 hr tablet 2 tablet, 2 tablet, Oral, BID, Darlin DropHall, Carole N, DO, 2 tablet at 10/28/19 2234 .  dextrose 5 %-0.9 % sodium chloride infusion, , Intravenous, Continuous, Hall, Carole N, DO, Last Rate: 50 mL/hr at 10/29/19 0600 .  dicyclomine (BENTYL) capsule 10 mg, 10 mg, Oral, TID AC & HS, Dianah FieldGribbin, Sarah J, PA-C, 10 mg at 10/29/19 0646 .  diltiazem (CARDIZEM) 125 mg in dextrose 5% 125 mL (1 mg/mL) infusion, 5-15 mg/hr, Intravenous, Titrated, Hall, Carole N, DO, Last Rate: 10 mL/hr at 10/27/19 1900, 10 mg/hr at 10/27/19 1900 .  diphenhydrAMINE (BENADRYL) capsule 25 mg, 25 mg, Oral, Q6H PRN, Hall, Carole N, DO, 25 mg at 10/27/19 1200 .  ferrous sulfate tablet 325 mg, 325 mg, Oral, Q breakfast, Hall, Carole N, DO .  heparin ADULT infusion 100 units/mL (25000 units/22250mL sodium chloride 0.45%), 1,100 Units/hr, Intravenous, Continuous, Dang, Thuy D, RPH, Last Rate: 11 mL/hr at 10/29/19 0600, 1,100 Units/hr at 10/29/19 0600 .  ipratropium-albuterol (DUONEB) 0.5-2.5 (3) MG/3ML nebulizer solution  3 mL, 3 mL, Nebulization, BID, Hall, Carole N, DO, 3 mL at 10/28/19 2004 .  labetalol (NORMODYNE) injection 5 mg, 5 mg, Intravenous, Q2H PRN, Hall, Carole N, DO .  levalbuterol (XOPENEX HFA) inhaler 1 puff, 1 puff, Inhalation, Q8H PRN, Hall, Carole N, DO .  lidocaine (XYLOCAINE) 1 % (with pres) injection, , Infiltration, PRN, Bruning, Kevin, PA-C, 5 mL at 10/25/19 1236 .  metoprolol tartrate (LOPRESSOR) tablet 12.5 mg, 12.5 mg, Oral, BID, [name] Pap N, DO, 12.5 mg at 10/28/19 2232 .  metroNIDAZOLE (FLAGYL) IVPB 500 mg, 500 mg, Intravenous, Q8H, Michel Bickers, MD, Stopped at 10/29/19 [id number] .  morphine 2 MG/ML injection 1 mg, 1 mg, Intravenous, Q4H PRN, Hall, Carole N, DO, 1 mg at 10/28/19 1541 .  morphine 2 MG/ML injection 1 mg, 1 mg, Intravenous,  Once, [name], Belinda K, NP .  ondansetron (ZOFRAN) tablet 4 mg, 4 mg, Oral, Q6H PRN **OR** ondansetron (ZOFRAN) injection 4 mg, 4 mg, Intravenous, Q6H PRN, Lenore Cordia, MD, 4 mg at 10/25/19 1937 .  pantoprazole (PROTONIX) injection 40 mg, 40 mg, Intravenous, Q12H, Kayleen Memos, DO, 40 mg at 10/28/19 2229 .  PHENobarbital (LUMINAL) tablet 97.2 mg, 97.2 mg, Oral, QHS, Truett Mainland, DO, 97.2 mg at 10/28/19 2232 .  polyethylene glycol (MIRALAX / GLYCOLAX) packet 17 g, 17 g, Oral, Daily PRN, Hall, Carole N, DO .  sodium chloride flush (NS) 0.9 % injection 10-40 mL, 10-40 mL, Intracatheter, PRN, Amin, Ankit Chirag, MD .  sucralfate (CARAFATE) 1 GM/10ML suspension 1 g, 1 g, Oral, TID WC & HS, Hall, Carole N, DO, 1 g at 10/28/19 2236 .  vancomycin (VANCOCIN) IVPB 1000 mg/200 mL premix, 1,000 mg, Intravenous, Q12H, Tyrone Apple, RPH, Stopped at 10/28/19 2229   Physical Exam: Awake/alert, answers yes/no to some questions, spastic quadriparesis Abdomen appears mildly distended, hypertympanic, but non-tender  Assessment & Plan: 33 y.o. woman w/ h/o MRCP, shunted hydrocephalus, admitted for abdominal pain and fevers, prolonged course w/ LP w/ pleocytosis w/o positive culture, DVT, then RLL PNA. Initial CT A/P showed normal trace fluid 2/2 CSF drainage, but repeat CT A/P showed possible catheter tip pseudocyst with some reactive changes.  -I agree w/ Dr. Ronnald Ramp, given her very atypical catheter location / CSF space anatomy, I do not think that tapping her shunt is going to yield much CSF and therefore recommend LP to sample CSF.  -depending on LP results, may require externalization then distal revision, but since the shunt appears to be functioning, we may be able to treat through this. It is difficult to tell what CSF space her proximal catheter is draining, possibly interhemispheric fissure, we also do not know what her intracranial compliance is. Because of that, if her shunt is working, we should  try everything possible to prevent revision of the system, especially the proximal catheter. If, for some reason, the old tract is not accessible then it would require replacement into the interhemispheric fissure, as she has essentially no ventricles and appears to chronically drain from the interhemispheric fissure. This space contains the ACAs and, in this patient, is only roughly 5-36mm wide and the catheter is 2.5-52mm in diameter.  Addendum: spoke with the patient's mother. The patient has had one shunt revision that was 20+ years ago. At that time, the patient had classic shunt failure Sx of progressive somnolence / obtundation / headache nausea / vomiting. This likely increases the likelihood that she is no longer shunt dependent.  [place]  10/29/19 7:54 AM

## 2019-10-29 NOTE — Progress Notes (Signed)
PROGRESS NOTE  Emmersen Garraway NOB:[id number] DOB: [birth year] DOA: 10/13/2019 PCP: [facility]  HPI/Recap of past 24 hours: Tracey Graham is a 33 y.o. female with medical history significant for cerebral palsy, wheelchair-bound and fully dependent with ADLs, hydrocephalus s/p VP shunt, seizure disorder, and intellectual disability who presents to the ED for evaluation of fevers and abdominal pain.  Patient is unable to provide history herself therefore entirety of history is obtained from mother at bedside, EDP, and chart review.  Per mother, patient was in her usual state of health until she awoke the morning of 10/12/2019.  She was crying and appeared uncomfortable which is a new change for her.  She seemed to indicate that she has been having abdominal pain.  She has also been having fevers at home.  Mother thinks that she has been having some nausea without emesis.  She does have a history of dysphagia and does choke on food on occasion.  She has not had any cough.  She has not had any diarrhea.  Mother thinks that there is some decreased urinary frequency compared to normal.  She states the only medication she is taking his phenobarbital for history of seizures but has not had any seizures for many years.  ED Course:  Initial vitals showed BP 99/85, pulse 114, RR 13, temp 101.8 Fahrenheit, SPO2 95% on room air.  Labs are notable for WBC 11.4, hemoglobin 15.4, platelets 253,000, sodium 137, potassium 4.0, bicarb 22, BUN 5, creatinine 0.61, LFTs within normal limits, lactic acid 1.1.  Urinalysis was negative for UTI.  I-STAT beta-hCG <5.0.  Blood cultures were obtained and pending.  SARS-CoV-2 test was obtained and pending.  2 view chest x-ray was negative for focal consolidation, effusion, or infiltrate.  CT abdomen/pelvis without contrast showed trace lower abdominal pelvic free fluid per radiology may be physiologic or related to chronic VP shunt tubing.  No fluid  collection, abscess, or acute intra-abdominal or pelvic finding seen.  VP shunt series showed visualized VP shunt catheter intact on skull, chest, and abdominal x-rays.  CT head without contrast showed a left frontal approach CSF shunt which appeared stable along with decompressed ventricles since 2014.  No acute intracranial abnormality noted.  Patient was given multiple rounds of IV morphine for pain and 1 L normal saline.  EDP discussed the case with neurosurgery who did not feel tap of the VP shunt was indicated and that it would be unlikely to cause abdominal pain.  -In emergency room was febrile to 101.8, white count was 11.3, rest of her electrolytes were unremarkable, urinalysis was negative, chest x-ray was unremarkable, COVID-19 PCR was negative -CT abdomen/pelvis without contrast showed trace lower abdominal pelvic free fluid per radiology may be physiologic or related to chronic VP shunt tubing. No fluid collection, abscess, or acute intra-abdominal or pelvic finding seen. -VP shunt series showed visualized VP shunt catheter intact on skull, chest, and abdominal x-rays. CT head without contrast showed a left frontal approach CSF shunt which appeared stable along with decompressed ventricles since 2014. No acute intracranial abnormality noted. -Subsequent work-up noted left leg DVT, started anticoagulation, SQ full dose lovenox BID. -slow improvement but low grade fevers persist. LP done to r/o meningitis. LP fluid analysis showed pleocytosis, elevated protein and lymphocytic predominance with no bacteria seen on Gram stain. HSV PCR and enterovirus PCR negative.  On 10/19/2019 started on p.o. Decadron 4 mg every 6 hours x3 days by infectious disease and Tylenol as needed. No NSAIDs due  to anticoagulation on full dose subcu Lovenox.  Hospital course complicated by worsening abdominal pain for which GI was consulted and EGD was completed on 10/25/2019 by Dr. Meridee ScoreMansouraty.  Findings  include esophagitis and gastric ulcers.  Started on PPI twice daily with plan to follow-up in 3 months.  No biopsies taken due to being on Eliquis.  H. pylori serology negative.    Due to no improvement in symptomatology infectious disease was re-consulted on 10/26/2019, Unasyn started on 10/26/19 by ID.  Ordered CTA abdomen and pelvis, CT chest with contrast which showed findings concerning for multifocal pneumonia and 5.6 cm x 2.5 cm loculated appearing fluid collection adjacent to the tip of the VP shunt in the left anterior pelvis.   Discussed the case with Dr. Orvan Falconerampbell infectious disease, recommendation for neurosurgery involvement due to suspected VP shunt infection.  Seen by neurosurgery Dr. Yetta BarreJones.  Due to worsening clinical picture and concern for decompensation patient was transferred to ICU to continue work-up/treatment and close monitoring.  10/28/19: Patient was seen and examined at her bedside this morning.  Her mother was not present at the time of this visit.  Patient is asleep, on wrist restraints.  O2 saturation 97% on 5 L.  Discussed the case with Dr. Yetta BarreJones, neurosurgery, doubts that her presentation is primarily due to VP shunt infection.  Recommends LP at this time.  We will stop Eliquis and start heparin drip.  If signs of infection on LP neurosurgery may consider externalization of VP shunt.  Diagnostic radiology consulted for possible LP, CSF fluid will be sent for analysis.  10/29/19: Patient was seen and examined at bedside this morning.  Her mother was not present at the time of the visit.  She was somnolent and not in distress.  Persistent fevers with T-max 101.4.  Possible VP shunt infection.  GI panel pending.  Infectious disease, PCCM, and neurosurgery following.  Greatly appreciate assistance.   Assessment/Plan: Principal Problem:   Infection of VP (ventriculoperitoneal) shunt (HCC) Active Problems:   Hydrocephalus (HCC)   VP (ventriculoperitoneal) shunt status    Encephalopathy   Abdominal pain   Nuchal rigidity   Aseptic meningitis   Constipation   Deep vein thrombosis (DVT) of femoral vein of left lower extremity (HCC)  Recurrent fever, unclear etiology, possibly from multifocal pneumonia versus VP shunt infection versus others.  T-max 103.1 overnight Last procalcitonin 5.22 on 10/26/2019. Repeat blood cultures negative to date Sputum culture pending Diagnostic radiology consulted for possible LP on 10/30/2019.  CSF will be sent for analysis. CT abdomen and pelvis with contrast done on 10/26/2019 independently reviewed showed loculated fluid near the tip of the VP shunt in the left anterior pelvis CT chest with contrast independently viewed showed bilateral pulmonary infiltrates Currently on broad-spectrum antibiotics, IV vancomycin, IV cefepime, IV Flagyl, IV azithromycin. Continue to follow cultures, negative to date. T-max 101.4 GI panel pending  Sepsis of unclear etiology, suspect multifactorial Management as stated above  daily CBC  Suspected VP shunt infection in the setting of Hx of hydrocephalus s/p chronic VP shunt  CT head without contrast on 10/13/2019 showed a left frontal approach CSF shunt which appeared stable along with decompressed ventricles since 2014.  CT abdomen and pelvis with contrast done on 10/26/2019 with findings as stated above. Seen by neurosurgery, following. LP first as stated above, if positive then possible neurosurgical procedure with externalization of VP shunt DC Eliquis, start heparin drip  Acute hypoxic respiratory failure likely secondary to multifocal pneumonia Independently viewed  chest x-ray done on 10/27/2019 which showed bilateral pulmonary infiltrates worse at bases  On broad-spectrum IV antibiotics as stated above Ordered bronchodilators Atrovent 2 puffs 3 times daily and Xopenex 2 puffs 3 times daily Currently requiring at least 5 L of oxygen by nasal cannula Maintain O2 saturation greater  than 92% PCCM consulted and following Sputum culture pending Continue to closely monitor  Persistent sinus tachycardia/uncontrolled elevated blood pressure Sinus tachycardia likely secondary to fever versus intravascular volume depletion versus others With history of hypertension Continue p.o. Lopressor 12.5 mg twice daily Start IV labetalol 5 mg as needed for SBP greater than 160 Continue gentle IV fluid hydration due to minimal to no oral intake in the setting of acute illness  Acute blood loss anemia possibly secondary to hematuria of unclear etiology UA done on 10/27/2019 showed large hemoglobin and RBC greater than 50 Hemoglobin appears to have stabilized today 9.4 on 10/28/2019 from 9.4 on 10/27/2019.  MCV 79. Iron deficiency anemia Start ferrous sulfate 325 mg daily  Watery stools/leukocytosis WBC is uptrending from 10.1 to 16.6 on 10/29/2019 GI panel pending Continue to monitor electrolytes  Resolved post repletion: Hypokalemia Potassium 3.2>> 3.7 on 10/29/2019 Daily BMPs  Intractable abdominal pain suspect multifactorial secondary to possible VP shunt infection versus esophagitis versus nonbleeding gastric ulcers post EGD 10/25/2019 by Dr. Meridee Score. Continue p.o. PPI twice daily as recommended by GI Continue symptomatic management Carafate 4 times daily and IV morphine as needed for severe pain  Presumed aseptic meningitis Cultures and HSV, ENTEROVIRUS PCR negative Completed 3 days of Decadron on 10/21/2019 Repeat LP, diagnostic radiology consulted  Presumed Lovenox induced fevers  Was switched to Eliquis Now on heparin drip due to possible procedures  Acute left lower extremity DVT. Continue anticoagulation with heparin drip for now  Seizure disorder Continue AED Switch to IV if unable to take oral AED  Dysphagia -Given cerebral palsy, aspiration precautions  Resolved chronic constipation: Had multiple bowel movements yesterday 10/27/2019.   DVT  prophylaxis:Heparin drip Code Status:Full code, confirmed with mother Family Communication: Updated her mother in person on 10/27/2019.  Disposition Plan:Patient is currently not appropriate for discharge due to ongoing work-up and treatment for multifocal pneumonia and suspected VP shunt infection.  Consultants:   Infectious disease  Interventional radiology  GI  Neurosurgery consulted on 10/27/2019   Procedures:   Lumbar puncture by interventional radiology on 10/17/2019. EGD 10/25/2019.  Objective: Vitals:   10/29/19 1200 10/29/19 1300 10/29/19 1400 10/29/19 1500  BP: (!) 165/110 (!) 141/119 (!) 170/105 (!) 153/92  Pulse: (!) 106 (!) 104 (!) 114   Resp: 20 (!) 29 (!) 36 (!) 34  Temp:  (!) 101.6 F (38.7 C)    TempSrc:  Axillary    SpO2: 100% 94% 100%   Weight:      Height:        Intake/Output Summary (Last 24 hours) at 10/29/2019 1508 Last data filed at 10/29/2019 1500 Gross per 24 hour  Intake 2705.76 ml  Output 800 ml  Net 1905.76 ml   Filed Weights   10/13/19 1003 10/13/19 2006 10/14/19 0237  Weight: 72.6 kg 74.4 kg 74.7 kg    Exam:  . General: 33 y.o. year-old female well-developed well-nourished in no acute distress.  Somnolent  . cardiovascular: Tachycardic with no rubs or gallops.   Marland Kitchen Respiratory: Mild rales at bases no wheezing noted.  Abdomen: Distended hypoactive bowel sounds. . Musculoskeletal: Trace lower extremity edema bilaterally. Psychiatry: Unable to assess mood due to somnolence.  Data Reviewed: CBC: Recent Labs  Lab 10/25/19 0742 10/26/19 0640 10/27/19 0340 10/28/19 0118 10/29/19 0612  WBC 20.4* 18.9* 11.5* 10.1 16.6*  NEUTROABS 17.4* 15.5* 9.1*  --   --   HGB 12.4 11.8* 9.4* 9.4* 9.9*  HCT 34.3* 32.6* 25.9* 25.8* 27.7*  MCV 79.0* 80.1 79.4* 79.6* 79.8*  PLT 225 325 286 269 339   Basic Metabolic Panel: Recent Labs  Lab 10/25/19 0742 10/27/19 0340 10/28/19 0118 10/28/19 1025 10/28/19 1330 10/29/19 0612  NA 136  139 137  --   --  138  K 4.0 3.1* 3.2*  --  3.9 3.7  CL 102 105 105  --   --  107  CO2 --   --  21*  GLUCOSE 115* 136* 146*  --   --  131*  BUN 6 <5* 6  --   --  5*  CREATININE 0.52 0.44 0.49  --   --  0.57  CALCIUM 8.5* 8.2* 8.4*  --   --  8.2*  MG  --   --   --  2.4  --   --    GFR: Estimated Creatinine Clearance: 99 mL/min (by C-G formula based on SCr of 0.57 mg/dL). Liver Function Tests: Recent Labs  Lab 10/24/19 0341 10/25/19 0742 10/27/19 0340 10/28/19 0118  AST 16 17 14* 22  ALT 37 ALKPHOS 82 78 50 52  BILITOT 0.5 0.8 1.1 1.1  PROT 6.8 6.2* 6.1* 6.0*  ALBUMIN 3.1* 2.7* 3.7 3.3*   Recent Labs  Lab 10/25/19 0742  LIPASE 20   No results for input(s): AMMONIA in the last 168 hours. Coagulation Profile: No results for input(s): INR, PROTIME in the last 168 hours. Cardiac Enzymes: No results for input(s): CKTOTAL, CKMB, CKMBINDEX, TROPONINI in the last 168 hours. BNP (last 3 results) No results for input(s): PROBNP in the last 8760 hours. HbA1C: No results for input(s): HGBA1C in the last 72 hours. CBG: No results for input(s): GLUCAP in the last 168 hours. Lipid Profile: No results for input(s): CHOL, HDL, LDLCALC, TRIG, CHOLHDL, LDLDIRECT in the last 72 hours. Thyroid Function Tests: Recent Labs    10/28/19 1025  TSH 1.057   Anemia Panel: Recent Labs    10/28/19 1025  VITAMINB12 185  FERRITIN 187  TIBC 115*  IRON 18*  RETICCTPCT 1.0   Urine analysis:    Component Value Date/Time   COLORURINE AMBER (A) 10/27/2019 1429   APPEARANCEUR HAZY (A) 10/27/2019 1429   LABSPEC 1.036 (H) 10/27/2019 1429   PHURINE 5.0 10/27/2019 1429   GLUCOSEU NEGATIVE 10/27/2019 1429   HGBUR LARGE (A) 10/27/2019 1429   BILIRUBINUR NEGATIVE 10/27/2019 1429   KETONESUR NEGATIVE 10/27/2019 1429   PROTEINUR 100 (A) 10/27/2019 1429   UROBILINOGEN 1.0 01/05/2013 2116   NITRITE NEGATIVE 10/27/2019 1429   LEUKOCYTESUR NEGATIVE 10/27/2019 1429   Sepsis  Labs: (procalcitonin:4,lacticidven:4)  ) Recent Results (from the past 240 hour(s))  Culture, blood (routine x 2)     Status: None (Preliminary result)   Collection Time: 10/25/19  7:34 AM   Specimen: BLOOD LEFT HAND  Result Value Ref Range Status   Specimen Description BLOOD LEFT HAND  Final   Special Requests   Final    BOTTLES DRAWN AEROBIC ONLY Blood Culture results may not be optimal due to an inadequate volume of blood received in culture bottles   Culture   Final    NO GROWTH 4 DAYS Performed at  [facility], [place] [address], [place], [place] [address]    Report Status PENDING  Incomplete  Culture, blood (routine x 2)     Status: None (Preliminary result)   Collection Time: 10/25/19  7:25 PM   Specimen: Site Not Specified; Blood  Result Value Ref Range Status   Specimen Description SITE NOT SPECIFIED  Final   Special Requests   Final    BOTTLES DRAWN AEROBIC AND ANAEROBIC Blood Culture adequate volume   Culture   Final    NO GROWTH 4 DAYS Performed at [facility], [place] [address], [place], [place] [address]    Report Status PENDING  Incomplete  Culture, blood (routine x 2)     Status: None (Preliminary result)   Collection Time: 10/27/19 11:15 PM   Specimen: BLOOD RIGHT HAND  Result Value Ref Range Status   Specimen Description BLOOD RIGHT HAND  Final   Special Requests   Final    AEROBIC BOTTLE ONLY Blood Culture results may not be optimal due to an inadequate volume of blood received in culture bottles   Culture   Final    NO GROWTH 2 DAYS Performed at [facility], [place] [address], [place], [place] [address]    Report Status PENDING  Incomplete  Culture, blood (routine x 2)     Status: None (Preliminary result)   Collection Time: 10/27/19 11:20 PM   Specimen: BLOOD LEFT HAND  Result Value Ref Range Status   Specimen Description BLOOD LEFT HAND  Final   Special Requests AEROBIC BOTTLE ONLY Blood Culture adequate volume  Final   Culture    Final    NO GROWTH 2 DAYS Performed at [facility], [place] [address] [place], [place] [address]    Report Status PENDING  Incomplete  Cath Tip Culture     Status: None (Preliminary result)   Collection Time: 10/28/19  3:39 AM   Specimen: Catheter Tip  Result Value Ref Range Status   Specimen Description CATH TIP  Final   Special Requests NONE  Final   Culture   Final    NO GROWTH 1 DAY Performed at [facility], [place] [address] [place], [place] [address]    Report Status PENDING  Incomplete  MRSA PCR Screening     Status: None   Collection Time: 10/28/19  1:30 PM   Specimen: Nasal Mucosa; Nasopharyngeal  Result Value Ref Range Status   MRSA by PCR NEGATIVE NEGATIVE Final    Comment:        The GeneXpert MRSA Assay (FDA approved for NASAL specimens only), is one component of a comprehensive MRSA colonization surveillance program. It is not intended to diagnose MRSA infection nor to guide or monitor treatment for MRSA infections. Performed at [facility], [place] [address] [place], [place] [address]       Studies: No results found.  Scheduled Meds: . Chlorhexidine Gluconate Cloth  6 each Topical Daily  . dextromethorphan-guaiFENesin  2 tablet Oral BID  . dicyclomine  10 mg Oral TID AC & HS  . ferrous sulfate  325 mg Oral Q breakfast  . ipratropium-albuterol  3 mL Nebulization BID  . metoprolol tartrate  12.5 mg Oral BID  .  morphine injection  1 mg Intravenous Once  . pantoprazole (PROTONIX) IV  40 mg Intravenous Q12H  . phenobarbital  97.2 mg Oral QHS  . sucralfate  1 g Oral TID WC & HS  Continuous Infusions: . ceFEPime (MAXIPIME) IV Stopped (10/29/19 1329)  . dextrose 5 % and 0.9% NaCl 50 mL/hr at 10/29/19 1500  . diltiazem (CARDIZEM) infusion 10 mg/hr (10/27/19 1900)  . heparin 1,100 Units/hr (10/29/19 1500)  . metronidazole Stopped (10/29/19 1033)  . vancomycin Stopped (10/29/19 0914)     LOS: 15 days     Darlin Drop, MD [facility] Pager [phone]  If 7PM-7AM, please contact night-coverage [email or web address] Password [facility] 10/29/2019, 3:08 PM

## 2019-10-29 NOTE — Progress Notes (Signed)
NAME:  Tracey Graham, MRN:  [id number], DOB:  1986-07-07, LOS: 85 ADMISSION DATE:  10/13/2019, CONSULTATION DATE:  10/27/2019 REFERRING MD:  N[place]ada Crane, CHIEF COMPLAINT:  hypoxia   Brief History   2 week hospitalization, persistent fevers- DVT, possibly infected VP shunt. New RLL HAP.  History of present illness   Tracey Graham is a 33 year old woman who presented on 10/16 with encephalopathy, abdominal pain, and fevers.  She failed to respond to antibiotics, and was found to have a left lower extremity DVT.  Since then she has had persistent fevers, raising concern for an infection associated with her VP shunt.  CT scan demonstrated a loculated fluid collection near the tip of the shunt in her pelvis.  Due to her anticoagulation she has been unable to have this fluid sampled.  She has been followed by ID.  Today she was diagnosed with right lower lobe pneumonia with antibiotics broadened.  She has a history of chronic constipation, which has been treated aggressively today.  She has had 3 bowel movements today, but complains of some abdominal pain.  Her mother related that she has been more tachycardic today (10/31) and started coughing today.   Past Medical History  Cerebral palsy Hydrocephalus status post VP shunt (originally in [place]?; mult. Revisions)  Significant Hospital Events   Abdominal pain-EGD diagnosed peptic ulcers DVT  Consults:  Neurosurgery Infectious disease GI PCCM  Procedures:  LP 10/21 EGD 10/29 PICC left brachial 10/29 R IJ TLC 10/31  Significant Diagnostic Tests:  CT abdomen pelvis-loculated fluid collection in pelvis around EP catheter obstructed. RML opacity with air bronchograms and GGO in RLL and LLL- possibly also pneumonia   CXR 10/31-right lower lobe pneumonia CT head 10/31-stable VP shunt, no hydrocephalus  Micro Data:  Blood 10/29 >> Blood 10/23 negative CSF 10/21 negative for aerobes and anaerobes Covid 10/17 negative Blood 10/17 negative   Antimicrobials:  Vanc 10/31>> Metronidazole 10/31>> Cefepime 10/31>> Amp sulbactam 10/30-10/31 Azithromycin 10/30   Interim history/subjective:  NAEO SpO2 94-98%  Resting comfortably in bed   Objective   Blood pressure (!) 154/108, pulse (!) 106, temperature (!) 101.4 F (38.6 C), temperature source Axillary, resp. rate (!) 33, height 5\' 4"  (1.626 m), weight 74.7 kg, last menstrual period 10/01/2019, SpO2 94 %.        Intake/Output Summary (Last 24 hours) at 10/29/2019 0731 Last data filed at 10/29/2019 0600 Gross per 24 hour  Intake 2543.5 ml  Output 400 ml  Net 2143.5 ml   Filed Weights   10/13/19 1003 10/13/19 2006 10/14/19 0237  Weight: 72.6 kg 74.4 kg 74.7 kg    Examination: General: Chronically ill appearing adult Female, reclined in bed NAD   HENT: [place]. Pink mmm, anicteric sclera  Lungs: Symmetrical chest expansion. No accessory muscle use. Diminished bibasilar sounds  Cardiovascular: NSR to Sinus tach. S1s2. Cap refill < 3 seconds  Abdomen: soft, ndnt  Extremities: Symmetrical bulk and tone. Trace edema  Neuro:  Drowsy, awakens to stimulation. Intermittently agitated and states "mom" Moves spontaneously.  Skin: clean, dry, warm   Resolved Hospital Problem list     Assessment & Plan:  PCCM consulted given patient complexity and risk of further decompensation. [facility] remains primary at this time and has asked that beginning 11/3 PCCM to follow remotely.  Sepsis -without shock  -Recurrent fevers  -Sinus tachycardia likely SIRS reponse Unclear etiology: RML PNA vs shunt infection vs bacteremia UA does not suggest urinary source of infection P Appreciate ID assitance Continue broad abx  at present and narrow when appropriate  Continue to f/u micro data  APAP for fevers    Loose stool GI PCR ordered P Enteric precautions   Acute hypoxic respiratory failure  Concern for PNA as above P Titrate supplemental O2 for SpO2 > 92 CPT  PRN nebs  IS    Pseudocyst  C/f shunt infection P Per NSGY Will need LP by IR  Agitation and delirium History of cerebral palsy P Delirium precautions  Non-pharm interventions   Acute DVT P Heparin gtt Will need coordination with IR with LP  If further neurosurg procedure required, agree with NSGY rec for IVC filter placement   Acute microcytic anemia-concern for acute bleeding.  Although she had gastric ulcers, there was no evidence of bleeding at the time of her EGD and she remains on high-dose PPI P Trend CBC No present indication for product administration   Best practice:  Diet: NPO in anticipation of LP. Previously per primary (soft diet)  Pain/Anxiety/Delirium protocol (if indicated): no additional need at this time VAP protocol (if indicated): n/a DVT prophylaxis: heparin gtt  GI prophylaxis: on PPI per primary team Glucose control: n/a Mobility: n/a Code Status: full Family Communication: Will follow up with mother upon arrival to bedside  Disposition: Remains in ICU due to risk of decompensation. Discussed with primary team 11/2-- [facility] has asked PCCM to follow remotely beginning 11/3   Labs   CBC: Recent Labs  Lab 10/25/19 0742 10/26/19 0640 10/27/19 0340 10/28/19 0118 10/29/19 0612  WBC 20.4* 18.9* 11.5* 10.1 PENDING  NEUTROABS 17.4* 15.5* 9.1*  --   --   HGB 12.4 11.8* 9.4* 9.4* 9.9*  HCT 34.3* 32.6* 25.9* 25.8* 27.7*  MCV 79.0* 80.1 79.4* 79.6* 79.8*  PLT 225 325 286 269 339    Basic Metabolic Panel: Recent Labs  Lab 10/25/19 0742 10/27/19 0340 10/28/19 0118 10/28/19 1025 10/28/19 1330 10/29/19 0612  NA 136 139 137  --   --  138  K 4.0 3.1* 3.2*  --  3.9 3.7  CL 102 105 105  --   --  107  CO2 24 23 22   --   --  21*  GLUCOSE 115* 136* 146*  --   --  131*  BUN 6 <5* 6  --   --  5*  CREATININE 0.52 0.44 0.49  --   --  0.57  CALCIUM 8.5* 8.2* 8.4*  --   --  8.2*  MG  --   --   --  2.4  --   --    GFR: Estimated Creatinine Clearance: 99 mL/min (by  C-G formula based on SCr of 0.57 mg/dL). Recent Labs  Lab 10/25/19 0742 10/26/19 0541 10/26/19 0640 10/26/19 1845 10/26/19 2145 10/27/19 0340 10/28/19 0118 10/29/19 0612  PROCALCITON 7.00 5.22  --   --   --   --   --   --   WBC 20.4*  --  18.9*  --   --  11.5* 10.1 PENDING  LATICACIDVEN 1.1  --   --  0.7 0.8  --  0.5  --     Liver Function Tests: Recent Labs  Lab 10/24/19 0341 10/25/19 0742 10/27/19 0340 10/28/19 0118  AST 16 17 14* 22  ALT 37 29 16 25   ALKPHOS 82 78 50 52  BILITOT 0.5 0.8 1.1 1.1  PROT 6.8 6.2* 6.1* 6.0*  ALBUMIN 3.1* 2.7* 3.7 3.3*   Recent Labs  Lab 10/25/19 0742  LIPASE 20  No results for input(s): AMMONIA in the last 168 hours.  ABG No results found for: PHART, PCO2ART, PO2ART, HCO3, TCO2, ACIDBASEDEF, O2SAT   Coagulation Profile: No results for input(s): INR, PROTIME in the last 168 hours.  Cardiac Enzymes: No results for input(s): CKTOTAL, CKMB, CKMBINDEX, TROPONINI in the last 168 hours.  HbA1C: No results found for: HGBA1C  CBG: No results for input(s): GLUCAP in the last 168 hours.   CRITICAL CARE Performed by: Lanier Clam   Total critical care time: 31 minutes  Critical care time was exclusive of separately billable procedures and treating other patients. Critical care was necessary to treat or prevent imminent or life-threatening deterioration.  Critical care was time spent personally by me on the following activities: development of treatment plan with patient and/or surrogate as well as nursing, discussions with consultants, evaluation of patient's response to treatment, examination of patient, obtaining history from patient or surrogate, ordering and performing treatments and interventions, ordering and review of laboratory studies, ordering and review of radiographic studies, pulse oximetry and re-evaluation of patient's condition.   Tessie Fass MSN, AGACNP-BC [facility] [phone]  If no answer, [phone] 10/29/2019, 7:31 AM

## 2019-10-29 NOTE — Progress Notes (Signed)
ANTICOAGULATION CONSULT NOTE  Pharmacy Consult :  Heparin Indication: DVT  No Active Allergies  Patient Measurements: Height: 5\' 4"  (162.6 cm) Weight: 164 lb 10.9 oz (74.7 kg) IBW/kg (Calculated) : 54.7 Heparin Dosing Weight: 70kg  Vital Signs: Temp: 101.1 F (38.4 C) (11/02 0800) Temp Source: Axillary (11/02 0800) BP: 140/103 (11/02 0800) Pulse Rate: 98 (11/02 0800)  Labs: Recent Labs    10/27/19 0340 10/28/19 0118 10/28/19 1835 10/29/19 0612 10/29/19 0630  HGB 9.4* 9.4*  --  9.9*  --   HCT 25.9* 25.8*  --  27.7*  --   PLT 286 269  --  339  --   APTT  --   --  96*  --  81*  CREATININE 0.44 0.49  --  0.57  --     Estimated Creatinine Clearance: 99 mL/min (by C-G formula based on SCr of 0.57 mg/dL).   Assessment: 89 YOF with acute DVT started on lovenox and transitioned to Eliquis.  Now with possible VP shunt infection and transitioned to heparin for procedures.  Last Eliquis administered 10/31 at 2250, will follow aPTT until HL correlated.    APTT therapeutic at 81 secs; no bleeding reported.  Goal of Therapy:  Heparin level 0.3-0.7 units/ml aPTT 66-102 seconds Monitor platelets by anticoagulation protocol: Yes   Plan:  Continue heparin gtt at 1100 units/hr F/U AM labs - assess Heparin level to see if it correlates with APTT  Alanda Slim, PharmD, [place] Pharmacist Please see AMION for all Pharmacists' Contact Phone Numbers 10/29/2019, 8:41 AM

## 2019-10-29 NOTE — Progress Notes (Signed)
[facility] for Infectious Disease   Reason for visit: Follow up on fever  Interval History: WBC up to 16.6 today.  Fever to 101 today.  Mother at bedside reports continued abdominal pain but seems improved compared to last week.  GI pathogen panel sent off.    Physical Exam: Constitutional:  Vitals:   10/29/19 1200 10/29/19 1300  BP: (!) 165/110 (!) 141/119  Pulse: (!) 106 (!) 104  Resp: 20 (!) 29  Temp:  (!) 101.6 F (38.7 C)  SpO2: 100% 94%   patient appears in NAD, non-verbal Eyes: anicteric Respiratory: Normal respiratory effort; CTA B Cardiovascular: RRR GI: soft, some tenderness noted  Review of Systems: Unable to be assessed due to mental status  Lab Results  Component Value Date   WBC 16.6 (H) 10/29/2019   HGB 9.9 (L) 10/29/2019   HCT 27.7 (L) 10/29/2019   MCV 79.8 (L) 10/29/2019   PLT 339 10/29/2019    Lab Results  Component Value Date   CREATININE 0.57 10/29/2019   BUN 5 (L) 10/29/2019   NA 138 10/29/2019   K 3.7 10/29/2019   CL 107 10/29/2019   CO2 21 (L) 10/29/2019    Lab Results  Component Value Date   ALT 25 10/28/2019   AST 22 10/28/2019   ALKPHOS 52 10/28/2019     Microbiology: Recent Results (from the past 240 hour(s))  Culture, blood (routine x 2)     Status: None (Preliminary result)   Collection Time: 10/25/19  7:34 AM   Specimen: BLOOD LEFT HAND  Result Value Ref Range Status   Specimen Description BLOOD LEFT HAND  Final   Special Requests   Final    BOTTLES DRAWN AEROBIC ONLY Blood Culture results may not be optimal due to an inadequate volume of blood received in culture bottles   Culture   Final    NO GROWTH 4 DAYS Performed at [facility], [place] [address] [place], [place] [address]    Report Status PENDING  Incomplete  Culture, blood (routine x 2)     Status: None (Preliminary result)   Collection Time: 10/25/19  7:25 PM   Specimen: Site Not Specified; Blood  Result Value Ref Range Status   Specimen  Description SITE NOT SPECIFIED  Final   Special Requests   Final    BOTTLES DRAWN AEROBIC AND ANAEROBIC Blood Culture adequate volume   Culture   Final    NO GROWTH 4 DAYS Performed at [facility], [place] [address], [place], [place] [address]    Report Status PENDING  Incomplete  Culture, blood (routine x 2)     Status: None (Preliminary result)   Collection Time: 10/27/19 11:15 PM   Specimen: BLOOD RIGHT HAND  Result Value Ref Range Status   Specimen Description BLOOD RIGHT HAND  Final   Special Requests   Final    AEROBIC BOTTLE ONLY Blood Culture results may not be optimal due to an inadequate volume of blood received in culture bottles   Culture   Final    NO GROWTH 2 DAYS Performed at [facility], [place] [address] [place], [place] [address]    Report Status PENDING  Incomplete  Culture, blood (routine x 2)     Status: None (Preliminary result)   Collection Time: 10/27/19 11:20 PM   Specimen: BLOOD LEFT HAND  Result Value Ref Range Status   Specimen Description BLOOD LEFT HAND  Final   Special Requests  AEROBIC BOTTLE ONLY Blood Culture adequate volume  Final   Culture   Final    NO GROWTH 2 DAYS Performed at [facility], [address], [place], [place] [address]    Report Status PENDING  Incomplete  Cath Tip Culture     Status: None (Preliminary result)   Collection Time: 10/28/19  3:39 AM   Specimen: Catheter Tip  Result Value Ref Range Status   Specimen Description CATH TIP  Final   Special Requests NONE  Final   Culture   Final    NO GROWTH 1 DAY Performed at [facility], [address] [place], [place] [address]    Report Status PENDING  Incomplete  MRSA PCR Screening     Status: None   Collection Time: 10/28/19  1:30 PM   Specimen: Nasal Mucosa; Nasopharyngeal  Result Value Ref Range Status   MRSA by PCR NEGATIVE NEGATIVE Final    Comment:        The GeneXpert MRSA Assay (FDA approved for NASAL specimens only), is one component of  a comprehensive MRSA colonization surveillance program. It is not intended to diagnose MRSA infection nor to guide or monitor treatment for MRSA infections. Performed at [facility], [address] [place], [place] [address]     Impression/Plan:  1. Possible VP shunt infection - loculated fluid near shunt on CT scan, CSF pleocytosis.  On broad spectrum therapy broadened over the weekend to vancomycin, cefepime, metronidazole.  Will continue with this. LP scheduled for tomorrow am.    2. Leukocytosis - elevated today to 16.6.  Had trended downward. Will recheck again tomorrow to see if elevation holds/worsens.    3.  Possible pneumonia - bibasilar infiltrates and on antibiotics as above.

## 2019-10-30 ENCOUNTER — Inpatient Hospital Stay (HOSPITAL_COMMUNITY): Payer: Medicaid Other

## 2019-10-30 LAB — CBC
HCT: 27.7 % — ABNORMAL LOW (ref 36.0–46.0)
Hemoglobin: 9.8 g/dL — ABNORMAL LOW (ref 12.0–15.0)
MCH: 28.5 pg (ref 26.0–34.0)
MCHC: 35.4 g/dL (ref 30.0–36.0)
MCV: 80.5 fL (ref 80.0–100.0)
Platelets: 347 10*3/uL (ref 150–400)
RBC: 3.44 MIL/uL — ABNORMAL LOW (ref 3.87–5.11)
RDW: 13.9 % (ref 11.5–15.5)
WBC: 14.5 10*3/uL — ABNORMAL HIGH (ref 4.0–10.5)
nRBC: 0 % (ref 0.0–0.2)

## 2019-10-30 LAB — CSF CELL COUNT WITH DIFFERENTIAL
Eosinophils, CSF: 0 % (ref 0–1)
Lymphs, CSF: 92 % — ABNORMAL HIGH (ref 40–80)
Lymphs, CSF: 96 % — ABNORMAL HIGH (ref 40–80)
Monocyte-Macrophage-Spinal Fluid: 8 % — ABNORMAL LOW (ref 15–45)
Monocyte-Macrophage-Spinal Fluid: 4 % — ABNORMAL LOW (ref 15–45)
Other Cells, CSF: 0
RBC Count, CSF: 1 /mm3 — ABNORMAL HIGH
RBC Count, CSF: 75 /mm3 — ABNORMAL HIGH
Segmented Neutrophils-CSF: 0 % (ref 0–6)
Tube #: 3
Tube #: 1
WBC, CSF: 61 /mm3 (ref 0–5)
WBC, CSF: 76 /mm3 (ref 0–5)

## 2019-10-30 LAB — COMPREHENSIVE METABOLIC PANEL
ALT: 28 U/L (ref 0–44)
AST: 20 U/L (ref 15–41)
Albumin: 2.8 g/dL — ABNORMAL LOW (ref 3.5–5.0)
Alkaline Phosphatase: 55 U/L (ref 38–126)
Anion gap: 8 (ref 5–15)
BUN: <5 mg/dL — ABNORMAL LOW (ref 6–20)
CO2: 22 mmol/L (ref 22–32)
Calcium: 8.2 mg/dL — ABNORMAL LOW (ref 8.9–10.3)
Chloride: 110 mmol/L (ref 98–111)
Creatinine, Ser: 0.59 mg/dL (ref 0.44–1.00)
GFR calc Af Amer: >60 mL/min (ref >60)
GFR calc non Af Amer: >60 mL/min (ref >60)
Glucose, Bld: 107 mg/dL — ABNORMAL HIGH (ref 70–99)
Potassium: 3.3 mmol/L — ABNORMAL LOW (ref 3.5–5.1)
Sodium: 140 mmol/L (ref 135–145)
Total Bilirubin: 0.6 mg/dL (ref 0.3–1.2)
Total Protein: 5.8 g/dL — ABNORMAL LOW (ref 6.5–8.1)

## 2019-10-30 LAB — GASTROINTESTINAL PANEL BY PCR, STOOL (REPLACES STOOL CULTURE)

## 2019-10-30 LAB — GLUCOSE, CSF: Glucose, CSF: 64 mg/dL (ref 40–70)

## 2019-10-30 LAB — CULTURE, BLOOD (ROUTINE X 2)
Culture: NO GROWTH
Culture: NO GROWTH
Special Requests: ADEQUATE

## 2019-10-30 LAB — LACTIC ACID, PLASMA: Lactic Acid, Venous: 1.2 mmol/L (ref 0.5–1.9)

## 2019-10-30 LAB — HAPTOGLOBIN: Haptoglobin: 372 mg/dL — ABNORMAL HIGH (ref 33–278)

## 2019-10-30 LAB — PROTEIN, CSF: Total  Protein, CSF: 80 mg/dL — ABNORMAL HIGH (ref 15–45)

## 2019-10-30 LAB — APTT: aPTT: 97 seconds — ABNORMAL HIGH (ref 24–36)

## 2019-10-30 LAB — HEPARIN LEVEL (UNFRACTIONATED): Heparin Unfractionated: 0.97 IU/mL — ABNORMAL HIGH (ref 0.30–0.70)

## 2019-10-30 LAB — PROCALCITONIN: Procalcitonin: 0.52 ng/mL

## 2019-10-30 MED ORDER — ACETAMINOPHEN 500 MG PO TABS
500.0000 mg | ORAL_TABLET | Freq: Four times a day (QID) | ORAL | Status: AC
  Administered 2019-10-30 – 2019-10-31 (×3): 500 mg via ORAL
  Filled 2019-10-30 (×3): qty 1

## 2019-10-30 MED ORDER — HEPARIN (PORCINE) 25000 UT/250ML-% IV SOLN
800.0000 [IU]/h | INTRAVENOUS | Status: DC
  Administered 2019-10-30: 1050 [IU]/h via INTRAVENOUS
  Administered 2019-10-31 – 2019-11-04 (×4): 1000 [IU]/h via INTRAVENOUS
  Administered 2019-11-05: 02:00:00 1050 [IU]/h via INTRAVENOUS
  Administered 2019-11-06: 1150 [IU]/h via INTRAVENOUS
  Administered 2019-11-07: 17:00:00 700 [IU]/h via INTRAVENOUS
  Administered 2019-11-09: 01:00:00 850 [IU]/h via INTRAVENOUS
  Administered 2019-11-10 – 2019-11-11 (×2): 800 [IU]/h via INTRAVENOUS
  Filled 2019-10-30 (×11): qty 250

## 2019-10-30 MED ORDER — LIDOCAINE HCL (PF) 1 % IJ SOLN
5.0000 mL | Freq: Once | INTRAMUSCULAR | Status: AC
  Administered 2019-10-30: 5 mL via INTRADERMAL

## 2019-10-30 MED ORDER — VANCOMYCIN HCL IN DEXTROSE 750-5 MG/150ML-% IV SOLN
750.0000 mg | Freq: Three times a day (TID) | INTRAVENOUS | Status: DC
  Administered 2019-10-30 – 2019-11-01 (×6): 750 mg via INTRAVENOUS
  Filled 2019-10-30 (×8): qty 150

## 2019-10-30 MED ORDER — POTASSIUM CHLORIDE 10 MEQ/100ML IV SOLN
10.0000 meq | INTRAVENOUS | Status: AC
  Administered 2019-10-30 (×3): 10 meq via INTRAVENOUS
  Filled 2019-10-30 (×3): qty 100

## 2019-10-30 NOTE — Progress Notes (Addendum)
ANTICOAGULATION & ANTIBIOTIC CONSULT NOTE  Pharmacy Consult :  Heparin + Vancomycin/Cefepime Indication: DVT + VP shunt infection  No Active Allergies  Patient Measurements: Height: 5\' 4"  (162.6 cm) Weight: 164 lb 10.9 oz (74.7 kg) IBW/kg (Calculated) : 54.7 Heparin Dosing Weight: 70kg  Vital Signs: Temp: 98.6 F (37 C) (11/03 0400) Temp Source: Axillary (11/03 0400) BP: 150/112 (11/03 0800) Pulse Rate: 103 (11/03 0735)  Labs: Recent Labs    10/28/19 0118 10/28/19 1025 10/28/19 1835 10/29/19 0612 10/29/19 0630 10/29/19 1115 10/30/19 0441 10/30/19 0626  HGB 9.4*  --   --  9.9*  --   --  9.8*  --   HCT 25.8* 28.8*  --  27.7*  --   --  27.7*  --   PLT 269  --   --  339  --   --  347  --   APTT  --   --  96*  --  81*  --   --  97*  HEPARINUNFRC  --   --   --   --   --  1.20*  --  0.97*  CREATININE 0.49  --   --  0.57  --   --  0.59  --     Estimated Creatinine Clearance: 99 mL/min (by C-G formula based on SCr of 0.59 mg/dL).   Assessment: 54 YOF with acute DVT started on lovenox and transitioned to Eliquis.  Now with possible VP shunt infection and transitioned to heparin for procedures.  Last Eliquis administered 10/31 at 2250, will follow aPTT until HL correlated.  APTT is therapeutic and toward the high end of normal.  No bleeding reported.  Heparin was placed on hold since ~0615 for LP.  Pharmacy also dosing vancomycin and cefepime for VP shunt infection.  Patient is also on Flagyl.  There is also concern for aseptic meningitis; therefore, will switch vancomycin from AUC to trough dosing.  Renal function is stable, Tmax 101.6, WBC up to 14.5.  Goal of Therapy:  Heparin level 0.3-0.7 units/ml aPTT 66-102 seconds Monitor platelets by anticoagulation protocol: Yes  Vanc trough 15-20 mcg/mL   Plan:  F/U resume heparin   Change vanc to 750mg  IV Q8H Cefepime 2gm IV Q8H Flagyl 500mg  IV Q8H per MD Monitor renal fxn, clinical progress, vanc trough at Css  Ron Junco D.  [name], PharmD, BCPS, [place] 10/30/2019, 11:30 AM   ====================================  Addendum:  Spoke to IR, okay to resume heparin infusion now Restart at 1050 units/hr F/U AM labs since the last 3 aPTTs were therapeutic  Shonita Rinck D. [name], PharmD, BCPS, [place] 10/30/2019, 1:25 PM

## 2019-10-30 NOTE — Progress Notes (Signed)
CRITICAL VALUE ALERT  Critical Value:  LP WBC 61  Date & Time Notied:  10/30/19  1103  Provider Notified: [name] Pap DO  Orders Received/Actions taken: No new orders at this time.

## 2019-10-30 NOTE — Procedures (Signed)
Procedure: Fluoro guided LP at L2/3. Specimen: CSF - to lab Bleeding: minimal. Complications: None immediate. Patient   -Condition: Stable.  -Disposition:  Return to inpatient ward.  Full Radiology Report to follow under IMAGING

## 2019-10-30 NOTE — Progress Notes (Addendum)
Neurosurgery Service Progress Note  Subjective: No acute events overnight   Objective: Vitals:   10/30/19 0600 10/30/19 0700 10/30/19 0735 10/30/19 0800  BP: (!) 160/107 (!) 156/99 (!) 156/99 (!) 150/112  Pulse: 87 92 (!) 103   Resp: (!) 30 (!) 26 (!) 27 17  Temp:      TempSrc:      SpO2: 100% 100% 95%   Weight:      Height:       Temp (24hrs), Avg:100.5 F (38.1 C), Min:98.6 F (37 C), Max:101.6 F (38.7 C)  CBC Latest Ref Rng & Units 10/30/2019 10/29/2019 10/28/2019  WBC 4.0 - 10.5 K/uL 14.5(H) 16.6(H) 10.1  Hemoglobin 12.0 - 15.0 g/dL 7.9(X) 5.0(V) 6.9(V)  Hematocrit 36.0 - 46.0 % 27.7(L) 27.7(L) 28.8(L)  Platelets 150 - 400 K/uL 347 339 269   BMP Latest Ref Rng & Units 10/30/2019 10/29/2019 10/28/2019  Glucose 70 - 99 mg/dL 948(A) 165(V) -  BUN 6 - 20 mg/dL <3(Z) 5(L) -  Creatinine 0.44 - 1.00 mg/dL 4.82 7.07 -  Sodium 867 - 145 mmol/L 140 138 -  Potassium 3.5 - 5.1 mmol/L 3.3(L) 3.7 3.9  Chloride 98 - 111 mmol/L 110 107 -  CO2 22 - 32 mmol/L 22 21(L) -  Calcium 8.9 - 10.3 mg/dL 8.2(L) 8.2(L) -    Intake/Output Summary (Last 24 hours) at 10/30/2019 1019 Last data filed at 10/30/2019 0800 Gross per 24 hour  Intake 2196.55 ml  Output 2950 ml  Net -753.45 ml    Current Facility-Administered Medications:  .  acetaminophen (TYLENOL) suppository 650 mg, 650 mg, Rectal, Q8H PRN, Blount, Xenia T, NP, 650 mg at 10/28/19 0315 .  acetaminophen (TYLENOL) tablet 500 mg, 500 mg, Oral, Q6H, Hall, Carole N, DO .  acetaminophen (TYLENOL) tablet 650 mg, 650 mg, Oral, Q6H PRN, Jacques Navy, MD, 650 mg at 10/30/19 0959 .  bisacodyl (DULCOLAX) suppository 10 mg, 10 mg, Rectal, Daily PRN, Dow Adolph N, DO, 10 mg at 10/23/19 0418 .  ceFEPIme (MAXIPIME) 2 g in sodium chloride 0.9 % 100 mL IVPB, 2 g, Intravenous, Q8H, Gerilyn Nestle, RPH, Stopped at 10/30/19 0537 .  Chlorhexidine Gluconate Cloth 2 % PADS 6 each, 6 each, Topical, Daily, Dow Adolph N, DO, 6 each at 10/28/19 0900 .   dextromethorphan-guaiFENesin (MUCINEX DM) 30-600 MG per 12 hr tablet 2 tablet, 2 tablet, Oral, BID, Hall, Carole N, DO, 2 tablet at 10/30/19 1013 .  dextrose 5 %-0.9 % sodium chloride infusion, , Intravenous, Continuous, Hall, Carole N, DO, Last Rate: 50 mL/hr at 10/30/19 0800 .  dicyclomine (BENTYL) capsule 10 mg, 10 mg, Oral, TID AC & HS, Dianah Field, PA-C, 10 mg at 10/30/19 1013 .  diltiazem (CARDIZEM) 125 mg in dextrose 5% 125 mL (1 mg/mL) infusion, 5-15 mg/hr, Intravenous, Titrated, Hall, Carole N, DO, Last Rate: 10 mL/hr at 10/27/19 1900, 10 mg/hr at 10/27/19 1900 .  diphenhydrAMINE (BENADRYL) capsule 25 mg, 25 mg, Oral, Q6H PRN, Hall, Carole N, DO, 25 mg at 10/27/19 1200 .  ferrous sulfate tablet 325 mg, 325 mg, Oral, Q breakfast, Hall, Carole N, DO, 325 mg at 10/30/19 1013 .  heparin ADULT infusion 100 units/mL (25000 units/270mL sodium chloride 0.45%), 1,100 Units/hr, Intravenous, Continuous, Gerilyn Nestle, RPH, Stopped at 10/30/19 5449 .  ipratropium-albuterol (DUONEB) 0.5-2.5 (3) MG/3ML nebulizer solution 3 mL, 3 mL, Nebulization, BID, [place] Hedman, Clovis Pu, MD, 3 mL at 10/30/19 0745 .  labetalol (NORMODYNE) injection 5 mg, 5 mg, Intravenous, Q2H  PRN, [name] Pap N, DO, 5 mg at 10/29/19 2309 .  levalbuterol (XOPENEX HFA) inhaler 1 puff, 1 puff, Inhalation, Q8H PRN, Hall, Carole N, DO .  lidocaine (XYLOCAINE) 1 % (with pres) injection, , Infiltration, PRN, Bruning, Kevin, PA-C, 5 mL at 10/25/19 1236 .  metoprolol tartrate (LOPRESSOR) tablet 12.5 mg, 12.5 mg, Oral, TID, Hall, Carole N, DO, 12.5 mg at 10/30/19 1014 .  metroNIDAZOLE (FLAGYL) IVPB 500 mg, 500 mg, Intravenous, Q8H, Michel Bickers, MD, Stopped at 10/30/19 0315 .  morphine 2 MG/ML injection 1 mg, 1 mg, Intravenous, Q4H PRN, [name] Pap N, DO, 1 mg at 10/30/19 0758 .  morphine 2 MG/ML injection 1 mg, 1 mg, Intravenous, Once, [name], Belinda K, NP .  ondansetron (ZOFRAN) tablet 4 mg, 4 mg, Oral, Q6H PRN **OR** ondansetron (ZOFRAN)  injection 4 mg, 4 mg, Intravenous, Q6H PRN, Lenore Cordia, MD, 4 mg at 10/25/19 1937 .  pantoprazole (PROTONIX) injection 40 mg, 40 mg, Intravenous, Q12H, Hall, Carole N, DO, 40 mg at 10/30/19 1010 .  PHENobarbital (LUMINAL) tablet 97.2 mg, 97.2 mg, Oral, QHS, Truett Mainland, DO, 97.2 mg at 10/28/19 2232 .  polyethylene glycol (MIRALAX / GLYCOLAX) packet 17 g, 17 g, Oral, Daily PRN, Hall, Carole N, DO .  sodium chloride flush (NS) 0.9 % injection 10-40 mL, 10-40 mL, Intracatheter, PRN, Amin, Ankit Chirag, MD .  sucralfate (CARAFATE) 1 GM/10ML suspension 1 g, 1 g, Oral, TID WC & HS, Hall, Carole N, DO, 1 g at 10/30/19 1012 .  vancomycin (VANCOCIN) IVPB 1000 mg/200 mL premix, 1,000 mg, Intravenous, Q12H, Dang, Thuy D, RPH, Last Rate: 200 mL/hr at 10/30/19 1009, 1,000 mg at 10/30/19 1009   Physical Exam: Awake/alert, answers yes/no to some questions, spastic quadriparesis Abdomen appears mildly distended, hypertympanic, but non-tender  Assessment & Plan: 33 y.o. woman w/ h/o MRCP, shunted hydrocephalus, admitted for abdominal pain and fevers, prolonged course w/ LP w/ pleocytosis w/o positive culture, DVT, then RLL PNA. Initial CT A/P showed normal trace fluid 2/2 CSF drainage, but repeat CT A/P showed possible catheter tip pseudocyst with some reactive changes. Last VPS revision was 20+ years ago. Proximal catheter in interhemispheric space.   -CSF prelim results from LP today - no opening pressure recorded, 61W 1R TP80 G64 w/ 92% lymphs. Cultures pending but not concerning for bacterial meningitis / ventriculitis, <100W in CSF has a very high NPV for acute bacterial meningitis. Given that her shunt clinically & radiographically appears to be working, I do not think we have to externalize her at this point. I believe that she is on day 3 of broad spectrum ABx. CSF Cx are pending. We should repeat the CT abdomen/pelvis on day 7 of ABx and see if the cyst has changed. Given that the cyst appeared in  between scans 14d apart, it should continue to enlarge if it is not responding to ABx.   Joyice Faster Denny Lave  10/30/19 10:19 AM

## 2019-10-30 NOTE — Progress Notes (Addendum)
PROGRESS NOTE  Tracey Graham ZOX:[id number] DOB: 08-Dec-1986 DOA: 10/13/2019 PCP: [facility]  HPI/Recap of past 24 hours: Tracey Graham is a 33 y.o. female with medical history significant for cerebral palsy, wheelchair-bound and fully dependent with ADLs, hydrocephalus s/p VP shunt, seizure disorder, and intellectual disability who presents to the ED for evaluation of fevers and abdominal pain.  Patient is unable to provide history herself therefore entirety of history is obtained from mother at bedside, EDP, and chart review.  Per mother, patient was in her usual state of health until she awoke the morning of 10/12/2019.  She was crying and appeared uncomfortable which is a new change for her.  She seemed to indicate that she has been having abdominal pain.  She has also been having fevers at home.  Mother thinks that she has been having some nausea without emesis.  She does have a history of dysphagia and does choke on food on occasion.  She has not had any cough.  She has not had any diarrhea.  Mother thinks that there is some decreased urinary frequency compared to normal.  She states the only medication she is taking his phenobarbital for history of seizures but has not had any seizures for many years.  ED Course:  Initial vitals showed BP 99/85, pulse 114, RR 13, temp 101.8 Fahrenheit, SPO2 95% on room air.  Labs are notable for WBC 11.4, hemoglobin 15.4, platelets 253,000, sodium 137, potassium 4.0, bicarb 22, BUN 5, creatinine 0.61, LFTs within normal limits, lactic acid 1.1.  Urinalysis was negative for UTI.  I-STAT beta-hCG <5.0.  Blood cultures were obtained and pending.  SARS-CoV-2 test negative on 10/13/19.  2 view chest x-ray was negative for focal consolidation, effusion, or infiltrate.  CT abdomen/pelvis without contrast showed trace lower abdominal pelvic free fluid per radiology may be physiologic or related to chronic VP shunt tubing.  No fluid  collection, abscess, or acute intra-abdominal or pelvic finding seen.  VP shunt series showed visualized VP shunt catheter intact on skull, chest, and abdominal x-rays.  CT head without contrast showed a left frontal approach CSF shunt which appeared stable along with decompressed ventricles since 2014.  No acute intracranial abnormality noted.  Patient was given multiple rounds of IV morphine for pain and 1 L normal saline.  EDP discussed the case with neurosurgery who did not feel tap of the VP shunt was indicated and that it would be unlikely to cause abdominal pain.  -In emergency room was febrile to 101.8, white count was 11.3, rest of her electrolytes were unremarkable, urinalysis was negative, chest x-ray was unremarkable, COVID-19 PCR was negative  -Subsequent work-up noted left leg DVT, started anticoagulation, full dose SQ lovenox BID. -slow improvement but low grade fevers persisted. LP done to r/o meningitis was completed on 10/17/19. LP fluid analysis showed pleocytosis, elevated protein and lymphocytic predominance with no bacteria seen on Gram stain. HSV PCR and enterovirus PCR negative.  On 10/19/2019 due to persistent fevers and concern for aseptic meningitis, was started on p.o. Decadron 4 mg every 6 hours x3 days by infectious disease and Tylenol as needed. No NSAIDs due to anticoagulation on full dose subcu Lovenox.   Full dose Lovenox was stopped and patient was started on Eliquis due to suspicion for Lovenox induced fever. Fever subsided.  On 10/24/19, hospital course complicated by worsening abdominal pain for which GI was consulted and EGD completed on 10/25/2019 by Dr. Meridee Score.  Findings included esophagitis and gastric ulcers.  Was started on PPI twice daily with plan to follow-up in 3 months outpatient.  No biopsies taken due to being on Eliquis.  H. pylori serology negative.   Due to no improvement in symptomatology infectious disease was re-consulted on 10/26/2019,  Unasyn started on 10/26/19 by ID.  Ordered CTA abdomen and pelvis to r/o mesenteric ischemia or abscesses. CT chest with contrast ordered and showed findings concerning for multifocal pneumonia. CTabd/pelvis w contrast showed 5.6 cm x 2.5 cm loculated appearing fluid collection adjacent to the tip of the VP shunt in the left anterior pelvis suspicious for VP shunt infection.   Discussed the case with Dr. Orvan Falconer infectious disease and Dr. Yetta Barre neurosurgery. Dr. Yetta Barre recommended to repeat LP and if it shows signs of infection then would proceed to externalize the shunt and drain the loculated fluid.  Due to worsening clinical picture and concern for decompensation patient was transferred to ICU to continue work-up/treatment and for closer monitoring on 10/27/19.  Repeated LP completed on 10/30/19 (10/17/19). CSF sent for analysis.  10/30/19: Patient was seen and examined at her bedside this morning she is alert.  Did not appear in distress.  Her mother was not present at the time of this visit. LP completed.   Assessment/Plan: Principal Problem:   Infection of VP (ventriculoperitoneal) shunt (HCC) Active Problems:   Hydrocephalus (HCC)   VP (ventriculoperitoneal) shunt status   Encephalopathy   Abdominal pain   Nuchal rigidity   Aseptic meningitis   Constipation   Deep vein thrombosis (DVT) of femoral vein of left lower extremity (HCC)  Recurrent fever, unclear etiology, possibly from multifocal pneumonia versus VP shunt infection versus others.    T-max 100.9 on scheduled Tylenol. Last procalcitonin 5.22 on 10/26/2019, repeated procalcitonin 0.52 on 10/30/2019. Blood cultures negative to date. GI panel negative Formed stool reported on 10/29/2019 CT abdomen and pelvis with contrast done on 10/26/2019 independently reviewed showed loculated fluid near the tip of the VP shunt in the left anterior pelvis CT chest with contrast independently viewed showed bilateral pulmonary  infiltrates Currently on broad-spectrum antibiotics, IV vancomycin, IV cefepime, IV Flagyl, IV azithromycin. First LP completed on 10/17/2019 unrevealing Second LP completed on 10/30/2019 shows lymphocytosis and pleocytosis on Gram stain, cultures/cytology in process  Sepsis of unclear etiology, suspect multifactorial Management as stated above Sepsis criteria appears to be improving WBC 14 K from 16 K Lactic acid 1.2 on 10/30/19 Procalcitonin 0.52 on 10/30/19 Continue Daily CBC  Suspected VP shunt infection in the setting of Hx of hydrocephalus s/p chronic VP shunt  CT head without contrast on 10/13/2019 showed a left frontal approach CSF shunt which appeared stable along with decompressed ventricles since 2014.  CT abdomen and pelvis with contrast done on 10/26/2019 with findings as stated above. Seen by neurosurgery, following. LP first as stated above, if positive then possible neurosurgical procedure with externalization of VP shunt Eliquis on hold Continue heparin drip until NS decides on procedures.  Acute hypoxic respiratory failure likely secondary to multifocal pneumonia Independently viewed chest x-ray done on 10/27/2019 which showed bilateral pulmonary infiltrates worse at bases  On broad-spectrum IV antibiotics as stated above Ordered bronchodilators Atrovent 2 puffs 3 times daily and Xopenex 2 puffs 3 times daily Currently on 5 L of oxygen by nasal cannula Continue to maintain O2 saturation greater than 92%  Persistent sinus tachycardia/uncontrolled elevated blood pressure Sinus tachycardia likely secondary to fever versus intravascular volume depletion versus others No history of hypertension Continue p.o. Lopressor 12.5 mg, increase  frequency to 3 times daily Continue IV labetalol 5 mg as needed for SBP greater than 160 Continue gentle IV fluid hydration due to minimal to no oral intake in the setting of acute illness  Suspected dysphagia Appears to tolerate soft  diet Speech therapist to assess Aspirations precautions  Acute blood loss anemia iron deficiency anemia  UA done on 10/27/2019 showed large hemoglobin and RBC greater than 50 Hemoglobin appears to have stabilized 9.8 on 10/30/2019 from 9.9 on 10/29/2019.  Continue ferrous sulfate 325 mg daily  Resolving watery stools/leukocytosis Formed stool reported on 10/29/2019. GI panel negative  Refractory hypokalemia Potassium 3.2>> 3.7 on 10/29/2019>> 3.3 on 10/30/2019 Replete as indicated Continue daily BMPs  Resolved intractable abdominal pain suspect multifactorial secondary to possible VP shunt infection versus esophagitis versus nonbleeding gastric ulcers post EGD 10/25/2019 by Dr. Rush Landmark. Continue p.o. PPI twice daily as recommended by GI Continue symptomatic management Carafate 4 times daily and IV morphine as needed for severe pain  Presumed aseptic meningitis First LP Cultures and HSV, ENTEROVIRUS PCR negative Completed 3 days of Decadron on 10/21/2019 LP, diagnostic radiology consulted for repeat LP 10/30/19, completed.  Presumed Lovenox induced fevers  Was switched to Eliquis Will resume Eliquis once no planned future procedures by neurosurgery  Acute left lower extremity DVT. Continue anticoagulation Current on hep drip but will switch back to eliquis  Seizure disorder Continue AED Switch to IV if unable to take oral AED Avoid missing doses of AEDs  Resolved chronic constipation  DVT prophylaxis:Heparin drip Code Status:Full code, confirmed with mother Family Communication: Updated her mother in person on 09/28/19.  Disposition Plan:Patient is currently not appropriate for discharge due to ongoing work-up and treatment for multifocal pneumonia and suspected VP shunt infection.  Consultants:   Infectious disease  Interventional radiology  GI  Neurosurgery consulted on 10/27/2019   Procedures:   Lumbar puncture by interventional radiology on  10/17/2019. EGD 10/25/2019.  Objective: Vitals:   10/30/19 1000 10/30/19 1100 10/30/19 1200 10/30/19 1300  BP: (!) 152/88 (!) 141/78 (!) 158/94 (!) 142/82  Pulse:      Resp: (!) 23 16 (!) 28 (!) 23  Temp:   (!) 100.9 F (38.3 C)   TempSrc:   Axillary   SpO2:      Weight:      Height:        Intake/Output Summary (Last 24 hours) at 10/30/2019 1354 Last data filed at 10/30/2019 1300 Gross per 24 hour  Intake 2647.06 ml  Output 2550 ml  Net 97.06 ml   Filed Weights   10/13/19 1003 10/13/19 2006 10/14/19 0237  Weight: 72.6 kg 74.4 kg 74.7 kg    Exam:   General: 33 y.o. year-old female well-developed well-nourished in no acute distress.  Alert..  Cardiovascular: Regular rate and rhythm no rubs or gallops.  Respiratory: Mild rales at bases no wheezing noted.  Poor inspiratory effort.    Abdomen: Distended hypoactive bowel sounds.  Musculoskeletal: Trace lower extremity edema bilaterally.    Psychiatry: Mood is appropriate for condition and setting.  Data Reviewed: CBC: Recent Labs  Lab 10/25/19 0742 10/26/19 0640 10/27/19 0340 10/28/19 0118 10/28/19 1025 10/29/19 0612 10/30/19 0441  WBC 20.4* 18.9* 11.5* 10.1  --  16.6* 14.5*  NEUTROABS 17.4* 15.5* 9.1*  --   --   --   --   HGB 12.4 11.8* 9.4* 9.4*  --  9.9* 9.8*  HCT 34.3* 32.6* 25.9* 25.8* 28.8* 27.7* 27.7*  MCV 79.0* 80.1 79.4* 79.6*  --  79.8* 80.5  PLT 225 325 286 269  --  339 347   Basic Metabolic Panel: Recent Labs  Lab 10/25/19 0742 10/27/19 0340 10/28/19 0118 10/28/19 1025 10/28/19 1330 10/29/19 0612 10/30/19 0441  NA 136 139 137  --   --  138 140  K 4.0 3.1* 3.2*  --  3.9 3.7 3.3*  CL 102 105 105  --   --  107 110  CO2 24 23 22   --   --  21* 22  GLUCOSE 115* 136* 146*  --   --  131* 107*  BUN 6 <5* 6  --   --  5* <5*  CREATININE 0.52 0.44 0.49  --   --  0.57 0.59  CALCIUM 8.5* 8.2* 8.4*  --   --  8.2* 8.2*  MG  --   --   --  2.4  --   --   --    GFR: Estimated Creatinine Clearance:  99 mL/min (by C-G formula based on SCr of 0.59 mg/dL). Liver Function Tests: Recent Labs  Lab 10/24/19 0341 10/25/19 0742 10/27/19 0340 10/28/19 0118 10/30/19 0441  AST 16 17 14* 22 20  ALT 37 29 16 25 28   ALKPHOS 82 78 50 52 55  BILITOT 0.5 0.8 1.1 1.1 0.6  PROT 6.8 6.2* 6.1* 6.0* 5.8*  ALBUMIN 3.1* 2.7* 3.7 3.3* 2.8*   Recent Labs  Lab 10/25/19 0742  LIPASE 20   No results for input(s): AMMONIA in the last 168 hours. Coagulation Profile: No results for input(s): INR, PROTIME in the last 168 hours. Cardiac Enzymes: No results for input(s): CKTOTAL, CKMB, CKMBINDEX, TROPONINI in the last 168 hours. BNP (last 3 results) No results for input(s): PROBNP in the last 8760 hours. HbA1C: No results for input(s): HGBA1C in the last 72 hours. CBG: No results for input(s): GLUCAP in the last 168 hours. Lipid Profile: No results for input(s): CHOL, HDL, LDLCALC, TRIG, CHOLHDL, LDLDIRECT in the last 72 hours. Thyroid Function Tests: Recent Labs    10/28/19 1025  TSH 1.057   Anemia Panel: Recent Labs    10/28/19 1025  VITAMINB12 185  FERRITIN 187  TIBC 115*  IRON 18*  RETICCTPCT 1.0   Urine analysis:    Component Value Date/Time   COLORURINE AMBER (A) 10/27/2019 1429   APPEARANCEUR HAZY (A) 10/27/2019 1429   LABSPEC 1.036 (H) 10/27/2019 1429   PHURINE 5.0 10/27/2019 1429   GLUCOSEU NEGATIVE 10/27/2019 1429   HGBUR LARGE (A) 10/27/2019 1429   BILIRUBINUR NEGATIVE 10/27/2019 1429   KETONESUR NEGATIVE 10/27/2019 1429   PROTEINUR 100 (A) 10/27/2019 1429   UROBILINOGEN 1.0 01/05/2013 2116   NITRITE NEGATIVE 10/27/2019 1429   LEUKOCYTESUR NEGATIVE 10/27/2019 1429   Sepsis Labs: @LABRCNTIP (procalcitonin:4,lacticidven:4)  ) Recent Results (from the past 240 hour(s))  Culture, blood (routine x 2)     Status: None   Collection Time: 10/25/19  7:34 AM   Specimen: BLOOD LEFT HAND  Result Value Ref Range Status   Specimen Description BLOOD LEFT HAND  Final   Special  Requests   Final    BOTTLES DRAWN AEROBIC ONLY Blood Culture results may not be optimal due to an inadequate volume of blood received in culture bottles   Culture   Final    NO GROWTH 5 DAYS Performed at [facility], [address] [place], [place] [id number]    Report Status 10/30/2019 FINAL  Final  Culture, blood (routine x 2)     Status:  None   Collection Time: 10/25/19  7:25 PM   Specimen: Site Not Specified; Blood  Result Value Ref Range Status   Specimen Description SITE NOT SPECIFIED  Final   Special Requests   Final    BOTTLES DRAWN AEROBIC AND ANAEROBIC Blood Culture adequate volume   Culture   Final    NO GROWTH 5 DAYS Performed at [facility], [address], [place], [place] [address]    Report Status 10/30/2019 FINAL  Final  Culture, blood (routine x 2)     Status: None (Preliminary result)   Collection Time: 10/27/19 11:15 PM   Specimen: BLOOD RIGHT HAND  Result Value Ref Range Status   Specimen Description BLOOD RIGHT HAND  Final   Special Requests   Final    AEROBIC BOTTLE ONLY Blood Culture results may not be optimal due to an inadequate volume of blood received in culture bottles   Culture   Final    NO GROWTH 3 DAYS Performed at [facility], [address], [place], [place] [address]    Report Status PENDING  Incomplete  Culture, blood (routine x 2)     Status: None (Preliminary result)   Collection Time: 10/27/19 11:20 PM   Specimen: BLOOD LEFT HAND  Result Value Ref Range Status   Specimen Description BLOOD LEFT HAND  Final   Special Requests AEROBIC BOTTLE ONLY Blood Culture adequate volume  Final   Culture   Final    NO GROWTH 3 DAYS Performed at [facility], [address], [place], [place] [address]    Report Status PENDING  Incomplete  Cath Tip Culture     Status: None (Preliminary result)   Collection Time: 10/28/19  3:39 AM   Specimen: Catheter Tip  Result Value Ref Range Status   Specimen Description CATH TIP   Final   Special Requests NONE  Final   Culture   Final    NO GROWTH 2 DAYS Performed at [facility], [address] [place], [place] [address]    Report Status PENDING  Incomplete  Gastrointestinal Panel by PCR , Stool     Status: None   Collection Time: 10/28/19  1:30 PM   Specimen: Stool  Result Value Ref Range Status   Campylobacter species NOT DETECTED NOT DETECTED Final   Plesimonas shigelloides NOT DETECTED NOT DETECTED Final   Salmonella species NOT DETECTED NOT DETECTED Final   Yersinia enterocolitica NOT DETECTED NOT DETECTED Final   Vibrio species NOT DETECTED NOT DETECTED Final   Vibrio cholerae NOT DETECTED NOT DETECTED Final   Enteroaggregative E coli (EAEC) NOT DETECTED NOT DETECTED Final   Enteropathogenic E coli (EPEC) NOT DETECTED NOT DETECTED Final   Enterotoxigenic E coli (ETEC) NOT DETECTED NOT DETECTED Final   Shiga like toxin producing E coli (STEC) NOT DETECTED NOT DETECTED Final   Shigella/Enteroinvasive E coli (EIEC) NOT DETECTED NOT DETECTED Final   Cryptosporidium NOT DETECTED NOT DETECTED Final   Cyclospora cayetanensis NOT DETECTED NOT DETECTED Final   Entamoeba histolytica NOT DETECTED NOT DETECTED Final   Giardia lamblia NOT DETECTED NOT DETECTED Final   Adenovirus F40/41 NOT DETECTED NOT DETECTED Final   Astrovirus NOT DETECTED NOT DETECTED Final   Norovirus GI/GII NOT DETECTED NOT DETECTED Final   Rotavirus A NOT DETECTED NOT DETECTED Final   Sapovirus (I, II, IV, and V) NOT DETECTED NOT DETECTED Final    Comment: Performed at [facility], [address] [place], [place] [address]  MRSA PCR Screening     Status: None   Collection Time: 10/28/19  1:30 PM   Specimen: Nasal Mucosa; Nasopharyngeal  Result Value Ref Range Status   MRSA by PCR NEGATIVE NEGATIVE Final    Comment:        The GeneXpert MRSA Assay (FDA approved for NASAL specimens only), is one component of a comprehensive MRSA colonization surveillance program. It  is not intended to diagnose MRSA infection nor to guide or monitor treatment for MRSA infections. Performed at [facility], [address], [place], [place] [address]       Studies: Dg Fl Guided Lumbar Puncture  Result Date: 10/30/2019 CLINICAL DATA:  33 year old female with suspicion of infected CSF shunt. Patient currently on a blood thinner regimen due to DVT, which has been appropriately adjusted to allow for this procedure. Cerebral palsy repeat CSF analysis requested. EXAM: DIAGNOSTIC LUMBAR PUNCTURE UNDER FLUOROSCOPIC GUIDANCE COMPARISON:  Fluoroscopic guided lumbar puncture 10/17/2019. CT Abdomen and Pelvis 10/26/2019. FLUOROSCOPY TIME:  Fluoroscopy Time:  0 minutes 12 seconds Radiation Exposure Index (if provided by the fluoroscopic device): Number of Acquired Spot Images: 0 PROCEDURE: Informed consent was obtained from the patient's mother prior to the procedure, including potential complications of headache, allergy, and pain. A "time-out" was performed. With the patient prone, the lower back was prepped with Betadine. 1% Lidocaine was used for local anesthesia. Lumbar puncture was performed at the L2-L3 level using right sub laminar technique and a 3.5 in x 20 gauge needle with return of initially slightly blood tinged but ultimately clear CSF. The thecal sac was just barely within reach using a 3.5 in needle (had to be hubbed and then held in place, and no CSF opening pressure measurement could be obtained. 19.5 ml of CSF were obtained for laboratory studies. Bleeding was minimal. The patient tolerated the procedure well and there were no apparent complications. Appropriate post procedural orders were placed on the chart. The patient was returned to the inpatient floor in stable condition for continued treatment. IMPRESSION: Fluoroscopic guided lumbar puncture at L2-L3. 19.5 mL of CSF obtained for laboratory analysis. Electronically Signed   By: Odessa Fleming M.D.   On: 10/30/2019 12:01     Scheduled Meds:  acetaminophen  500 mg Oral Q6H   Chlorhexidine Gluconate Cloth  6 each Topical Daily   dextromethorphan-guaiFENesin  2 tablet Oral BID   dicyclomine  10 mg Oral TID AC & HS   ferrous sulfate  325 mg Oral Q breakfast   ipratropium-albuterol  3 mL Nebulization BID   metoprolol tartrate  12.5 mg Oral TID    morphine injection  1 mg Intravenous Once   pantoprazole (PROTONIX) IV  40 mg Intravenous Q12H   phenobarbital  97.2 mg Oral QHS   sucralfate  1 g Oral TID WC & HS    Continuous Infusions:  ceFEPime (MAXIPIME) IV 2 g (10/30/19 1324)   dextrose 5 % and 0.9% NaCl 50 mL/hr at 10/30/19 1300   diltiazem (CARDIZEM) infusion 10 mg/hr (10/27/19 1900)   heparin     metronidazole Stopped (10/30/19 1245)   vancomycin       LOS: 16 days     Darlin Drop, MD [facility] Pager [phone]  If 7PM-7AM, please contact night-coverage [email or web address] Password [email or web address] 10/30/2019, 1:54 PM

## 2019-10-31 ENCOUNTER — Inpatient Hospital Stay (HOSPITAL_COMMUNITY): Payer: Medicaid Other

## 2019-10-31 DIAGNOSIS — R1032 Left lower quadrant pain: Secondary | ICD-10-CM

## 2019-10-31 DIAGNOSIS — Z95828 Presence of other vascular implants and grafts: Secondary | ICD-10-CM

## 2019-10-31 DIAGNOSIS — R4182 Altered mental status, unspecified: Secondary | ICD-10-CM

## 2019-10-31 DIAGNOSIS — R197 Diarrhea, unspecified: Secondary | ICD-10-CM

## 2019-10-31 LAB — CBC
HCT: 28.9 % — ABNORMAL LOW (ref 36.0–46.0)
Hemoglobin: 10.3 g/dL — ABNORMAL LOW (ref 12.0–15.0)
MCH: 28.5 pg (ref 26.0–34.0)
MCHC: 35.6 g/dL (ref 30.0–36.0)
MCV: 80.1 fL (ref 80.0–100.0)
Platelets: 355 10*3/uL (ref 150–400)
RBC: 3.61 MIL/uL — ABNORMAL LOW (ref 3.87–5.11)
RDW: 14.2 % (ref 11.5–15.5)
WBC: 16.2 10*3/uL — ABNORMAL HIGH (ref 4.0–10.5)
nRBC: 0 % (ref 0.0–0.2)

## 2019-10-31 LAB — HEPARIN LEVEL (UNFRACTIONATED): Heparin Unfractionated: 0.69 IU/mL (ref 0.30–0.70)

## 2019-10-31 LAB — CYTOLOGY - NON PAP

## 2019-10-31 LAB — APTT: aPTT: 97 seconds — ABNORMAL HIGH (ref 24–36)

## 2019-10-31 LAB — BASIC METABOLIC PANEL
Anion gap: 10 (ref 5–15)
BUN: <5 mg/dL — ABNORMAL LOW (ref 6–20)
CO2: 22 mmol/L (ref 22–32)
Calcium: 8.3 mg/dL — ABNORMAL LOW (ref 8.9–10.3)
Chloride: 109 mmol/L (ref 98–111)
Creatinine, Ser: 0.46 mg/dL (ref 0.44–1.00)
GFR calc Af Amer: >60 mL/min (ref >60)
GFR calc non Af Amer: >60 mL/min (ref >60)
Glucose, Bld: 92 mg/dL (ref 70–99)
Potassium: 3.4 mmol/L — ABNORMAL LOW (ref 3.5–5.1)
Sodium: 141 mmol/L (ref 135–145)

## 2019-10-31 LAB — CATH TIP CULTURE: Culture: NO GROWTH

## 2019-10-31 LAB — PROCALCITONIN: Procalcitonin: 0.25 ng/mL

## 2019-10-31 MED ORDER — SENNOSIDES-DOCUSATE SODIUM 8.6-50 MG PO TABS
2.0000 | ORAL_TABLET | Freq: Every evening | ORAL | Status: DC | PRN
  Administered 2019-11-05: 2 via ORAL
  Filled 2019-10-31 (×2): qty 2

## 2019-10-31 MED ORDER — PANTOPRAZOLE SODIUM 40 MG PO TBEC: 40.0000 mg | DELAYED_RELEASE_TABLET | Freq: Two times a day (BID) | ORAL | Status: DC

## 2019-10-31 MED ORDER — PANTOPRAZOLE SODIUM 40 MG PO PACK
40.0000 mg | PACK | Freq: Every day | ORAL | Status: DC
  Filled 2019-10-31: qty 20

## 2019-10-31 NOTE — Progress Notes (Signed)
Neurosurgery Service Progress Note  Subjective: No acute events overnight  Objective: Vitals:   10/31/19 0500 10/31/19 0600 10/31/19 0700 10/31/19 0800  BP: (!) 147/112 (!) 138/110 (!) 119/98   Pulse: 84 (!) 118 (!) 102   Resp: (!) 26 (!) 33 (!) 21   Temp:    99.4 F (37.4 C)  TempSrc:    Axillary  SpO2: 100% 96% 100%   Weight:      Height:       Temp (24hrs), Avg:99.6 F (37.6 C), Min:98.8 F (37.1 C), Max:100.9 F (38.3 C)  CBC Latest Ref Rng & Units 10/31/2019 10/30/2019 10/29/2019  WBC 4.0 - 10.5 K/uL 16.2(H) 14.5(H) 16.6(H)  Hemoglobin 12.0 - 15.0 g/dL 10.3(L) 9.8(L) 9.9(L)  Hematocrit 36.0 - 46.0 % 28.9(L) 27.7(L) 27.7(L)  Platelets 150 - 400 K/uL 355 347 339   BMP Latest Ref Rng & Units 10/31/2019 10/30/2019 10/29/2019  Glucose 70 - 99 mg/dL 92 107(H) 131(H)  BUN 6 - 20 mg/dL <5(L) <5(L) 5(L)  Creatinine 0.44 - 1.00 mg/dL 0.46 0.59 0.57  Sodium 135 - 145 mmol/L 141 140 138  Potassium 3.5 - 5.1 mmol/L 3.4(L) 3.3(L) 3.7  Chloride 98 - 111 mmol/L 109 110 107  CO2 22 - 32 mmol/L 22 22 21(L)  Calcium 8.9 - 10.3 mg/dL 8.3(L) 8.2(L) 8.2(L)    Intake/Output Summary (Last 24 hours) at 10/31/2019 0941 Last data filed at 10/31/2019 0700 Gross per 24 hour  Intake 2715.14 ml  Output 1300 ml  Net 1415.14 ml    Current Facility-Administered Medications:  .  acetaminophen (TYLENOL) suppository 650 mg, 650 mg, Rectal, Q8H PRN, Blount, Xenia T, NP, 650 mg at 10/28/19 0315 .  acetaminophen (TYLENOL) tablet 500 mg, 500 mg, Oral, Q6H, Hall, Carole N, DO, 500 mg at 10/31/19 0620 .  acetaminophen (TYLENOL) tablet 650 mg, 650 mg, Oral, Q6H PRN, Neena Rhymes, MD, 650 mg at 10/30/19 0959 .  bisacodyl (DULCOLAX) suppository 10 mg, 10 mg, Rectal, Daily PRN, [name] Pap N, DO, 10 mg at 10/23/19 0418 .  ceFEPIme (MAXIPIME) 2 g in sodium chloride 0.9 % 100 mL IVPB, 2 g, Intravenous, Q8H, Tyrone Apple, RPH, Stopped at 10/31/19 0451 .  Chlorhexidine Gluconate Cloth 2 % PADS 6 each, 6 each,  Topical, Daily, Kayleen Memos, DO, 6 each at 10/30/19 1619 .  dextromethorphan-guaiFENesin (MUCINEX DM) 30-600 MG per 12 hr tablet 2 tablet, 2 tablet, Oral, BID, Kayleen Memos, DO, 2 tablet at 10/30/19 2221 .  dextrose 5 %-0.9 % sodium chloride infusion, , Intravenous, Continuous, Hall, Carole N, DO, Last Rate: 50 mL/hr at 10/31/19 0700 .  dicyclomine (BENTYL) capsule 10 mg, 10 mg, Oral, TID AC & HS, Vena Rua, PA-C, 10 mg at 10/31/19 8101 .  diltiazem (CARDIZEM) 125 mg in dextrose 5% 125 mL (1 mg/mL) infusion, 5-15 mg/hr, Intravenous, Titrated, Hall, Carole N, DO, Last Rate: 10 mL/hr at 10/27/19 1900, 10 mg/hr at 10/27/19 1900 .  diphenhydrAMINE (BENADRYL) capsule 25 mg, 25 mg, Oral, Q6H PRN, Hall, Carole N, DO, 25 mg at 10/27/19 1200 .  ferrous sulfate tablet 325 mg, 325 mg, Oral, Q breakfast, Hall, Carole N, DO, 325 mg at 10/30/19 1013 .  heparin ADULT infusion 100 units/mL (25000 units/234mL sodium chloride 0.45%), 1,050 Units/hr, Intravenous, Continuous, Dang, Thuy D, RPH, Last Rate: 10.5 mL/hr at 10/31/19 0700, 1,050 Units/hr at 10/31/19 0700 .  labetalol (NORMODYNE) injection 5 mg, 5 mg, Intravenous, Q2H PRN, [name] Pap N, DO, 5 mg at 10/29/19  2309 .  levalbuterol (XOPENEX HFA) inhaler 1 puff, 1 puff, Inhalation, Q8H PRN, Hall, Carole N, DO .  lidocaine (XYLOCAINE) 1 % (with pres) injection, , Infiltration, PRN, Bruning, Kevin, PA-C, 5 mL at 10/25/19 1236 .  metoprolol tartrate (LOPRESSOR) tablet 12.5 mg, 12.5 mg, Oral, TID, Hall, Carole N, DO, 12.5 mg at 10/30/19 2221 .  metroNIDAZOLE (FLAGYL) IVPB 500 mg, 500 mg, Intravenous, Q8H, Cliffton Asters, MD, Stopped at 10/31/19 0236 .  morphine 2 MG/ML injection 1 mg, 1 mg, Intravenous, Q4H PRN, Hall, Carole N, DO, 1 mg at 10/30/19 1537 .  morphine 2 MG/ML injection 1 mg, 1 mg, Intravenous, Once, [name], Belinda K, NP .  ondansetron (ZOFRAN) tablet 4 mg, 4 mg, Oral, Q6H PRN **OR** ondansetron (ZOFRAN) injection 4 mg, 4 mg, Intravenous, Q6H  PRN, Charlsie Quest, MD, 4 mg at 10/25/19 1937 .  pantoprazole (PROTONIX) injection 40 mg, 40 mg, Intravenous, Q12H, Darlin Drop, DO, 40 mg at 10/30/19 2219 .  PHENobarbital (LUMINAL) tablet 97.2 mg, 97.2 mg, Oral, QHS, Levie Heritage, DO, 97.2 mg at 10/30/19 2221 .  polyethylene glycol (MIRALAX / GLYCOLAX) packet 17 g, 17 g, Oral, Daily PRN, Hall, Carole N, DO .  senna-docusate (Senokot-S) tablet 2 tablet, 2 tablet, Oral, QHS PRN, Amin, Ankit Chirag, MD .  sodium chloride flush (NS) 0.9 % injection 10-40 mL, 10-40 mL, Intracatheter, PRN, Amin, Ankit Chirag, MD .  sucralfate (CARAFATE) 1 GM/10ML suspension 1 g, 1 g, Oral, TID WC & HS, Hall, Carole N, DO, 1 g at 10/30/19 2221 .  vancomycin (VANCOCIN) IVPB 750 mg/150 ml premix, 750 mg, Intravenous, Q8H, Gerilyn Nestle, RPH, Stopped at 10/31/19 0405   Physical Exam: Awake/alert, answers yes/no to some questions, spastic quadriparesis Abdomen appears mildly distended, hypertympanic, endorsed tenderness today but she endorsed tenderness wherever I asked - arms / legs, etc. No guarding on exam, abdomen appears benign  Assessment & Plan: 33 y.o. woman w/ h/o MRCP, shunted hydrocephalus, admitted for abdominal pain and fevers, prolonged course w/ LP w/ pleocytosis w/o positive culture, DVT, then RLL PNA. Initial CT A/P showed normal trace fluid 2/2 CSF drainage, but repeat CT A/P showed possible catheter tip pseudocyst with some reactive changes. Last VPS revision was 20+ years ago. Proximal catheter in interhemispheric space. 11/4 rpt LP w/ no OP, CSF shows 61W 1R TP80 G64, 92% lymphs   -shunt clinically & radiographically appears to be working, I do not think we have to externalize her at this point. Appears to be on day 4 of broad spectrum ABx. CSF Cx are pending. I recommend repeating the CT abdomen/pelvis on day 7 of ABx and see if the cyst has changed. Given that the cyst appeared in between scans 14d apart, my suspicion is that it should continue  to enlarge if it is not responding to ABx. Unfortunately, she has a somewhat unreliable abdominal exam, but neurologically she does not appear to have her typical shunt malfunction symptoms.  Clovis Pu Sloane Junkin  10/31/19 9:41 AM

## 2019-10-31 NOTE — Progress Notes (Signed)
ANTICOAGULATION CONSULT NOTE  Pharmacy Consult :  Heparin Indication: DVT   No Active Allergies  Patient Measurements: Height: 5\' 4"  (162.6 cm) Weight: 164 lb 10.9 oz (74.7 kg) IBW/kg (Calculated) : 54.7 Heparin Dosing Weight: 70kg  Vital Signs: Temp: 99.4 F (37.4 C) (11/04 0800) Temp Source: Axillary (11/04 0800) BP: 119/98 (11/04 0700) Pulse Rate: 102 (11/04 0700)  Labs: Recent Labs    10/29/19 0612 10/29/19 0630 10/29/19 1115 10/30/19 0441 10/30/19 0626 10/31/19 0500 10/31/19 0655  HGB 9.9*  --   --  9.8*  --  10.3*  --   HCT 27.7*  --   --  27.7*  --  28.9*  --   PLT 339  --   --  347  --  355  --   APTT  --  81*  --   --  97*  --  97*  HEPARINUNFRC  --   --  1.20*  --  0.97*  --  0.69  CREATININE 0.57  --   --  0.59  --  0.46  --     Estimated Creatinine Clearance: 99 mL/min (by C-G formula based on SCr of 0.46 mg/dL).   Assessment: 54 YOF with acute DVT started on lovenox and transitioned to Eliquis.  Now with possible VP shunt infection and transitioned to heparin for procedures.  Last Eliquis administered 10/31 at 2250.  Heparin level trending down to therapeutic level.  APTT is therapeutic as well.  Heparin level and aPTT are starting to correlate.  No bleeding reported.  Goal of Therapy:  Heparin level 0.3-0.7 units/ml aPTT 66-102 seconds Monitor platelets by anticoagulation protocol: Yes    Plan:  Reduce heparin gtt slightly to 1000 units/hr Daily aPTT, heparin level and CBC F/U with switching back to [facility] D. [name], PharmD, BCPS, [place] 10/31/2019, 10:17 AM

## 2019-10-31 NOTE — Progress Notes (Signed)
PROGRESS NOTE    Tracey Graham  KGU:[id number] DOB: [birth year] DOA: 10/13/2019 PCP: Clinic, General Medical   Brief Narrative:  33 year old with history of cerebral palsy, wheelchair-bound/fully dependent on ADL S, VP shunt for hydrocephalus, seizure disorder, intellectual disability came to the ER with complaints of fever abdominal pain.  Initial SIRS, COVID-19-negative.  CT of the abdomen pelvis was overall negative.  CT head-negative.  Work-up showed left lower extremity acute DVT started on anticoagulation.  LP initially performed 10/21 which showed pleocytosis, elevated protein and lymphocytic predominance without any bacteria.  Initial thoughts were DVT causing her fevers.  Aseptic meningitis-treated with 3 days of Decadron.  Course further complicated by abdominal pain with GI consultation.  EGD on 10/29 showed esophagitis and gastric ulcers for which GI recommended PPI twice daily.  No biopsies were taken as patient was on Eliquis.  Repeat CT of the abdomen pelvis with contrast showed loculated fluid collection at the tip of the VP shunt raising concerns for VP shunt infection.  Neurosurgery consulted, repeat LP performed on 10/30/2019.  Transferred to the ICU for further work-up.   Assessment & Plan:   Principal Problem:   Infection of VP (ventriculoperitoneal) shunt (HCC) Active Problems:   Hydrocephalus (HCC)   VP (ventriculoperitoneal) shunt status   Encephalopathy   Abdominal pain   Nuchal rigidity   Aseptic meningitis   Constipation   Deep vein thrombosis (DVT) of femoral vein of left lower extremity (HCC)  Sepsis of unclear etiology, could be multiple reasons. Recurrent fevers, unknown etiology Suspicion for multifocal pneumonia versus VP shunt infection -Infection vs DVT vs autonomic dysfunct? Cultures remain negative, GI panel negative.  CT of the abdomen pelvis with contrast on 10/30 showed loculated fluid around tip of VP shunt in the left anterior pelvis.   Neurosurgery following. -Initial LP 10/17/2019-unrevealing.  Repeat LP 10/30/2019-pending.  No concerns for infection at the moment. -Repeat CT of the abdomen pelvis 11/03/2019.  Acute hypoxic respiratory failure secondary to multifocal pneumonia -Chest x-ray showed bilateral pulmonary infiltrates at the bases. -Currently on IV vancomycin, cefepime, Flagyl -Infectious disease team following. -Aggressive bronchodilators.  Incentive spirometer and flutter valve if she uses it.  Abdominal discomfort, nonspecific -Diminished bowel sounds.  Possible could be developing ileus, will obtain abdominal x-ray.  Out of bed to chair  Persistent sinus tachycardia with uncontrolled elevated blood pressure -No known history of essential hypertension -P.o. Lopressor 12.5 mg 3 times daily.  IV labetalol.  Fluid  Esophagitis and nonbleeding gastric ulcers -PPI twice daily.  Carafate 4 times daily.  Acute left lower extremity DVT -Continue anticoagulation.  Iron deficiency anemia -Iron supplements.  Bowel regimen.  Dysphagia -Aspiration precaution  Seizure disorder -Continue AEDs.  DVT prophylaxis: Heparin drip Code Status: Full code Family Communication: Mother at bedside Disposition Plan: Maintain hospital stay evaluation sepsis  Consultants:   Neurosurgery  Infectious disease   Subjective: Seen and examined at bedside.  Mother at bedside.  Patient reporting of abdominal discomfort.  Overall mother tells me she is little more conversational  Review of Systems Otherwise negative except as per HPI, including: Difficult to obtain from her  Objective: Vitals:   10/31/19 0400 10/31/19 0500 10/31/19 0600 10/31/19 0700  BP: (!) 142/108 (!) 147/112 (!) 138/110 (!) 119/98  Pulse: 88 84 (!) 118 (!) 102  Resp: (!) 27 (!) 26 (!) 33 (!) 21  Temp: 98.8 F (37.1 C)     TempSrc: Axillary     SpO2: 100% 100% 96% 100%  Weight:  Height:        Intake/Output Summary (Last 24 hours) at  10/31/2019 0809 Last data filed at 10/31/2019 0700 Gross per 24 hour  Intake 2715.14 ml  Output 1300 ml  Net 1415.14 ml   Filed Weights   10/13/19 1003 10/13/19 2006 10/14/19 0237  Weight: 72.6 kg 74.4 kg 74.7 kg    Examination:  General exam: Slight distress due to abdominal discomfort Respiratory system: Diminished at bases Cardiovascular system: S1 & S2 heard, RRR. No JVD, murmurs, rubs, gallops or clicks. No pedal edema. Gastrointestinal system: Abdomen mildly distended.  Diminished bowel sounds. Central nervous system: Alert to her name only which is at baseline Extremities: Contracted upper extremities Skin: No rashes, lesions or ulcers Psychiatry: Poor judgment and insight  External urinary catheter Right neck triple-lumen catheter 10/27/2019.  Data Reviewed:   CBC: Recent Labs  Lab 10/25/19 0742 10/26/19 0640 10/27/19 0340 10/28/19 0118 10/28/19 1025 10/29/19 0612 10/30/19 0441 10/31/19 0500  WBC 20.4* 18.9* 11.5* 10.1  --  16.6* 14.5* 16.2*  NEUTROABS 17.4* 15.5* 9.1*  --   --   --   --   --   HGB 12.4 11.8* 9.4* 9.4*  --  9.9* 9.8* 10.3*  HCT 34.3* 32.6* 25.9* 25.8* 28.8* 27.7* 27.7* 28.9*  MCV 79.0* 80.1 79.4* 79.6*  --  79.8* 80.5 80.1  PLT 225 325 286 269  --  339 347 782   Basic Metabolic Panel: Recent Labs  Lab 10/27/19 0340 10/28/19 0118 10/28/19 1025 10/28/19 1330 10/29/19 0612 10/30/19 0441 10/31/19 0500  NA 139 137  --   --  138 140 141  K 3.1* 3.2*  --  3.9 3.7 3.3* 3.4*  CL 105 105  --   --  107 110 109  CO2 23 22  --   --  21* 22 22  GLUCOSE 136* 146*  --   --  131* 107* 92  BUN <5* 6  --   --  5* <5* <5*  CREATININE 0.44 0.49  --   --  0.57 0.59 0.46  CALCIUM 8.2* 8.4*  --   --  8.2* 8.2* 8.3*  MG  --   --  2.4  --   --   --   --    GFR: Estimated Creatinine Clearance: 99 mL/min (by C-G formula based on SCr of 0.46 mg/dL). Liver Function Tests: Recent Labs  Lab 10/25/19 0742 10/27/19 0340 10/28/19 0118 10/30/19 0441  AST  17 14* 22 20  ALT 29 16 25 28   ALKPHOS 78 50 52 55  BILITOT 0.8 1.1 1.1 0.6  PROT 6.2* 6.1* 6.0* 5.8*  ALBUMIN 2.7* 3.7 3.3* 2.8*   Recent Labs  Lab 10/25/19 0742  LIPASE 20   No results for input(s): AMMONIA in the last 168 hours. Coagulation Profile: No results for input(s): INR, PROTIME in the last 168 hours. Cardiac Enzymes: No results for input(s): CKTOTAL, CKMB, CKMBINDEX, TROPONINI in the last 168 hours. BNP (last 3 results) No results for input(s): PROBNP in the last 8760 hours. HbA1C: No results for input(s): HGBA1C in the last 72 hours. CBG: No results for input(s): GLUCAP in the last 168 hours. Lipid Profile: No results for input(s): CHOL, HDL, LDLCALC, TRIG, CHOLHDL, LDLDIRECT in the last 72 hours. Thyroid Function Tests: Recent Labs    10/28/19 1025  TSH 1.057   Anemia Panel: Recent Labs    10/28/19 1025  VITAMINB12 185  FERRITIN 187  TIBC 115*  IRON 18*  RETICCTPCT  1.0   Sepsis Labs: Recent Labs  Lab 10/25/19 0742 10/26/19 0541 10/26/19 1845 10/26/19 2145 10/28/19 0118 10/30/19 0441 10/31/19 0500  PROCALCITON 7.00 5.22  --   --   --  0.52 0.25  LATICACIDVEN 1.1  --  0.7 0.8 0.5 1.2  --     Recent Results (from the past 240 hour(s))  Culture, blood (routine x 2)     Status: None   Collection Time: 10/25/19  7:34 AM   Specimen: BLOOD LEFT HAND  Result Value Ref Range Status   Specimen Description BLOOD LEFT HAND  Final   Special Requests   Final    BOTTLES DRAWN AEROBIC ONLY Blood Culture results may not be optimal due to an inadequate volume of blood received in culture bottles   Culture   Final    NO GROWTH 5 DAYS Performed at [facility], [address] [place], [place] [address]    Report Status 10/30/2019 FINAL  Final  Culture, blood (routine x 2)     Status: None   Collection Time: 10/25/19  7:25 PM   Specimen: Site Not Specified; Blood  Result Value Ref Range Status   Specimen Description SITE NOT SPECIFIED  Final    Special Requests   Final    BOTTLES DRAWN AEROBIC AND ANAEROBIC Blood Culture adequate volume   Culture   Final    NO GROWTH 5 DAYS Performed at [facility], [address] [place], [place] [address]    Report Status 10/30/2019 FINAL  Final  Culture, blood (routine x 2)     Status: None (Preliminary result)   Collection Time: 10/27/19 11:15 PM   Specimen: BLOOD RIGHT HAND  Result Value Ref Range Status   Specimen Description BLOOD RIGHT HAND  Final   Special Requests   Final    AEROBIC BOTTLE ONLY Blood Culture results may not be optimal due to an inadequate volume of blood received in culture bottles   Culture   Final    NO GROWTH 3 DAYS Performed at [facility], [address] [place], [place] [address]    Report Status PENDING  Incomplete  Culture, blood (routine x 2)     Status: None (Preliminary result)   Collection Time: 10/27/19 11:20 PM   Specimen: BLOOD LEFT HAND  Result Value Ref Range Status   Specimen Description BLOOD LEFT HAND  Final   Special Requests AEROBIC BOTTLE ONLY Blood Culture adequate volume  Final   Culture   Final    NO GROWTH 3 DAYS Performed at [facility], [address], [place], [place] [address]    Report Status PENDING  Incomplete  Cath Tip Culture     Status: None (Preliminary result)   Collection Time: 10/28/19  3:39 AM   Specimen: Catheter Tip  Result Value Ref Range Status   Specimen Description CATH TIP  Final   Special Requests NONE  Final   Culture   Final    NO GROWTH 2 DAYS Performed at [facility], [address] [place], [place] [address]    Report Status PENDING  Incomplete  Gastrointestinal Panel by PCR , Stool     Status: None   Collection Time: 10/28/19  1:30 PM   Specimen: Stool  Result Value Ref Range Status   Campylobacter species NOT DETECTED NOT DETECTED Final   Plesimonas shigelloides NOT DETECTED NOT DETECTED Final   Salmonella species NOT DETECTED NOT DETECTED Final  Yersinia  enterocolitica NOT DETECTED NOT DETECTED Final   Vibrio species NOT DETECTED NOT DETECTED Final   Vibrio cholerae NOT DETECTED NOT DETECTED Final   Enteroaggregative E coli (EAEC) NOT DETECTED NOT DETECTED Final   Enteropathogenic E coli (EPEC) NOT DETECTED NOT DETECTED Final   Enterotoxigenic E coli (ETEC) NOT DETECTED NOT DETECTED Final   Shiga like toxin producing E coli (STEC) NOT DETECTED NOT DETECTED Final   Shigella/Enteroinvasive E coli (EIEC) NOT DETECTED NOT DETECTED Final   Cryptosporidium NOT DETECTED NOT DETECTED Final   Cyclospora cayetanensis NOT DETECTED NOT DETECTED Final   Entamoeba histolytica NOT DETECTED NOT DETECTED Final   Giardia lamblia NOT DETECTED NOT DETECTED Final   Adenovirus F40/41 NOT DETECTED NOT DETECTED Final   Astrovirus NOT DETECTED NOT DETECTED Final   Norovirus GI/GII NOT DETECTED NOT DETECTED Final   Rotavirus A NOT DETECTED NOT DETECTED Final   Sapovirus (I, II, IV, and V) NOT DETECTED NOT DETECTED Final    Comment: Performed at [facility], [address] [place], [place] [address]  MRSA PCR Screening     Status: None   Collection Time: 10/28/19  1:30 PM   Specimen: Nasal Mucosa; Nasopharyngeal  Result Value Ref Range Status   MRSA by PCR NEGATIVE NEGATIVE Final    Comment:        The GeneXpert MRSA Assay (FDA approved for NASAL specimens only), is one component of a comprehensive MRSA colonization surveillance program. It is not intended to diagnose MRSA infection nor to guide or monitor treatment for MRSA infections. Performed at [facility]  Lab, [address] [place], [place] [address]          Radiology Studies: Dg Fl Guided Lumbar Puncture  Result Date: 10/30/2019 CLINICAL DATA:  33 year old female with suspicion of infected CSF shunt. Patient currently on a blood thinner regimen due to DVT, which has been appropriately adjusted to allow for this procedure. Cerebral palsy repeat CSF analysis requested. EXAM:  DIAGNOSTIC LUMBAR PUNCTURE UNDER FLUOROSCOPIC GUIDANCE COMPARISON:  Fluoroscopic guided lumbar puncture 10/17/2019. CT Abdomen and Pelvis 10/26/2019. FLUOROSCOPY TIME:  Fluoroscopy Time:  0 minutes 12 seconds Radiation Exposure Index (if provided by the fluoroscopic device): Number of Acquired Spot Images: 0 PROCEDURE: Informed consent was obtained from the patient's mother prior to the procedure, including potential complications of headache, allergy, and pain. A "time-out" was performed. With the patient prone, the lower back was prepped with Betadine. 1% Lidocaine was used for local anesthesia. Lumbar puncture was performed at the L2-L3 level using right sub laminar technique and a 3.5 in x 20 gauge needle with return of initially slightly blood tinged but ultimately clear CSF. The thecal sac was just barely within reach using a 3.5 in needle (had to be hubbed and then held in place, and no CSF opening pressure measurement could be obtained. 19.5 ml of CSF were obtained for laboratory studies. Bleeding was minimal. The patient tolerated the procedure well and there were no apparent complications. Appropriate post procedural orders were placed on the chart. The patient was returned to the inpatient floor in stable condition for continued treatment. IMPRESSION: Fluoroscopic guided lumbar puncture at L2-L3. 19.5 mL of CSF obtained for laboratory analysis. Electronically Signed   By: Odessa FlemingH  Hall M.D.   On: 10/30/2019 12:01        Scheduled Meds: . acetaminophen  500 mg Oral Q6H  . Chlorhexidine Gluconate Cloth  6 each Topical Daily  . dextromethorphan-guaiFENesin  2 tablet Oral BID  .  dicyclomine  10 mg Oral TID AC & HS  . ferrous sulfate  325 mg Oral Q breakfast  . metoprolol tartrate  12.5 mg Oral TID  .  morphine injection  1 mg Intravenous Once  . pantoprazole (PROTONIX) IV  40 mg Intravenous Q12H  . phenobarbital  97.2 mg Oral QHS  . sucralfate  1 g Oral TID WC & HS   Continuous Infusions: .  ceFEPime (MAXIPIME) IV Stopped (10/31/19 0451)  . dextrose 5 % and 0.9% NaCl 50 mL/hr at 10/31/19 0700  . diltiazem (CARDIZEM) infusion 10 mg/hr (10/27/19 1900)  . heparin 1,050 Units/hr (10/31/19 0700)  . metronidazole Stopped (10/31/19 0236)  . vancomycin Stopped (10/31/19 0405)     LOS: 17 days   Time spent= 35 mins     Joline Maxcy, MD [facility]  If 7PM-7AM, please contact night-coverage  10/31/2019, 8:09 AM

## 2019-10-31 NOTE — Progress Notes (Signed)
Modified Barium Swallow Progress Note  Patient Details  Name: Tracey Graham MRN: [id number] Date of Birth: [birth year]  Today's Date: 10/31/2019  Modified Barium Swallow completed.  Full report located under Chart Review in the Imaging Section.  Brief recommendations include the following:  Clinical Impression  Pt joined by mother who assisted in feeding during MBS in order to ease pt anxiety and support PO intake as pt demonstrated aversion to barrium trials. Pt exhibited oropharyngeal dysphagia with mild risk of aspiration and was observed with thin (straw), puree, and regular solids. Cup presentation of thins not accepted. Thin via straw had decreased bolus cohesion, delayed swallow initiation and epiglottic inversion resulting in 1x instance of penetration above VF before swallow which was cleared with swallow. With puree pt had decreased bolus cohesion, lingual pumping, and delayed oral transit and swallow initiation. Pt initially refused solid presentations but once it was within oral cavity pt accepted and masticated it with reduced rotary chewing and decreased bolus cohesion prior to swallow. Overall, pts oral phase and feeding difficulties are the main barriers to proceeding with regular diet. Pharyngeal phase significant for delayed swallow initiation, although once swallow is triggered it is well coordinated with no residue. Suspect pts penetration with thin due to pts aversion resulting in verbalizations and delayed swallow. Instances of gagging and coughing with absence of bolus going beyond valleculae present. Recommend continue with current diet of Dys 3 (soft) and thin liquids (straw or cup), meds crushed in puree. Education provided to mother on aspiration precautions and she agrees with current POC. SLP will not follow for swallowing treatment.    Swallow Evaluation Recommendations       SLP Diet Recommendations: Dysphagia 3 (Mech soft) solids;Thin liquid(ordered as "soft)   Liquid Administration via: Cup;Straw   Medication Administration: Crushed with puree   Supervision: Full assist for feeding;Full supervision/cueing for compensatory strategies;Staff to assist with self feeding   Compensations: Minimize environmental distractions;Slow rate;Small sips/bites;Lingual sweep for clearance of pocketing   Postural Changes: Seated upright at 90 degrees   Oral Care Recommendations: Oral care BID;Staff/trained caregiver to provide oral care        [name] 10/31/2019,9:01 PM   [name] Colvin Caroli.Ed Risk analyst [phone] Office [phone]

## 2019-10-31 NOTE — Progress Notes (Signed)
  Speech Language Pathology    Patient Details Name: Tracey Graham MRN: [id number] DOB: [birth year] Today's Date: 10/31/2019 Time: 6073-         SLP familiar with pt from consult and assessment earlier this admission. At that time CXR was clear, aspiration was not suspected during evaluation and subsequent follow up sessions (see notes for details). Reconsult for swallowing possibly due to infiltrates on repeat CXR 10/30. Discussed with mom who reports Tracey Graham has been accepting po's. Pt  appeared brighter and smiled during encounter. Formal assessment with MBS is now recommended to fully assess oropharyngeal phase. Scheduled today at 1300.               [name] 10/31/2019, 9:53 AM  [name] Colvin Caroli.Ed Risk analyst [phone] Office [phone]

## 2019-10-31 NOTE — Progress Notes (Signed)
[facility]  Date of Admission:  10/13/2019      Total days of antibiotics  6  Day 5 Vancomycin  Day 5 metronidazole   Day 5 cefepime    ASSESSMENT: Tracey Graham is a 33 y.o. female with h/o CP, hydrocephalus and VP shunt admitted from home with sudden onset fevers and abdominal pain.   Distal VP shunt cyst vs abscess - Overall improving on antibiotic therapy; clinically she appears more comfortable and now taking more in PO. She is also more alert. Agree with Dr. Toniann Fail to consider repeat abdominal CT w/ contrast early next week to follow up findings. No indications for shunt externalization and appears to be functioning well.   Continue current antimicrobials for now. LP reviewed and essentially the same as prior study with lymphocytic pleocytosis and no growth on culture; in the context of having had antibiotics prior to the study it may cause this to have false negative, however.   Fevers - improving curve overall on broad spectrum antibiotic therapy for presumed distal intra-abdominal VP shunt infection. WBC remains about the same 16K today.   H/O PCN allergy - she has had multiple uses of amoxicillin documented in medical record; has tolerated all antibiotics during admission including unasyn.  Brief Diarrhea - GI pathogen panel negative; likely related to stool softeners +/- EGD flushes during procedure. D/C contact precuations   Abdominal Pain - erosive gastritis vs constipation vs intraabdominal fluid collection. Work up per primary team. She does appear to be improving.    PLAN: 1. Continue cefepime + vancomycin + metronidazole  2. Repeat abdominal CT early next week to follow abscess/cyst 3. Follow fever curve 4. D/C enteric precautions.     Principal Problem:   Infection of VP (ventriculoperitoneal) shunt (HCC) Active Problems:   Hydrocephalus (HCC)   VP (ventriculoperitoneal) shunt status   Encephalopathy  Abdominal pain   Nuchal rigidity   Aseptic meningitis   Constipation   Deep vein thrombosis (DVT) of femoral vein of left lower extremity (HCC)   . acetaminophen  500 mg Oral Q6H  . Chlorhexidine Gluconate Cloth  6 each Topical Daily  . dextromethorphan-guaiFENesin  2 tablet Oral BID  . dicyclomine  10 mg Oral TID AC & HS  . ferrous sulfate  325 mg Oral Q breakfast  . metoprolol tartrate  12.5 mg Oral TID  .  morphine injection  1 mg Intravenous Once  . pantoprazole  40 mg Oral BID  . phenobarbital  97.2 mg Oral QHS  . sucralfate  1 g Oral TID WC & HS    SUBJECTIVE: Patient is resting comfortably in bed. Making eye contact well. No vocalizations during encounter. Mom is at the bedside readying the breakfast tray. States that [name] is starting to eat and drink more but still has abdominal pain. KUB in process. She is also still receiving treatment for erosive gastritis as noted on EGD.    Review of Systems: Review of Systems  Unable to perform ROS: Medical condition    No Active Allergies  OBJECTIVE: Vitals:   10/31/19 0500 10/31/19 0600 10/31/19 0700 10/31/19 0800  BP: (!) 147/112 (!) 138/110 (!) 119/98   Pulse: 84 (!) 118 (!) 102   Resp: (!) 26 (!) 33 (!) 21   Temp:    99.4 F (37.4 C)  TempSrc:    Axillary  SpO2: 100% 96% 100%   Weight:      Height:  Body mass index is 28.27 kg/m.  Physical Exam Vitals signs and nursing note reviewed.  Constitutional:      Comments: Crying out for mom in bed. Appears very uncomfortable. Bearing down and snorting sounds with rapid breathing at times.   Neck:     Musculoskeletal: Full passive range of motion without pain.     Comments: Moving neck side to side and up and down freely, not to command Cardiovascular:     Rate and Rhythm: Regular rhythm. Tachycardia present.     Heart sounds: No murmur.  Pulmonary:     Effort: Pulmonary effort is normal.     Breath sounds: Normal breath sounds.     Comments: Difficult to  meaningfully assess with patient's vocalizations Abdominal:     General: Bowel sounds are normal.     Tenderness: There is generalized abdominal tenderness and tenderness in the left lower quadrant.  Skin:    General: Skin is warm and dry.     Capillary Refill: Capillary refill takes less than 2 seconds.     Comments: PICC clean and dry   Neurological:     Mental Status: She is alert.     Comments: Difficult to appreciate baseline orientation     Lab Results Lab Results  Component Value Date   WBC 16.2 (H) 10/31/2019   HGB 10.3 (L) 10/31/2019   HCT 28.9 (L) 10/31/2019   MCV 80.1 10/31/2019   PLT 355 10/31/2019    Lab Results  Component Value Date   CREATININE 0.46 10/31/2019   BUN <5 (L) 10/31/2019   NA 141 10/31/2019   K 3.4 (L) 10/31/2019   CL 109 10/31/2019   CO2 22 10/31/2019    Lab Results  Component Value Date   ALT 28 10/30/2019   AST 20 10/30/2019   ALKPHOS 55 10/30/2019   BILITOT 0.6 10/30/2019     Microbiology: Recent Results (from the past 240 hour(s))  Culture, blood (routine x 2)     Status: None   Collection Time: 10/25/19  7:34 AM   Specimen: BLOOD LEFT HAND  Result Value Ref Range Status   Specimen Description BLOOD LEFT HAND  Final   Special Requests   Final    BOTTLES DRAWN AEROBIC ONLY Blood Culture results may not be optimal due to an inadequate volume of blood received in culture bottles   Culture   Final    NO GROWTH 5 DAYS Performed at [facility], [address] [place], [place] [address]    Report Status 10/30/2019 FINAL  Final  Culture, blood (routine x 2)     Status: None   Collection Time: 10/25/19  7:25 PM   Specimen: Site Not Specified; Blood  Result Value Ref Range Status   Specimen Description SITE NOT SPECIFIED  Final   Special Requests   Final    BOTTLES DRAWN AEROBIC AND ANAEROBIC Blood Culture adequate volume   Culture   Final    NO GROWTH 5 DAYS Performed at [facility], [address]  [place], [place] [address]    Report Status 10/30/2019 FINAL  Final  Culture, blood (routine x 2)     Status: None (Preliminary result)   Collection Time: 10/27/19 11:15 PM   Specimen: BLOOD RIGHT HAND  Result Value Ref Range Status   Specimen Description BLOOD RIGHT HAND  Final   Special Requests   Final    AEROBIC BOTTLE ONLY Blood Culture results may not be optimal due  to an inadequate volume of blood received in culture bottles   Culture   Final    NO GROWTH 3 DAYS Performed at [facility], [place] [address] [place], [place] [address]    Report Status PENDING  Incomplete  Culture, blood (routine x 2)     Status: None (Preliminary result)   Collection Time: 10/27/19 11:20 PM   Specimen: BLOOD LEFT HAND  Result Value Ref Range Status   Specimen Description BLOOD LEFT HAND  Final   Special Requests AEROBIC BOTTLE ONLY Blood Culture adequate volume  Final   Culture   Final    NO GROWTH 3 DAYS Performed at [facility], [place] [address], [place], [place] [address]    Report Status PENDING  Incomplete  Cath Tip Culture     Status: None   Collection Time: 10/28/19  3:39 AM   Specimen: Catheter Tip  Result Value Ref Range Status   Specimen Description CATH TIP  Final   Special Requests NONE  Final   Culture   Final    NO GROWTH 3 DAYS Performed at [facility], [place] [address], [place], [place] [address]    Report Status 10/31/2019 FINAL  Final  Gastrointestinal Panel by PCR , Stool     Status: None   Collection Time: 10/28/19  1:30 PM   Specimen: Stool  Result Value Ref Range Status   Campylobacter species NOT DETECTED NOT DETECTED Final   Plesimonas shigelloides NOT DETECTED NOT DETECTED Final   Salmonella species NOT DETECTED NOT DETECTED Final   Yersinia enterocolitica NOT DETECTED NOT DETECTED Final   Vibrio species NOT DETECTED NOT DETECTED Final   Vibrio cholerae NOT DETECTED NOT DETECTED Final   Enteroaggregative E coli (EAEC) NOT DETECTED NOT DETECTED Final    Enteropathogenic E coli (EPEC) NOT DETECTED NOT DETECTED Final   Enterotoxigenic E coli (ETEC) NOT DETECTED NOT DETECTED Final   Shiga like toxin producing E coli (STEC) NOT DETECTED NOT DETECTED Final   Shigella/Enteroinvasive E coli (EIEC) NOT DETECTED NOT DETECTED Final   Cryptosporidium NOT DETECTED NOT DETECTED Final   Cyclospora cayetanensis NOT DETECTED NOT DETECTED Final   Entamoeba histolytica NOT DETECTED NOT DETECTED Final   Giardia lamblia NOT DETECTED NOT DETECTED Final   Adenovirus F40/41 NOT DETECTED NOT DETECTED Final   Astrovirus NOT DETECTED NOT DETECTED Final   Norovirus GI/GII NOT DETECTED NOT DETECTED Final   Rotavirus A NOT DETECTED NOT DETECTED Final   Sapovirus (I, II, IV, and V) NOT DETECTED NOT DETECTED Final    Comment: Performed at [facility], [address] [place], [place] [address]  MRSA PCR Screening     Status: None   Collection Time: 10/28/19  1:30 PM   Specimen: Nasal Mucosa; Nasopharyngeal  Result Value Ref Range Status   MRSA by PCR NEGATIVE NEGATIVE Final    Comment:        The GeneXpert MRSA Assay (FDA approved for NASAL specimens only), is one component of a comprehensive MRSA colonization surveillance program. It is not intended to diagnose MRSA infection nor to guide or monitor treatment for MRSA infections. Performed at [facility], [place] [address], [place], [place] [address]     Janene Madeira, MSN, NP-C [facility] for Infectious Disease [facility] Cell: [phone] Pager: [phone]  @TODAY @ 10:23 AM

## 2019-11-01 LAB — CULTURE, BLOOD (ROUTINE X 2)
Culture: NO GROWTH
Culture: NO GROWTH
Special Requests: ADEQUATE

## 2019-11-01 LAB — BASIC METABOLIC PANEL
Anion gap: 9 (ref 5–15)
Anion gap: 7 (ref 5–15)
BUN: <5 mg/dL — ABNORMAL LOW (ref 6–20)
BUN: <5 mg/dL — ABNORMAL LOW (ref 6–20)
CO2: 24 mmol/L (ref 22–32)
CO2: 24 mmol/L (ref 22–32)
Calcium: 8.5 mg/dL — ABNORMAL LOW (ref 8.9–10.3)
Calcium: 8.4 mg/dL — ABNORMAL LOW (ref 8.9–10.3)
Chloride: 107 mmol/L (ref 98–111)
Chloride: 107 mmol/L (ref 98–111)
Creatinine, Ser: 0.48 mg/dL (ref 0.44–1.00)
Creatinine, Ser: 0.55 mg/dL (ref 0.44–1.00)
GFR calc Af Amer: >60 mL/min (ref >60)
GFR calc Af Amer: >60 mL/min (ref >60)
GFR calc non Af Amer: >60 mL/min (ref >60)
GFR calc non Af Amer: >60 mL/min (ref >60)
Glucose, Bld: 110 mg/dL — ABNORMAL HIGH (ref 70–99)
Glucose, Bld: 107 mg/dL — ABNORMAL HIGH (ref 70–99)
Potassium: 2.8 mmol/L — ABNORMAL LOW (ref 3.5–5.1)
Potassium: 3.6 mmol/L (ref 3.5–5.1)
Sodium: 140 mmol/L (ref 135–145)
Sodium: 138 mmol/L (ref 135–145)

## 2019-11-01 LAB — CBC
HCT: 28.1 % — ABNORMAL LOW (ref 36.0–46.0)
Hemoglobin: 9.9 g/dL — ABNORMAL LOW (ref 12.0–15.0)
MCH: 27.8 pg (ref 26.0–34.0)
MCHC: 35.2 g/dL (ref 30.0–36.0)
MCV: 78.9 fL — ABNORMAL LOW (ref 80.0–100.0)
Platelets: 379 10*3/uL (ref 150–400)
RBC: 3.56 MIL/uL — ABNORMAL LOW (ref 3.87–5.11)
RDW: 14.3 % (ref 11.5–15.5)
WBC: 16.5 10*3/uL — ABNORMAL HIGH (ref 4.0–10.5)
nRBC: 0 % (ref 0.0–0.2)

## 2019-11-01 LAB — HEPARIN LEVEL (UNFRACTIONATED): Heparin Unfractionated: 0.52 IU/mL (ref 0.30–0.70)

## 2019-11-01 LAB — MAGNESIUM: Magnesium: 1.4 mg/dL — ABNORMAL LOW (ref 1.7–2.4)

## 2019-11-01 LAB — VANCOMYCIN, TROUGH: Vancomycin Tr: 21 ug/mL (ref 15–20)

## 2019-11-01 LAB — APTT: aPTT: 148 seconds — ABNORMAL HIGH (ref 24–36)

## 2019-11-01 MED ORDER — MAGNESIUM SULFATE 2 GM/50ML IV SOLN
2.0000 g | Freq: Once | INTRAVENOUS | Status: AC
  Administered 2019-11-01: 15:00:00 2 g via INTRAVENOUS
  Filled 2019-11-01: qty 50

## 2019-11-01 MED ORDER — POTASSIUM CHLORIDE 10 MEQ/100ML IV SOLN
10.0000 meq | INTRAVENOUS | Status: AC
  Administered 2019-11-01 (×6): 10 meq via INTRAVENOUS
  Filled 2019-11-01 (×6): qty 100

## 2019-11-01 MED ORDER — PANTOPRAZOLE SODIUM 40 MG PO PACK
40.0000 mg | PACK | Freq: Every day | ORAL | Status: DC
  Administered 2019-11-01 – 2019-11-08 (×6): 40 mg via ORAL
  Filled 2019-11-01 (×10): qty 20

## 2019-11-01 MED ORDER — VANCOMYCIN HCL IN DEXTROSE 1-5 GM/200ML-% IV SOLN
1000.0000 mg | Freq: Two times a day (BID) | INTRAVENOUS | Status: DC
  Administered 2019-11-02 – 2019-11-04 (×5): 1000 mg via INTRAVENOUS
  Filled 2019-11-01 (×6): qty 200

## 2019-11-01 MED ORDER — PANTOPRAZOLE SODIUM 40 MG PO PACK
40.0000 mg | PACK | Freq: Every day | ORAL | Status: DC
  Filled 2019-11-01: qty 20

## 2019-11-01 NOTE — Progress Notes (Signed)
Neurosurgery Service Progress Note  Subjective: No acute events overnight  Objective: Vitals:   10/31/19 2122 11/01/19 0029 11/01/19 0514 11/01/19 0749  BP: (!) 147/92 (!) 154/96 (!) 153/96 (!) 136/97  Pulse: (!) 112 99 (!) 107   Resp: (!) 28 20 19  (!) 23  Temp: 100.1 F (37.8 C) 98.8 F (37.1 C) 99.8 F (37.7 C) 99.3 F (37.4 C)  TempSrc: Axillary Oral Axillary Axillary  SpO2: 96% 98% 100% 98%  Weight: 81.3 kg     Height:       Temp (24hrs), Avg:99.7 F (37.6 C), Min:98.8 F (37.1 C), Max:100.2 F (37.9 C)  CBC Latest Ref Rng & Units 11/01/2019 10/31/2019 10/30/2019  WBC 4.0 - 10.5 K/uL 16.5(H) 16.2(H) 14.5(H)  Hemoglobin 12.0 - 15.0 g/dL 9.9(L) 10.3(L) 9.8(L)  Hematocrit 36.0 - 46.0 % 28.1(L) 28.9(L) 27.7(L)  Platelets 150 - 400 K/uL 379 355 347   BMP Latest Ref Rng & Units 11/01/2019 10/31/2019 10/30/2019  Glucose 70 - 99 mg/dL 110(H) 92 107(H)  BUN 6 - 20 mg/dL <5(L) <5(L) <5(L)  Creatinine 0.44 - 1.00 mg/dL 0.48 0.46 0.59  Sodium 135 - 145 mmol/L 140 141 140  Potassium 3.5 - 5.1 mmol/L 2.8(L) 3.4(L) 3.3(L)  Chloride 98 - 111 mmol/L 107 109 110  CO2 22 - 32 mmol/L 24 22 22   Calcium 8.9 - 10.3 mg/dL 8.5(L) 8.3(L) 8.2(L)    Intake/Output Summary (Last 24 hours) at 11/01/2019 0901 Last data filed at 11/01/2019 0707 Gross per 24 hour  Intake 1790.31 ml  Output 2600 ml  Net -809.69 ml    Current Facility-Administered Medications:  .  acetaminophen (TYLENOL) suppository 650 mg, 650 mg, Rectal, Q8H PRN, Blount, Xenia T, NP, 650 mg at 10/28/19 0315 .  acetaminophen (TYLENOL) tablet 650 mg, 650 mg, Oral, Q6H PRN, Neena Rhymes, MD, 650 mg at 11/01/19 0737 .  bisacodyl (DULCOLAX) suppository 10 mg, 10 mg, Rectal, Daily PRN, [name] Pap N, DO, 10 mg at 10/23/19 0418 .  ceFEPIme (MAXIPIME) 2 g in sodium chloride 0.9 % 100 mL IVPB, 2 g, Intravenous, Q8H, Dang, Thuy D, RPH, Last Rate: 200 mL/hr at 11/01/19 0508, 2 g at 11/01/19 0508 .  Chlorhexidine Gluconate Cloth 2 % PADS  6 each, 6 each, Topical, Daily, [name] Pap N, DO, 6 each at 10/31/19 0700 .  dextromethorphan-guaiFENesin (MUCINEX DM) 30-600 MG per 12 hr tablet 2 tablet, 2 tablet, Oral, BID, Kayleen Memos, DO, 2 tablet at 10/31/19 2223 .  dextrose 5 %-0.9 % sodium chloride infusion, , Intravenous, Continuous, Hall, Carole N, DO, Last Rate: 50 mL/hr at 11/01/19 0300 .  dicyclomine (BENTYL) capsule 10 mg, 10 mg, Oral, TID AC & HS, Vena Rua, PA-C, 10 mg at 11/01/19 0651 .  diltiazem (CARDIZEM) 125 mg in dextrose 5% 125 mL (1 mg/mL) infusion, 5-15 mg/hr, Intravenous, Titrated, Hall, Carole N, DO, Last Rate: 10 mL/hr at 10/27/19 1900, 10 mg/hr at 10/27/19 1900 .  diphenhydrAMINE (BENADRYL) capsule 25 mg, 25 mg, Oral, Q6H PRN, Hall, Carole N, DO, 25 mg at 10/27/19 1200 .  ferrous sulfate tablet 325 mg, 325 mg, Oral, Q breakfast, [name] Pap N, DO, 325 mg at 11/01/19 9767 .  heparin ADULT infusion 100 units/mL (25000 units/263mL sodium chloride 0.45%), 1,000 Units/hr, Intravenous, Continuous, Dang, Thuy D, RPH, Last Rate: 10 mL/hr at 11/01/19 0300, 1,000 Units/hr at 11/01/19 0300 .  labetalol (NORMODYNE) injection 5 mg, 5 mg, Intravenous, Q2H PRN, [name] Pap N, DO, 5 mg at 10/29/19 2309 .  levalbuterol (XOPENEX HFA) inhaler 1 puff, 1 puff, Inhalation, Q8H PRN, Hall, Carole N, DO .  lidocaine (XYLOCAINE) 1 % (with pres) injection, , Infiltration, PRN, Bruning, Kevin, PA-C, 5 mL at 10/25/19 1236 .  metoprolol tartrate (LOPRESSOR) tablet 12.5 mg, 12.5 mg, Oral, TID, Hall, Carole N, DO, 12.5 mg at 10/31/19 2222 .  metroNIDAZOLE (FLAGYL) IVPB 500 mg, 500 mg, Intravenous, Q8H, Cliffton Asters, MD, Last Rate: 100 mL/hr at 11/01/19 0300 .  morphine 2 MG/ML injection 1 mg, 1 mg, Intravenous, Q4H PRN, Dow Adolph N, DO, 1 mg at 10/31/19 2232 .  morphine 2 MG/ML injection 1 mg, 1 mg, Intravenous, Once, [name], Belinda K, NP .  ondansetron (ZOFRAN) tablet 4 mg, 4 mg, Oral, Q6H PRN **OR** ondansetron (ZOFRAN) injection 4  mg, 4 mg, Intravenous, Q6H PRN, Charlsie Quest, MD, 4 mg at 10/25/19 1937 .  pantoprazole sodium (PROTONIX) 40 mg/20 mL oral suspension 40 mg, 40 mg, Per Tube, Daily, Amin, Ankit Chirag, MD .  PHENobarbital (LUMINAL) tablet 97.2 mg, 97.2 mg, Oral, QHS, Levie Heritage, DO, 97.2 mg at 10/31/19 2222 .  polyethylene glycol (MIRALAX / GLYCOLAX) packet 17 g, 17 g, Oral, Daily PRN, Hall, Carole N, DO .  potassium chloride 10 mEq in 100 mL IVPB, 10 mEq, Intravenous, Q1 Hr x 6, [name], Belinda K, NP, Last Rate: 100 mL/hr at 11/01/19 0811, 10 mEq at 11/01/19 0811 .  senna-docusate (Senokot-S) tablet 2 tablet, 2 tablet, Oral, QHS PRN, Amin, Ankit Chirag, MD .  sodium chloride flush (NS) 0.9 % injection 10-40 mL, 10-40 mL, Intracatheter, PRN, Amin, Ankit Chirag, MD .  sucralfate (CARAFATE) 1 GM/10ML suspension 1 g, 1 g, Oral, TID WC & HS, Hall, Carole N, DO, 1 g at 11/01/19 0737 .  vancomycin (VANCOCIN) IVPB 750 mg/150 ml premix, 750 mg, Intravenous, Q8H, Dang, Thuy D, RPH, Last Rate: 150 mL/hr at 11/01/19 0530, 750 mg at 11/01/19 0530   Physical Exam: Awake/alert, answers yes/no to some questions, spastic quadriparesis Abdomen appears distended, hypertympanic, diffuse tenderness across her entire body but does seem to be more tender in the abdomen, no guarding on exam, abdomen appears benign  Assessment & Plan: 33 y.o. woman w/ h/o MRCP, shunted hydrocephalus, admitted for abdominal pain and fevers, prolonged course w/ LP w/ pleocytosis w/o positive culture, DVT, then RLL PNA. Initial CT A/P showed normal trace fluid 2/2 CSF drainage, but repeat CT A/P showed possible catheter tip pseudocyst with some reactive changes. Last VPS revision was 20+ years ago. Proximal catheter in interhemispheric space. 11/4 rpt LP w/ no OP, CSF shows 61W 1R TP80 G64, 92% lymphs   -continue ABx with plan to repeat CT A/P p 7 days of ABx, abdominal pain slowly improving, hopefully should avoid externalization   Jadene Pierini  11/01/19 9:01 AM

## 2019-11-01 NOTE — Progress Notes (Signed)
PROGRESS NOTE    Tracey Graham  ONG:[id number] DOB: 1986/10/02 DOA: 10/13/2019 PCP: Clinic, [facility]   Brief Narrative:  33 year old with history of cerebral palsy, wheelchair-bound/fully dependent on ADL S, VP shunt for hydrocephalus, seizure disorder, intellectual disability came to the ER with complaints of fever abdominal pain.  Initial SIRS, COVID-19-negative.  CT of the abdomen pelvis was overall negative.  CT head-negative.  Work-up showed left lower extremity acute DVT started on anticoagulation.  LP initially performed 10/21 which showed pleocytosis, elevated protein and lymphocytic predominance without any bacteria.  Initial thoughts were DVT causing her fevers.  Aseptic meningitis-treated with 3 days of Decadron.  Course further complicated by abdominal pain with GI consultation.  EGD on 10/29 showed esophagitis and gastric ulcers for which GI recommended PPI twice daily.  No biopsies were taken as patient was on Eliquis.  Repeat CT of the abdomen pelvis with contrast showed loculated fluid collection at the tip of the VP shunt raising concerns for VP shunt infection.  Neurosurgery consulted, repeat LP performed on 10/30/2019.   Patient is currently on antibiotics, Vanco, cefepime and metronidazole and will do repeat CT abdominal pelvis next week.  Neurosurgery managing..  Assessment & Plan:   Principal Problem:   Infection of VP (ventriculoperitoneal) shunt (HCC) Active Problems:   Hydrocephalus (HCC)   VP (ventriculoperitoneal) shunt status   Encephalopathy   Abdominal pain   Nuchal rigidity   Aseptic meningitis   Constipation   Deep vein thrombosis (DVT) of femoral vein of left lower extremity (HCC)   AMS (altered mental status)  Sepsis of unclear etiology, could be multiple reasons. Recurrent fevers, unknown etiology Suspicion for multifocal pneumonia versus VP shunt infection nfection possibly infected shunt..  Cultures remain negative, GI panel negative.  CT of  the abdomen pelvis with contrast on 10/30 showed loculated fluid around tip of VP shunt in the left anterior pelvis.  Neurosurgery following. -Initial LP 10/17/2019-unrevealing.                                                         . -Repeat CT of the abdomen pelvis 11/03/2019.  Acute hypoxic respiratory failure secondary to multifocal pneumonia -Chest x-ray showed bilateral pulmonary infiltrates at the bases. -Currently on IV vancomycin, cefepime, Flagyl -Infectious disease team following. -Aggressive bronchodilators.  Incentive spirometer and flutter valve if she uses it.  Abdominal discomfort, nonspecific -Diminished bowel sounds.  Possible could be developing ileus. Abdominal x-ray showed ileus versus possible small bowel obstruction. Will obtain CT abdomen and pelvis on 11/03/2019 to further evaluate   Persistent sinus tachycardia with uncontrolled elevated blood pressure.   Possibly inappropriate sinus tachycardia versus ongoing infection. May consider ivabradine if infection is ruled out and heart rate is now controlled beta-blocker or calcium channel blocker.  Esophagitis and nonbleeding gastric ulcers Continue with Protonix twice daily Continue with Carafate 4 times daily.   Acute left lower extremity DVT -Continue anticoagulation.  Iron deficiency anemia -Iron supplements.  Bowel regimen.  Dysphagia -Aspiration precaution  Seizure disorder Continue with antiepileptic drugs .  DVT prophylaxis: Heparin drip Code Status: Full code Family Communication: Mother at bedside Disposition Plan: Pending clinical improvement   Consultants:   Neurosurgery  Infectious disease   Subjective: Seen and examined at bedside.  Mother at bedside.  Patient is sleeping though arousable.  Not in acute distress.  Has no new complaints.  Her mother is at bedside and provided much of the information.  Review of Systems Otherwise negative except as per HPI, including: Difficult to  obtain from her  Objective: Vitals:   11/01/19 0749 11/01/19 1025 11/01/19 1113 11/01/19 1628  BP: (!) 136/97  126/84 (!) 141/108  Pulse:  95 (!) 107   Resp: (!) 23  (!) 25 19  Temp: 99.3 F (37.4 C)  98.8 F (37.1 C) 99 F (37.2 C)  TempSrc: Axillary  Axillary Axillary  SpO2: 98%  97% 98%  Weight:      Height:        Intake/Output Summary (Last 24 hours) at 11/01/2019 1834 Last data filed at 11/01/2019 1600 Gross per 24 hour  Intake 3147.37 ml  Output 2450 ml  Net 697.37 ml   Filed Weights   10/13/19 2006 10/14/19 0237 10/31/19 2122  Weight: 74.4 kg 74.7 kg 81.3 kg    Examination:  General exam: Slight distress due to abdominal discomfort Respiratory system: Diminished at bases Cardiovascular system: S1 & S2 heard, RRR. No JVD, murmurs, rubs, gallops or clicks. No pedal edema. Gastrointestinal system: Abdomen mildly distended.  Diminished bowel sounds. Central nervous system: Alert to her name only which is at baseline Extremities: Contracted upper extremities Skin: No rashes, lesions or ulcers Psychiatry: Poor judgment and insight  External urinary catheter Right neck triple-lumen catheter 10/27/2019.  Data Reviewed:   CBC: Recent Labs  Lab 10/26/19 0640 10/27/19 0340 10/28/19 0118 10/28/19 1025 10/29/19 0612 10/30/19 0441 10/31/19 0500 11/01/19 0500  WBC 18.9* 11.5* 10.1  --  16.6* 14.5* 16.2* 16.5*  NEUTROABS 15.5* 9.1*  --   --   --   --   --   --   HGB 11.8* 9.4* 9.4*  --  9.9* 9.8* 10.3* 9.9*  HCT 32.6* 25.9* 25.8* 28.8* 27.7* 27.7* 28.9* 28.1*  MCV 80.1 79.4* 79.6*  --  79.8* 80.5 80.1 78.9*  PLT 325 286 269  --  339 347 355 379   Basic Metabolic Panel: Recent Labs  Lab 10/28/19 1025  10/29/19 0612 10/30/19 0441 10/31/19 0500 11/01/19 0500 11/01/19 1500  NA  --   --  138 140 141 140 138  K  --    < > 3.7 3.3* 3.4* 2.8* 3.6  CL  --   --  107 110 109 107 107  CO2  --   --  21* 22 22 24 24   GLUCOSE  --   --  131* 107* 92 110* 107*  BUN   --   --  5* <5* <5* <5* <5*  CREATININE  --   --  0.57 0.59 0.46 0.48 0.55  CALCIUM  --   --  8.2* 8.2* 8.3* 8.5* 8.4*  MG 2.4  --   --   --   --  1.4*  --    < > = values in this interval not displayed.   GFR: Estimated Creatinine Clearance: 103.1 mL/min (by C-G formula based on SCr of 0.55 mg/dL). Liver Function Tests: Recent Labs  Lab 10/27/19 0340 10/28/19 0118 10/30/19 0441  AST 14* 22 20  ALT 16 25 28   ALKPHOS 50 52 55  BILITOT 1.1 1.1 0.6  PROT 6.1* 6.0* 5.8*  ALBUMIN 3.7 3.3* 2.8*   No results for input(s): LIPASE, AMYLASE in the last 168 hours. No results for input(s): AMMONIA in the last 168 hours. Coagulation Profile: No results for input(s): INR,  PROTIME in the last 168 hours. Cardiac Enzymes: No results for input(s): CKTOTAL, CKMB, CKMBINDEX, TROPONINI in the last 168 hours. BNP (last 3 results) No results for input(s): PROBNP in the last 8760 hours. HbA1C: No results for input(s): HGBA1C in the last 72 hours. CBG: No results for input(s): GLUCAP in the last 168 hours. Lipid Profile: No results for input(s): CHOL, HDL, LDLCALC, TRIG, CHOLHDL, LDLDIRECT in the last 72 hours. Thyroid Function Tests: No results for input(s): TSH, T4TOTAL, FREET4, T3FREE, THYROIDAB in the last 72 hours. Anemia Panel: No results for input(s): VITAMINB12, FOLATE, FERRITIN, TIBC, IRON, RETICCTPCT in the last 72 hours. Sepsis Labs: Recent Labs  Lab 10/26/19 0541 10/26/19 1845 10/26/19 2145 10/28/19 0118 10/30/19 0441 10/31/19 0500  PROCALCITON 5.22  --   --   --  0.52 0.25  LATICACIDVEN  --  0.7 0.8 0.5 1.2  --     Recent Results (from the past 240 hour(s))  Culture, blood (routine x 2)     Status: None   Collection Time: 10/25/19  7:34 AM   Specimen: BLOOD LEFT HAND  Result Value Ref Range Status   Specimen Description BLOOD LEFT HAND  Final   Special Requests   Final    BOTTLES DRAWN AEROBIC ONLY Blood Culture results may not be optimal due to an inadequate volume of  blood received in culture bottles   Culture   Final    NO GROWTH 5 DAYS Performed at [facility], [address], [place], [place] [address]    Report Status 10/30/2019 FINAL  Final  Culture, blood (routine x 2)     Status: None   Collection Time: 10/25/19  7:25 PM   Specimen: Site Not Specified; Blood  Result Value Ref Range Status   Specimen Description SITE NOT SPECIFIED  Final   Special Requests   Final    BOTTLES DRAWN AEROBIC AND ANAEROBIC Blood Culture adequate volume   Culture   Final    NO GROWTH 5 DAYS Performed at [facility], [address] [place], [place] [address]    Report Status 10/30/2019 FINAL  Final  Culture, blood (routine x 2)     Status: None   Collection Time: 10/27/19 11:15 PM   Specimen: BLOOD RIGHT HAND  Result Value Ref Range Status   Specimen Description BLOOD RIGHT HAND  Final   Special Requests   Final    AEROBIC BOTTLE ONLY Blood Culture results may not be optimal due to an inadequate volume of blood received in culture bottles   Culture   Final    NO GROWTH 5 DAYS Performed at [facility], [address], [place], [place] [address]    Report Status 11/01/2019 FINAL  Final  Culture, blood (routine x 2)     Status: None   Collection Time: 10/27/19 11:20 PM   Specimen: BLOOD LEFT HAND  Result Value Ref Range Status   Specimen Description BLOOD LEFT HAND  Final   Special Requests AEROBIC BOTTLE ONLY Blood Culture adequate volume  Final   Culture   Final    NO GROWTH 5 DAYS Performed at [facility], [address] [place], [place] [address]    Report Status 11/01/2019 FINAL  Final  Cath Tip Culture     Status: None   Collection Time: 10/28/19  3:39 AM   Specimen: Catheter Tip  Result Value Ref Range Status   Specimen Description CATH TIP  Final  Special Requests NONE  Final   Culture   Final    NO GROWTH 3 DAYS Performed at [facility], [place] [address] [place], [place] [address]    Report Status  10/31/2019 FINAL  Final  Gastrointestinal Panel by PCR , Stool     Status: None   Collection Time: 10/28/19  1:30 PM   Specimen: Stool  Result Value Ref Range Status   Campylobacter species NOT DETECTED NOT DETECTED Final   Plesimonas shigelloides NOT DETECTED NOT DETECTED Final   Salmonella species NOT DETECTED NOT DETECTED Final   Yersinia enterocolitica NOT DETECTED NOT DETECTED Final   Vibrio species NOT DETECTED NOT DETECTED Final   Vibrio cholerae NOT DETECTED NOT DETECTED Final   Enteroaggregative E coli (EAEC) NOT DETECTED NOT DETECTED Final   Enteropathogenic E coli (EPEC) NOT DETECTED NOT DETECTED Final   Enterotoxigenic E coli (ETEC) NOT DETECTED NOT DETECTED Final   Shiga like toxin producing E coli (STEC) NOT DETECTED NOT DETECTED Final   Shigella/Enteroinvasive E coli (EIEC) NOT DETECTED NOT DETECTED Final   Cryptosporidium NOT DETECTED NOT DETECTED Final   Cyclospora cayetanensis NOT DETECTED NOT DETECTED Final   Entamoeba histolytica NOT DETECTED NOT DETECTED Final   Giardia lamblia NOT DETECTED NOT DETECTED Final   Adenovirus F40/41 NOT DETECTED NOT DETECTED Final   Astrovirus NOT DETECTED NOT DETECTED Final   Norovirus GI/GII NOT DETECTED NOT DETECTED Final   Rotavirus A NOT DETECTED NOT DETECTED Final   Sapovirus (I, II, IV, and V) NOT DETECTED NOT DETECTED Final    Comment: Performed at [facility], [address] [place], [place] [address]  MRSA PCR Screening     Status: None   Collection Time: 10/28/19  1:30 PM   Specimen: Nasal Mucosa; Nasopharyngeal  Result Value Ref Range Status   MRSA by PCR NEGATIVE NEGATIVE Final    Comment:        The GeneXpert MRSA Assay (FDA approved for NASAL specimens only), is one component of a comprehensive MRSA colonization surveillance program. It is not intended to diagnose MRSA infection nor to guide or monitor treatment for MRSA infections. Performed at [facility], [place] [address], [place],  [place] [address]          Radiology Studies: Dg Abd 1 View  Result Date: 10/31/2019 CLINICAL DATA:  Abdominal distension. EXAM: ABDOMEN - 1 VIEW COMPARISON:  CT scan 10/26/2019 FINDINGS: Ventriculoperitoneal shunt catheter is noted in the upper pelvis. There are air-filled loops of small bowel along with scattered air and stool throughout the colon and down into the rectum. Findings could be due to an ileus or early small bowel obstruction. No free air. The bony structures are stable. IMPRESSION: Ileus versus early small bowel obstruction. Electronically Signed   By: Marijo Sanes M.D.   On: 10/31/2019 10:48   Dg Swallowing Func-speech Pathology  Result Date: 10/31/2019 Objective Swallowing Evaluation: Type of Study: MBS-Modified Barium Swallow Study  Patient Details Name: Delcia Spitzley MRN: [id number] Date of Birth: March 14, 1986 Today's Date: 10/31/2019 Time: SLP Start Time (ACUTE ONLY): 1302 -SLP Stop Time (ACUTE ONLY): 1321 SLP Time Calculation (min) (ACUTE ONLY): 19 min Past Medical History: Past Medical History: Diagnosis Date . Hydrocephalus ([place])  . Seizures ([place])  . VP (ventriculoperitoneal) shunt status  Past Surgical History: Past Surgical History: Procedure Laterality Date . CARDIAC SURGERY   . ESOPHAGOGASTRODUODENOSCOPY (EGD) WITH PROPOFOL N/A 10/25/2019  Procedure: ESOPHAGOGASTRODUODENOSCOPY (EGD) WITH PROPOFOL;  Surgeon: Irving Copas., MD;  Location: [facility] ENDOS[facility];  Service: Gastroenterology;  Laterality: N/A; . VENTRICULOPERITONEAL SHUNT   HPI: Inocencio HomesVanessa Wachsmuth is a 33 year old woman with hx of CP who presented on 10/16 with encephalopathy, abdominal pain, and fevers. 10/29 EGD showed LA Grade C esophagitis distally. She failed to respond to antibiotics, and was found to have a left lower extremity DVT.  Since then she has had persistent fevers, raising concern for an infection associated with her VP shunt.  CT scan demonstrated a loculated fluid collection near the tip of the shunt in  her pelvis.  Due to her anticoagulation she has been unable to have this fluid sampled.  She has been followed by ID.  CXR showed worsening bilateral pulmonary infiltrates worse at bases. Transferred to ICU 10/31 due to high risk for decompensation and need for higher level of care. BSE completed 10/18 with recommendation for D1/thin meds crushed in puree.  No data recorded Assessment / Plan / Recommendation CHL IP CLINICAL IMPRESSIONS 10/31/2019 Clinical Impression Pt joined by mother who assisted in feeding during MBS in order to ease pt anxiety and support PO intake as pt demonstrated aversion to barrium trials. Pt exhibited oropharyngeal dysphagia with mild risk of aspiration and was observed with thin (straw), puree, and regular solids. Cup presentation of thins not accepted. Thin via straw had decreased bolus cohesion, delayed swallow initiation and epiglottic inversion resulting in 1x instance of penetration above VF before swallow which was cleared with swallow. With puree pt had decreased bolus cohesion, lingual pumping, and delayed oral transit and swallow initiation. Pt initially refused solid presentations but once it was within oral cavity pt accepted and masticated it with reduced rotary chewing and decreased bolus cohesion prior to swallow. Overall, pts oral phase and feeding difficulties are the main barriers to proceeding with regular diet. Pharyngeal phase significant for delayed swallow initiation, although once swallow is triggered it is well coordinated with no residue. Suspect pts penetration with thin due to pts aversion resulting in verbalizations and delayed swallow. Instances of gagging and coughing with absence of bolus going beyond valleculae present. Recommend continue with current diet of Dys 3 (soft) and thin liquids (straw or cup), meds crushed in puree. Education provided to mother on aspiration precautions and she agrees with current POC. SLP will not follow for swallowing treatment.   SLP Visit Diagnosis Dysphagia, oropharyngeal phase (R13.12) Attention and concentration deficit following -- Frontal lobe and executive function deficit following -- Impact on safety and function Mild aspiration risk   CHL IP TREATMENT RECOMMENDATION 10/31/2019 Treatment Recommendations No treatment recommended at this time   Prognosis 10/14/2019 Prognosis for Safe Diet Advancement Good Barriers to Reach Goals Cognitive deficits Barriers/Prognosis Comment -- CHL IP DIET RECOMMENDATION 10/31/2019 SLP Diet Recommendations Dysphagia 3 (Mech soft) solids;Thin liquid Liquid Administration via Cup;Straw Medication Administration Crushed with puree Compensations Minimize environmental distractions;Slow rate;Small sips/bites;Lingual sweep for clearance of pocketing Postural Changes Seated upright at 90 degrees   CHL IP OTHER RECOMMENDATIONS 10/31/2019 Recommended Consults -- Oral Care Recommendations Oral care BID;Staff/trained caregiver to provide oral care Other Recommendations --   CHL IP FOLLOW UP RECOMMENDATIONS 10/31/2019 Follow up Recommendations 24 hour supervision/assistance   CHL IP FREQUENCY AND DURATION 10/14/2019 Speech Therapy Frequency (ACUTE ONLY) min 2x/week Treatment Duration 2 weeks      CHL IP ORAL PHASE 10/31/2019 Oral Phase Impaired Oral - Pudding Teaspoon -- Oral - Pudding Cup -- Oral - Honey Teaspoon -- Oral - Honey Cup -- Oral - Nectar Teaspoon -- Oral - Nectar  Cup -- Oral - Nectar Straw -- Oral - Thin Teaspoon -- Oral - Thin Cup -- Oral - Thin Straw Decreased bolus cohesion Oral - Puree Lingual pumping;Delayed oral transit;Decreased bolus cohesion Oral - Mech Soft -- Oral - Regular Impaired mastication;Decreased bolus cohesion Oral - Multi-Consistency -- Oral - Pill -- Oral Phase - Comment --  CHL IP PHARYNGEAL PHASE 10/31/2019 Pharyngeal Phase Impaired Pharyngeal- Pudding Teaspoon -- Pharyngeal -- Pharyngeal- Pudding Cup -- Pharyngeal -- Pharyngeal- Honey Teaspoon -- Pharyngeal -- Pharyngeal- Honey Cup  -- Pharyngeal -- Pharyngeal- Nectar Teaspoon -- Pharyngeal -- Pharyngeal- Nectar Cup -- Pharyngeal -- Pharyngeal- Nectar Straw -- Pharyngeal -- Pharyngeal- Thin Teaspoon -- Pharyngeal -- Pharyngeal- Thin Cup -- Pharyngeal -- Pharyngeal- Thin Straw Delayed swallow initiation-vallecula Pharyngeal Material enters airway, remains ABOVE vocal cords then ejected out Pharyngeal- Puree Delayed swallow initiation-vallecula Pharyngeal -- Pharyngeal- Mechanical Soft -- Pharyngeal -- Pharyngeal- Regular WFL Pharyngeal -- Pharyngeal- Multi-consistency -- Pharyngeal -- Pharyngeal- Pill -- Pharyngeal -- Pharyngeal Comment --  CHL IP CERVICAL ESOPHAGEAL PHASE 10/31/2019 Cervical Esophageal Phase WFL Pudding Teaspoon -- Pudding Cup -- Honey Teaspoon -- Honey Cup -- Nectar Teaspoon -- Nectar Cup -- Nectar Straw -- Thin Teaspoon -- Thin Cup -- Thin Straw -- Puree -- Mechanical Soft -- Regular -- Multi-consistency -- Pill -- Cervical Esophageal Comment -- Royce Macadamia 10/31/2019, 9:00 PM    Breck Coons Litaker M.Ed Sports administrator Pager [phone] Office [phone]                Scheduled Meds: . Chlorhexidine Gluconate Cloth  6 each Topical Daily  . dextromethorphan-guaiFENesin  2 tablet Oral BID  . dicyclomine  10 mg Oral TID AC & HS  . ferrous sulfate  325 mg Oral Q breakfast  . metoprolol tartrate  12.5 mg Oral TID  .  morphine injection  1 mg Intravenous Once  . pantoprazole sodium  40 mg Oral Daily  . phenobarbital  97.2 mg Oral QHS  . sucralfate  1 g Oral TID WC & HS   Continuous Infusions: . ceFEPime (MAXIPIME) IV Stopped (11/01/19 1334)  . dextrose 5 % and 0.9% NaCl 50 mL/hr at 11/01/19 0300  . diltiazem (CARDIZEM) infusion 10 mg/hr (10/27/19 1900)  . heparin 1,000 Units/hr (11/01/19 1827)  . metronidazole 500 mg (11/01/19 1724)  . [START ON 11/02/2019] vancomycin       LOS: 18 days   Total time spent for this encounter is 35 minutes     Aquilla Hacker, MD [facility] [phone] If 7PM-7AM, please contact night-coverage  11/01/2019, 6:34 PM

## 2019-11-01 NOTE — Progress Notes (Signed)
Pharmacy Antibiotic Note  Tracey Graham is a 33 y.o. female admitted on 10/13/2019 with VP shunt infection.  Pharmacy has been consulted for vancomycin/cefepime dosing. Also on Flagyl per MD. Vancomycin dosing was transitioned to dosing per nomogram due to concern for possible aseptic meningitis. SCr stable at 0.48.  Vancomycin trough resulted slightly high at 21, drawn 30 minutes early. Dose was already given, as trough did not result until 1434.  Plan: Adjust vancomycin to 1g IV q12h Cefepime 2g IV q8h Flagyl 500mg  IV q8h Monitor clinical progress, c/s, renal function F/u de-escalation plan/LOT, vancomycin trough at new Css   Height: 5\' 4"  (162.6 cm) Weight: 179 lb 3.7 oz (81.3 kg) IBW/kg (Calculated) : 54.7  Temp (24hrs), Avg:99.5 F (37.5 C), Min:98.8 F (37.1 C), Max:100.2 F (37.9 C)  Recent Labs  Lab 10/26/19 1845 10/26/19 2145  10/28/19 0118 10/29/19 0612 10/30/19 0441 10/31/19 0500 11/01/19 0500 11/01/19 1100  WBC  --   --    < > 10.1 16.6* 14.5* 16.2* 16.5*  --   CREATININE  --   --    < > 0.49 0.57 0.59 0.46 0.48  --   LATICACIDVEN 0.7 0.8  --  0.5  --  1.2  --   --   --   VANCOTROUGH  --   --   --   --   --   --   --   --  21*   < > = values in this interval not displayed.    Estimated Creatinine Clearance: 103.1 mL/min (by C-G formula based on SCr of 0.48 mg/dL).    No Active Allergies  Antimicrobials this admission: Unasyn 10/30>10/31 Azith 10/30> 11/2 Vanc 10/31 >> Cefepime 10/31 >> Flagyl 10/31 >>  Microbiology results: 10/17 BCx - negative 10/21 CSF - negative (Lymps 88, Neut 8, gluc wnl, protein high) 10/21 HSV pcr - negative 10/23 BCx - negative 10/29 BCx - negative 10/31 BCx - NGTD 11/1 GI panel PCR - negative 11/1 cath tip - negative   Elicia Lamp, PharmD, BCPS Please check AMION for all [place] contact numbers Clinical Pharmacist 11/01/2019 2:38 PM

## 2019-11-01 NOTE — Progress Notes (Signed)
CRITICAL VALUE STICKER  CRITICAL VALUE: Potassium 2.8  RECEIVER (on-site recipient of call): Tawanna Sat, RN  DATE & TIME NOTIFIED: 11/01/2019 6:31 AM  MESSENGER (representative from lab): N/A  MD NOTIFIED: Lennox Grumbles   TIME OF NOTIFICATION: 6:33 AM  RESPONSE: Paged MD. Awaiting orders, will pass on to day shift

## 2019-11-01 NOTE — Progress Notes (Signed)
Pt's Pm po meds refused per pt's mother. Stated, pt was gaging and was having hard time swallowing crushed pills earlier. Mother mentioned pt's abdomen is distended and seems like its getting bigger. Bowel sounds diminished, pt seems uncomfortable. Pt's mother said MD rounding this am mentioned rectal tube to help relieve some gas, Pt's mother requested tube to see if it helps any. Pt did have small BM tonight, but according to mother it didn't help patient with her discomfort. Pt has not verbalized anything to me, no conversation whatsoever. Moaning when touched.  Paged NP X [place] @[phone]  with request. Will continue to monitor.

## 2019-11-01 NOTE — Progress Notes (Signed)
Received patient to room 4e24 from 4N around 2122. Patient altert and oriented to self, calling for mom. Tele monitor applied and CCMD notified. CHG bath given. Mom called and call bell within reach.   Patient mom at beside, arrived around 2200.  Tawanna Sat, RN

## 2019-11-01 NOTE — Progress Notes (Signed)
ANTICOAGULATION CONSULT NOTE  Pharmacy Consult:  Heparin Indication: DVT   No Active Allergies  Patient Measurements: Height: 5\' 4"  (162.6 cm) Weight: 179 lb 3.7 oz (81.3 kg) IBW/kg (Calculated) : 54.7 Heparin Dosing Weight: 70kg  Vital Signs: Temp: 99.3 F (37.4 C) (11/05 0749) Temp Source: Axillary (11/05 0749) BP: 136/97 (11/05 0749) Pulse Rate: 107 (11/05 0514)  Labs: Recent Labs    10/30/19 0441 10/30/19 0626 10/31/19 0500 10/31/19 0655 11/01/19 0500  HGB 9.8*  --  10.3*  --  9.9*  HCT 27.7*  --  28.9*  --  28.1*  PLT 347  --  355  --  379  APTT  --  97*  --  97* 148*  HEPARINUNFRC  --  0.97*  --  0.69 0.52  CREATININE 0.59  --  0.46  --  0.48    Estimated Creatinine Clearance: 103.1 mL/min (by C-G formula based on SCr of 0.48 mg/dL).   Assessment: 20 YOF with acute DVT, started on lovenox and transitioned to Eliquis. Now with possible VP shunt infection and transitioned to heparin for procedures. Last Eliquis administered 10/31 at 2250.  Heparin level remains therapeutic at 0.52. APTT is high but appears to be lab error - does not correlate with therapeutic heparin level. CBC stable. No active bleed issues documented.  Goal of Therapy:  Heparin level 0.3-0.7 units/ml Monitor platelets by anticoagulation protocol: Yes    Plan:  Continue heparin IV at 1000 units/hr Monitor daily heparin level and CBC, s/sx bleeding D/c aPTT monitoring F/U with switching back to DOAC as appropriate   Elicia Lamp, PharmD, BCPS Please check AMION for all [place] contact numbers Clinical Pharmacist 11/01/2019 9:29 AM

## 2019-11-02 DIAGNOSIS — G919 Hydrocephalus, unspecified: Secondary | ICD-10-CM

## 2019-11-02 LAB — CBC
HCT: 27.4 % — ABNORMAL LOW (ref 36.0–46.0)
Hemoglobin: 9.9 g/dL — ABNORMAL LOW (ref 12.0–15.0)
MCH: 28.7 pg (ref 26.0–34.0)
MCHC: 36.1 g/dL — ABNORMAL HIGH (ref 30.0–36.0)
MCV: 79.4 fL — ABNORMAL LOW (ref 80.0–100.0)
Platelets: 345 10*3/uL (ref 150–400)
RBC: 3.45 MIL/uL — ABNORMAL LOW (ref 3.87–5.11)
RDW: 14.2 % (ref 11.5–15.5)
WBC: 14.6 10*3/uL — ABNORMAL HIGH (ref 4.0–10.5)
nRBC: 0 % (ref 0.0–0.2)

## 2019-11-02 LAB — BASIC METABOLIC PANEL
Anion gap: 7 (ref 5–15)
BUN: <5 mg/dL — ABNORMAL LOW (ref 6–20)
CO2: 24 mmol/L (ref 22–32)
Calcium: 8.4 mg/dL — ABNORMAL LOW (ref 8.9–10.3)
Chloride: 108 mmol/L (ref 98–111)
Creatinine, Ser: 0.55 mg/dL (ref 0.44–1.00)
GFR calc Af Amer: >60 mL/min (ref >60)
GFR calc non Af Amer: >60 mL/min (ref >60)
Glucose, Bld: 118 mg/dL — ABNORMAL HIGH (ref 70–99)
Potassium: 3.3 mmol/L — ABNORMAL LOW (ref 3.5–5.1)
Sodium: 139 mmol/L (ref 135–145)

## 2019-11-02 LAB — HEPARIN LEVEL (UNFRACTIONATED): Heparin Unfractionated: 0.62 IU/mL (ref 0.30–0.70)

## 2019-11-02 LAB — MAGNESIUM: Magnesium: 1.7 mg/dL (ref 1.7–2.4)

## 2019-11-02 NOTE — Progress Notes (Addendum)
Rectal tube inserted per Order. Pt was able to pass some gas. tolerated well. Will continue to monitor.

## 2019-11-02 NOTE — Progress Notes (Signed)
ANTICOAGULATION CONSULT NOTE  Pharmacy Consult: Heparin Indication: DVT   No Active Allergies  Patient Measurements: Height: 5\' 4"  (162.6 cm) Weight: 179 lb 3.7 oz (81.3 kg) IBW/kg (Calculated) : 54.7 Heparin Dosing Weight: 70kg  Vital Signs: Temp: 99 F (37.2 C) (11/06 0855) Temp Source: Axillary (11/06 0855) BP: 119/73 (11/06 0855) Pulse Rate: 124 (11/06 0855)  Labs: Recent Labs    10/31/19 0500 10/31/19 0655 11/01/19 0500 11/01/19 1500 11/02/19 0630 11/02/19 0848  HGB 10.3*  --  9.9*  --  9.9*  --   HCT 28.9*  --  28.1*  --  27.4*  --   PLT 355  --  379  --  345  --   APTT  --  97* 148*  --   --   --   HEPARINUNFRC  --  0.69 0.52  --   --  0.62  CREATININE 0.46  --  0.48 0.55 0.55  --     Estimated Creatinine Clearance: 103.1 mL/min (by C-G formula based on SCr of 0.55 mg/dL).   Assessment: 84 YOF with acute DVT, started on lovenox and transitioned to Eliquis. Now with possible VP shunt infection and transitioned to heparin for procedures. Last Eliquis administered 10/31 at 2250.  Heparin level remains therapeutic at 0.62. CBC stable. No active bleed issues documented.  Goal of Therapy:  Heparin level 0.3-0.7 units/ml Monitor platelets by anticoagulation protocol: Yes    Plan:  Continue heparin IV at 1000 units/hr Monitor daily heparin level and CBC, s/sx bleeding F/U with switching back to DOAC as appropriate   Elicia Lamp, PharmD, BCPS Please check AMION for all [facility] contact numbers Clinical Pharmacist 11/02/2019 9:30 AM

## 2019-11-02 NOTE — Plan of Care (Signed)
Poc progressing.  

## 2019-11-02 NOTE — Progress Notes (Signed)
[facility]  Date of Admission:  10/13/2019      Total days of antibiotics  8  Day 7 Vancomycin  Day 7 metronidazole   Day 7 cefepime    ASSESSMENT: Tracey Graham is a 33 y.o. female with h/o CP, hydrocephalus and VP shunt admitted from home with sudden onset fevers and abdominal pain.   Distal VP shunt cyst vs abscess - improving on broad spectrum therapy with vancomycin + cefepime + metronidazole. Nsgy planning repeat scan this weekend.   Fevers - < 100 F now x >24h. WBC slowly declining 14.6K  H/O PCN allergy - has tolerated all antibiotics during admission including unasyn.  Abdominal Pain - KUB indicating ileus; had decompressive enema last night and mom feels this was successful. Improving slowly.    PLAN: 1. Continue cefepime + vancomycin + metronidazole  2. Will follow up CT scan results  Dr. Daiva EvesVan Dam is available for ID questions this weekend.     Principal Problem:   Infection of VP (ventriculoperitoneal) shunt (HCC) Active Problems:   Hydrocephalus (HCC)   VP (ventriculoperitoneal) shunt status   Encephalopathy   Abdominal pain   Nuchal rigidity   Aseptic meningitis   Constipation   Deep vein thrombosis (DVT) of femoral vein of left lower extremity (HCC)   AMS (altered mental status)   . Chlorhexidine Gluconate Cloth  6 each Topical Daily  . dextromethorphan-guaiFENesin  2 tablet Oral BID  . dicyclomine  10 mg Oral TID AC & HS  . ferrous sulfate  325 mg Oral Q breakfast  . metoprolol tartrate  12.5 mg Oral TID  .  morphine injection  1 mg Intravenous Once  . pantoprazole sodium  40 mg Oral Daily  . phenobarbital  97.2 mg Oral QHS  . sucralfate  1 g Oral TID WC & HS    SUBJECTIVE: Patient is resting comfortably in bed. Mom reports more moments where she is her "normal self" today. Still with some discomfort occasionally.  Fevers < 100 F and WBC slowly improved.    Review of Systems: Review of Systems   Unable to perform ROS: Medical condition    No Active Allergies  OBJECTIVE: Vitals:   11/01/19 1957 11/01/19 2339 11/02/19 0427 11/02/19 0855  BP: 130/84 (!) 141/92 112/87 119/73  Pulse: (!) 102 98 (!) 101 (!) 124  Resp: (!) 21 (!) 21 18 17   Temp: 99.7 F (37.6 C) 99.1 F (37.3 C) 98.9 F (37.2 C) 99 F (37.2 C)  TempSrc: Oral Axillary Axillary Axillary  SpO2: 96% 94% 95%   Weight:      Height:       Body mass index is 30.77 kg/m.  Physical Exam Vitals signs and nursing note reviewed.  Constitutional:      Appearance: She is well-developed.     Comments: Resting comfortably in bed.   Cardiovascular:     Rate and Rhythm: Normal rate.     Heart sounds: No murmur.  Pulmonary:     Effort: Pulmonary effort is normal.     Breath sounds: Normal breath sounds.  Abdominal:     General: There is distension.  Skin:    General: Skin is warm and dry.     Findings: No rash.  Neurological:     Mental Status: She is alert.     Comments: Getting back to baseline per mom      Lab Results Lab Results  Component Value  Date   WBC 14.6 (H) 11/02/2019   HGB 9.9 (L) 11/02/2019   HCT 27.4 (L) 11/02/2019   MCV 79.4 (L) 11/02/2019   PLT 345 11/02/2019    Lab Results  Component Value Date   CREATININE 0.55 11/02/2019   BUN <5 (L) 11/02/2019   NA 139 11/02/2019   K 3.3 (L) 11/02/2019   CL 108 11/02/2019   CO2 24 11/02/2019    Lab Results  Component Value Date   ALT 28 10/30/2019   AST 20 10/30/2019   ALKPHOS 55 10/30/2019   BILITOT 0.6 10/30/2019     Microbiology: Recent Results (from the past 240 hour(s))  Culture, blood (routine x 2)     Status: None   Collection Time: 10/25/19  7:34 AM   Specimen: BLOOD LEFT HAND  Result Value Ref Range Status   Specimen Description BLOOD LEFT HAND  Final   Special Requests   Final    BOTTLES DRAWN AEROBIC ONLY Blood Culture results may not be optimal due to an inadequate volume of blood received in culture bottles   Culture    Final    NO GROWTH 5 DAYS Performed at [facility], [address] [place], [place] [address]    Report Status 10/30/2019 FINAL  Final  Culture, blood (routine x 2)     Status: None   Collection Time: 10/25/19  7:25 PM   Specimen: Site Not Specified; Blood  Result Value Ref Range Status   Specimen Description SITE NOT SPECIFIED  Final   Special Requests   Final    BOTTLES DRAWN AEROBIC AND ANAEROBIC Blood Culture adequate volume   Culture   Final    NO GROWTH 5 DAYS Performed at [facility], [address] [place], [place] [address]    Report Status 10/30/2019 FINAL  Final  Culture, blood (routine x 2)     Status: None   Collection Time: 10/27/19 11:15 PM   Specimen: BLOOD RIGHT HAND  Result Value Ref Range Status   Specimen Description BLOOD RIGHT HAND  Final   Special Requests   Final    AEROBIC BOTTLE ONLY Blood Culture results may not be optimal due to an inadequate volume of blood received in culture bottles   Culture   Final    NO GROWTH 5 DAYS Performed at [facility], [address], [place], [place] [address]    Report Status 11/01/2019 FINAL  Final  Culture, blood (routine x 2)     Status: None   Collection Time: 10/27/19 11:20 PM   Specimen: BLOOD LEFT HAND  Result Value Ref Range Status   Specimen Description BLOOD LEFT HAND  Final   Special Requests AEROBIC BOTTLE ONLY Blood Culture adequate volume  Final   Culture   Final    NO GROWTH 5 DAYS Performed at [facility], [address], [place], [place] [address]    Report Status 11/01/2019 FINAL  Final  Cath Tip Culture     Status: None   Collection Time: 10/28/19  3:39 AM   Specimen: Catheter Tip  Result Value Ref Range Status   Specimen Description CATH TIP  Final   Special Requests NONE  Final   Culture   Final    NO GROWTH 3 DAYS Performed at [facility], [address], [place], [place] [address]    Report Status 10/31/2019 FINAL  Final  Gastrointestinal Panel by  PCR , Stool  Status: None   Collection Time: 10/28/19  1:30 PM   Specimen: Stool  Result Value Ref Range Status   Campylobacter species NOT DETECTED NOT DETECTED Final   Plesimonas shigelloides NOT DETECTED NOT DETECTED Final   Salmonella species NOT DETECTED NOT DETECTED Final   Yersinia enterocolitica NOT DETECTED NOT DETECTED Final   Vibrio species NOT DETECTED NOT DETECTED Final   Vibrio cholerae NOT DETECTED NOT DETECTED Final   Enteroaggregative E coli (EAEC) NOT DETECTED NOT DETECTED Final   Enteropathogenic E coli (EPEC) NOT DETECTED NOT DETECTED Final   Enterotoxigenic E coli (ETEC) NOT DETECTED NOT DETECTED Final   Shiga like toxin producing E coli (STEC) NOT DETECTED NOT DETECTED Final   Shigella/Enteroinvasive E coli (EIEC) NOT DETECTED NOT DETECTED Final   Cryptosporidium NOT DETECTED NOT DETECTED Final   Cyclospora cayetanensis NOT DETECTED NOT DETECTED Final   Entamoeba histolytica NOT DETECTED NOT DETECTED Final   Giardia lamblia NOT DETECTED NOT DETECTED Final   Adenovirus F40/41 NOT DETECTED NOT DETECTED Final   Astrovirus NOT DETECTED NOT DETECTED Final   Norovirus GI/GII NOT DETECTED NOT DETECTED Final   Rotavirus A NOT DETECTED NOT DETECTED Final   Sapovirus (I, II, IV, and V) NOT DETECTED NOT DETECTED Final    Comment: Performed at [facility], [address] [place], [place] [address]  MRSA PCR Screening     Status: None   Collection Time: 10/28/19  1:30 PM   Specimen: Nasal Mucosa; Nasopharyngeal  Result Value Ref Range Status   MRSA by PCR NEGATIVE NEGATIVE Final    Comment:        The GeneXpert MRSA Assay (FDA approved for NASAL specimens only), is one component of a comprehensive MRSA colonization surveillance program. It is not intended to diagnose MRSA infection nor to guide or monitor treatment for MRSA infections. Performed at [facility], [place] [address] [place], [place] [address]     Janene Madeira, MSN, NP-C  [facility] Cell: [phone] Pager: [phone]  @TODAY @ 10:47 AM

## 2019-11-02 NOTE — Progress Notes (Signed)
Neurosurgery Service Progress Note  Subjective: No acute events overnight, some mild improvement in abdominal pain  Objective: Vitals:   11/01/19 1957 11/01/19 2339 11/02/19 0427 11/02/19 0855  BP: 130/84 (!) 141/92 112/87 119/73  Pulse: (!) 102 98 (!) 101 (!) 124  Resp: (!) 21 (!) 21 18 17   Temp: 99.7 F (37.6 C) 99.1 F (37.3 C) 98.9 F (37.2 C) 99 F (37.2 C)  TempSrc: Oral Axillary Axillary Axillary  SpO2: 96% 94% 95%   Weight:      Height:       Temp (24hrs), Avg:99.1 F (37.3 C), Min:98.8 F (37.1 C), Max:99.7 F (37.6 C)  CBC Latest Ref Rng & Units 11/02/2019 11/01/2019 10/31/2019  WBC 4.0 - 10.5 K/uL 14.6(H) 16.5(H) 16.2(H)  Hemoglobin 12.0 - 15.0 g/dL 9.9(L) 9.9(L) 10.3(L)  Hematocrit 36.0 - 46.0 % 27.4(L) 28.1(L) 28.9(L)  Platelets 150 - 400 K/uL 345 379 355   BMP Latest Ref Rng & Units 11/02/2019 11/01/2019 11/01/2019  Glucose 70 - 99 mg/dL 118(H) 107(H) 110(H)  BUN 6 - 20 mg/dL <5(L) <5(L) <5(L)  Creatinine 0.44 - 1.00 mg/dL 0.55 0.55 0.48  Sodium 135 - 145 mmol/L 139 138 140  Potassium 3.5 - 5.1 mmol/L 3.3(L) 3.6 2.8(L)  Chloride 98 - 111 mmol/L 108 107 107  CO2 22 - 32 mmol/L 24 24 24   Calcium 8.9 - 10.3 mg/dL 8.4(L) 8.4(L) 8.5(L)    Intake/Output Summary (Last 24 hours) at 11/02/2019 0918 Last data filed at 11/02/2019 0430 Gross per 24 hour  Intake 3600.27 ml  Output 2450 ml  Net 1150.27 ml    Current Facility-Administered Medications:  .  acetaminophen (TYLENOL) suppository 650 mg, 650 mg, Rectal, Q8H PRN, Blount, Xenia T, NP, 650 mg at 10/28/19 0315 .  acetaminophen (TYLENOL) tablet 650 mg, 650 mg, Oral, Q6H PRN, Norins, Heinz Knuckles, MD, 650 mg at 11/01/19 1904 .  bisacodyl (DULCOLAX) suppository 10 mg, 10 mg, Rectal, Daily PRN, [name] Pap N, DO, 10 mg at 10/23/19 0418 .  ceFEPIme (MAXIPIME) 2 g in sodium chloride 0.9 % 100 mL IVPB, 2 g, Intravenous, Q8H, Dang, Thuy D, RPH, Last Rate: 200 mL/hr at 11/02/19 0503, 2 g at 11/02/19 0503 .  Chlorhexidine  Gluconate Cloth 2 % PADS 6 each, 6 each, Topical, Daily, [name] Pap N, DO, 6 each at 10/31/19 0700 .  dextromethorphan-guaiFENesin (MUCINEX DM) 30-600 MG per 12 hr tablet 2 tablet, 2 tablet, Oral, BID, [name] Pap N, DO, 2 tablet at 11/01/19 1026 .  dextrose 5 %-0.9 % sodium chloride infusion, , Intravenous, Continuous, Kayleen Memos, DO, Last Rate: 50 mL/hr at 11/01/19 2108 .  dicyclomine (BENTYL) capsule 10 mg, 10 mg, Oral, TID AC & HS, Vena Rua, PA-C, 10 mg at 11/02/19 0659 .  diltiazem (CARDIZEM) 125 mg in dextrose 5% 125 mL (1 mg/mL) infusion, 5-15 mg/hr, Intravenous, Titrated, Hall, Carole N, DO, Last Rate: 10 mL/hr at 10/27/19 1900, 10 mg/hr at 10/27/19 1900 .  diphenhydrAMINE (BENADRYL) capsule 25 mg, 25 mg, Oral, Q6H PRN, Hall, Carole N, DO, 25 mg at 10/27/19 1200 .  ferrous sulfate tablet 325 mg, 325 mg, Oral, Q breakfast, [name] Pap N, DO, 325 mg at 11/01/19 7622 .  heparin ADULT infusion 100 units/mL (25000 units/258mL sodium chloride 0.45%), 1,000 Units/hr, Intravenous, Continuous, Dang, Thuy D, RPH, Last Rate: 10 mL/hr at 11/01/19 1827, 1,000 Units/hr at 11/01/19 1827 .  labetalol (NORMODYNE) injection 5 mg, 5 mg, Intravenous, Q2H PRN, Hall, Carole N, DO, 5 mg at  10/29/19 2309 .  levalbuterol (XOPENEX HFA) inhaler 1 puff, 1 puff, Inhalation, Q8H PRN, Hall, Carole N, DO .  lidocaine (XYLOCAINE) 1 % (with pres) injection, , Infiltration, PRN, Bruning, Kevin, PA-C, 5 mL at 10/25/19 1236 .  metoprolol tartrate (LOPRESSOR) tablet 12.5 mg, 12.5 mg, Oral, TID, Hall, Carole N, DO, 12.5 mg at 11/01/19 1623 .  metroNIDAZOLE (FLAGYL) IVPB 500 mg, 500 mg, Intravenous, Q8H, Cliffton Asters, MD, Last Rate: 100 mL/hr at 11/02/19 0156, 500 mg at 11/02/19 0156 .  morphine 2 MG/ML injection 1 mg, 1 mg, Intravenous, Q4H PRN, Dow Adolph N, DO, 1 mg at 11/02/19 0658 .  morphine 2 MG/ML injection 1 mg, 1 mg, Intravenous, Once, [name], Belinda K, NP .  ondansetron (ZOFRAN) tablet 4 mg, 4 mg,  Oral, Q6H PRN **OR** ondansetron (ZOFRAN) injection 4 mg, 4 mg, Intravenous, Q6H PRN, Charlsie Quest, MD, 4 mg at 11/02/19 0658 .  pantoprazole sodium (PROTONIX) 40 mg/20 mL oral suspension 40 mg, 40 mg, Oral, Daily, Ajuonuma, Verdie Mosher, MD, 40 mg at 11/01/19 1157 .  PHENobarbital (LUMINAL) tablet 97.2 mg, 97.2 mg, Oral, QHS, Levie Heritage, DO, 97.2 mg at 10/31/19 2222 .  polyethylene glycol (MIRALAX / GLYCOLAX) packet 17 g, 17 g, Oral, Daily PRN, Hall, Carole N, DO .  senna-docusate (Senokot-S) tablet 2 tablet, 2 tablet, Oral, QHS PRN, Amin, Ankit Chirag, MD .  sodium chloride flush (NS) 0.9 % injection 10-40 mL, 10-40 mL, Intracatheter, PRN, Amin, Ankit Chirag, MD .  sucralfate (CARAFATE) 1 GM/10ML suspension 1 g, 1 g, Oral, TID WC & HS, Hall, Carole N, DO, 1 g at 11/01/19 1623 .  vancomycin (VANCOCIN) IVPB 1000 mg/200 mL premix, 1,000 mg, Intravenous, Q12H, Almon Hercules, [facility], Last Rate: 200 mL/hr at 11/02/19 0049, 1,000 mg at 11/02/19 0049   Physical Exam: Awake/alert, answers yes/no to some questions, spastic quadriparesis Abdomen appears distended, hypertympanic, no guarding on exam, abdomen appears benign  Assessment & Plan: 33 y.o. woman w/ h/o MRCP, shunted hydrocephalus, admitted for abdominal pain and fevers, prolonged course w/ LP w/ pleocytosis w/o positive culture, DVT, then RLL PNA. Initial CT A/P showed normal trace fluid 2/2 CSF drainage, but repeat CT A/P showed possible catheter tip pseudocyst with some reactive changes. Last VPS revision was 20+ years ago. Proximal catheter in interhemispheric space. 11/4 rpt LP w/ no OP, CSF shows 61W 1R TP80 G64, 92% lymphs   -continue ABx with plan to repeat CT A/P p 7 days of ABx, abdominal pain slowly improving, hopefully should avoid externalization   Jadene Pierini  11/02/19 9:18 AM

## 2019-11-02 NOTE — Progress Notes (Signed)
PROGRESS NOTE    Tracey Graham  ZOX:[id number] DOB: [birth year] DOA: 10/13/2019 PCP: Clinic, [facility]   Brief Narrative:  33 year old with history of cerebral palsy, wheelchair-bound/fully dependent on ADL S, VP shunt for hydrocephalus, seizure disorder, intellectual disability came to the ER with complaints of fever abdominal pain.  Initial SIRS, COVID-19-negative.  CT of the abdomen pelvis was overall negative.  CT head-negative.  Work-up showed left lower extremity acute DVT started on anticoagulation.  LP initially performed 10/21 which showed pleocytosis, elevated protein and lymphocytic predominance without any bacteria.  Initial thoughts were DVT causing her fevers.  Aseptic meningitis-treated with 3 days of Decadron.  Course further complicated by abdominal pain with GI consultation.  EGD on 10/29 showed esophagitis and gastric ulcers for which GI recommended PPI twice daily.  No biopsies were taken as patient was on Eliquis.  Repeat CT of the abdomen pelvis with contrast showed loculated fluid collection at the tip of the VP shunt raising concerns for VP shunt infection.  Neurosurgery consulted, repeat LP performed on 10/30/2019.   Patient is currently on antibiotics, Vanco, cefepime and metronidazole and will do repeat CT abdominal pelvis tomorrow.  Neurosurgery managing.  Assessment & Plan:   Principal Problem:   Infection of VP (ventriculoperitoneal) shunt (HCC) Active Problems:   Hydrocephalus (HCC)   VP (ventriculoperitoneal) shunt status   Encephalopathy   Abdominal pain   Nuchal rigidity   Aseptic meningitis   Constipation   Deep vein thrombosis (DVT) of femoral vein of left lower extremity (HCC)   AMS (altered mental status)  Sepsis of unclear etiology, could be multiple reasons. Recurrent fevers, unknown etiology Suspicion for multifocal pneumonia versus VP shunt infection nfection possibly infected shunt..  Cultures remain negative, GI panel negative.  CT of  the abdomen pelvis with contrast on 10/30 showed loculated fluid around tip of VP shunt in the left anterior pelvis.  Neurosurgery following. -Initial LP 10/17/2019-unrevealing.                                                         . -Repeat CT of the abdomen pelvis 11/03/2019. Neurosurgery on board  Acute hypoxic respiratory failure secondary to multifocal pneumonia -Chest x-ray showed bilateral pulmonary infiltrates at the bases. -Currently on IV vancomycin, cefepime, Flagyl.  Breathing has remained stable and not on O2. -Infectious disease team following. -Aggressive bronchodilators.  Incentive spirometer and flutter valve if she uses it.  Abdominal discomfort, nonspecific -Diminished bowel sounds.  Possible could be developing ileus. Abdominal x-ray showed ileus versus possible small bowel obstruction. Will obtain CT abdomen and pelvis on 11/03/2019 to further evaluate   Persistent sinus tachycardia with uncontrolled elevated blood pressure.   Possibly inappropriate sinus tachycardia versus ongoing infection. May consider ivabradine if infection is ruled out and heart rate is not controlled beta-blocker or calcium channel blocker.  Esophagitis and nonbleeding gastric ulcers Continue with Protonix twice daily Continue with Carafate 4 times daily.   Acute left lower extremity DVT -Continue anticoagulation.  Iron deficiency anemia -Iron supplements.  Bowel regimen.  Dysphagia -Aspiration precaution  Seizure disorder Continue with antiepileptic drugs .  DVT prophylaxis: Heparin drip Code Status: Full code Family Communication: Mother at bedside Disposition Plan: Pending clinical improvement   Consultants:   Neurosurgery  Infectious disease   Subjective: Seen and examined at bedside.  Mother at bedside.  Patient is awake but not verbalizing .  Not in acute distress.  Has no new complaints.  Mother stated that patient had a rectal tube placed overnight.  No acute  overnight event was reported. Review of Systems Otherwise negative except as per HPI, including: Difficult to obtain from her  Objective: Vitals:   11/01/19 2339 11/02/19 0427 11/02/19 0855 11/02/19 1208  BP: (!) 141/92 112/87 119/73 127/78  Pulse: 98 (!) 101 (!) 124 (!) 102  Resp: (!) Temp: 99.1 F (37.3 C) 98.9 F (37.2 C) 99 F (37.2 C) 97.8 F (36.6 C)  TempSrc: Axillary Axillary Axillary Axillary  SpO2: 94% 95%  94%  Weight:      Height:        Intake/Output Summary (Last 24 hours) at 11/02/2019 1345 Last data filed at 11/02/2019 1338 Gross per 24 hour  Intake 2285.48 ml  Output 2800 ml  Net -514.52 ml   Filed Weights   10/13/19 2006 10/14/19 0237 10/31/19 2122  Weight: 74.4 kg 74.7 kg 81.3 kg    Examination:  General exam: Slight distress due to abdominal discomfort Respiratory system: Diminished at bases Cardiovascular system: S1 & S2 heard, RRR. No JVD, murmurs, rubs, gallops or clicks. No pedal edema. Gastrointestinal system: Abdomen mildly distended.  Diminished bowel sounds. Central nervous system: Alert to her name only which is at baseline Extremities: Contracted upper extremities Skin: No rashes, lesions or ulcers Psychiatry: Poor judgment and insight  External urinary catheter Right neck triple-lumen catheter 10/27/2019.  Data Reviewed:   CBC: Recent Labs  Lab 10/27/19 0340  10/29/19 0612 10/30/19 0441 10/31/19 0500 11/01/19 0500 11/02/19 0630  WBC 11.5*   < > 16.6* 14.5* 16.2* 16.5* 14.6*  NEUTROABS 9.1*  --   --   --   --   --   --   HGB 9.4*   < > 9.9* 9.8* 10.3* 9.9* 9.9*  HCT 25.9*   < > 27.7* 27.7* 28.9* 28.1* 27.4*  MCV 79.4*   < > 79.8* 80.5 80.1 78.9* 79.4*  PLT 286   < > 339 347 355 379 345   < > = values in this interval not displayed.   Basic Metabolic Panel: Recent Labs  Lab 10/28/19 1025  10/30/19 0441 10/31/19 0500 11/01/19 0500 11/01/19 1500 11/02/19 0630  NA  --    < > 140 141 140 138 139  K  --     < > 3.3* 3.4* 2.8* 3.6 3.3*  CL  --    < > 110 109 107 107 108  CO2  --    < > GLUCOSE  --    < > 107* 92 110* 107* 118*  BUN  --    < > <5* <5* <5* <5* <5*  CREATININE  --    < > 0.59 0.46 0.48 0.55 0.55  CALCIUM  --    < > 8.2* 8.3* 8.5* 8.4* 8.4*  MG 2.4  --   --   --  1.4*  --  1.7   < > = values in this interval not displayed.   GFR: Estimated Creatinine Clearance: 103.1 mL/min (by C-G formula based on SCr of 0.55 mg/dL). Liver Function Tests: Recent Labs  Lab 10/27/19 0340 10/28/19 0118 10/30/19 0441  AST 14* 22 20  ALT ALKPHOS 50 52 55  BILITOT 1.1 1.1 0.6  PROT 6.1* 6.0* 5.8*  ALBUMIN 3.7 3.3* 2.8*   No results for input(s): LIPASE, AMYLASE in the last 168 hours. No results for input(s): AMMONIA in the last 168 hours. Coagulation Profile: No results for input(s): INR, PROTIME in the last 168 hours. Cardiac Enzymes: No results for input(s): CKTOTAL, CKMB, CKMBINDEX, TROPONINI in the last 168 hours. BNP (last 3 results) No results for input(s): PROBNP in the last 8760 hours. HbA1C: No results for input(s): HGBA1C in the last 72 hours. CBG: No results for input(s): GLUCAP in the last 168 hours. Lipid Profile: No results for input(s): CHOL, HDL, LDLCALC, TRIG, CHOLHDL, LDLDIRECT in the last 72 hours. Thyroid Function Tests: No results for input(s): TSH, T4TOTAL, FREET4, T3FREE, THYROIDAB in the last 72 hours. Anemia Panel: No results for input(s): VITAMINB12, FOLATE, FERRITIN, TIBC, IRON, RETICCTPCT in the last 72 hours. Sepsis Labs: Recent Labs  Lab 10/26/19 1845 10/26/19 2145 10/28/19 0118 10/30/19 0441 10/31/19 0500  PROCALCITON  --   --   --  0.52 0.25  LATICACIDVEN 0.7 0.8 0.5 1.2  --     Recent Results (from the past 240 hour(s))  Culture, blood (routine x 2)     Status: None   Collection Time: 10/25/19  7:34 AM   Specimen: BLOOD LEFT HAND  Result Value Ref Range Status   Specimen Description BLOOD LEFT HAND  Final    Special Requests   Final    BOTTLES DRAWN AEROBIC ONLY Blood Culture results may not be optimal due to an inadequate volume of blood received in culture bottles   Culture   Final    NO GROWTH 5 DAYS Performed at [facility], [place] [address], [place], [place] [address]    Report Status 10/30/2019 FINAL  Final  Culture, blood (routine x 2)     Status: None   Collection Time: 10/25/19  7:25 PM   Specimen: Site Not Specified; Blood  Result Value Ref Range Status   Specimen Description SITE NOT SPECIFIED  Final   Special Requests   Final    BOTTLES DRAWN AEROBIC AND ANAEROBIC Blood Culture adequate volume   Culture   Final    NO GROWTH 5 DAYS Performed at [facility], [place] [address], [place], [place] [address]    Report Status 10/30/2019 FINAL  Final  Culture, blood (routine x 2)     Status: None   Collection Time: 10/27/19 11:15 PM   Specimen: BLOOD RIGHT HAND  Result Value Ref Range Status   Specimen Description BLOOD RIGHT HAND  Final   Special Requests   Final    AEROBIC BOTTLE ONLY Blood Culture results may not be optimal due to an inadequate volume of blood received in culture bottles   Culture   Final    NO GROWTH 5 DAYS Performed at [facility], [place] [address], [place], [place] [address]    Report Status 11/01/2019 FINAL  Final  Culture, blood (routine x 2)     Status: None   Collection Time: 10/27/19 11:20 PM   Specimen: BLOOD LEFT HAND  Result Value Ref Range Status   Specimen Description BLOOD LEFT HAND  Final   Special Requests AEROBIC BOTTLE ONLY Blood Culture adequate volume  Final   Culture   Final    NO GROWTH 5 DAYS Performed at [facility], [place] [address], [place], [place] [address]    Report Status 11/01/2019 FINAL  Final  Cath Tip Culture     Status: None  Collection Time: 10/28/19  3:39 AM   Specimen: Catheter Tip  Result Value Ref Range Status   Specimen Description CATH TIP  Final   Special Requests NONE  Final   Culture    Final    NO GROWTH 3 DAYS Performed at [facility], [address] [place], [place] [address]    Report Status 10/31/2019 FINAL  Final  Gastrointestinal Panel by PCR , Stool     Status: None   Collection Time: 10/28/19  1:30 PM   Specimen: Stool  Result Value Ref Range Status   Campylobacter species NOT DETECTED NOT DETECTED Final   Plesimonas shigelloides NOT DETECTED NOT DETECTED Final   Salmonella species NOT DETECTED NOT DETECTED Final   Yersinia enterocolitica NOT DETECTED NOT DETECTED Final   Vibrio species NOT DETECTED NOT DETECTED Final   Vibrio cholerae NOT DETECTED NOT DETECTED Final   Enteroaggregative E coli (EAEC) NOT DETECTED NOT DETECTED Final   Enteropathogenic E coli (EPEC) NOT DETECTED NOT DETECTED Final   Enterotoxigenic E coli (ETEC) NOT DETECTED NOT DETECTED Final   Shiga like toxin producing E coli (STEC) NOT DETECTED NOT DETECTED Final   Shigella/Enteroinvasive E coli (EIEC) NOT DETECTED NOT DETECTED Final   Cryptosporidium NOT DETECTED NOT DETECTED Final   Cyclospora cayetanensis NOT DETECTED NOT DETECTED Final   Entamoeba histolytica NOT DETECTED NOT DETECTED Final   Giardia lamblia NOT DETECTED NOT DETECTED Final   Adenovirus F40/41 NOT DETECTED NOT DETECTED Final   Astrovirus NOT DETECTED NOT DETECTED Final   Norovirus GI/GII NOT DETECTED NOT DETECTED Final   Rotavirus A NOT DETECTED NOT DETECTED Final   Sapovirus (I, II, IV, and V) NOT DETECTED NOT DETECTED Final    Comment: Performed at [facility], [address] [place], [place] [address]  MRSA PCR Screening     Status: None   Collection Time: 10/28/19  1:30 PM   Specimen: Nasal Mucosa; Nasopharyngeal  Result Value Ref Range Status   MRSA by PCR NEGATIVE NEGATIVE Final    Comment:        The GeneXpert MRSA Assay (FDA approved for NASAL specimens only), is one component of a comprehensive MRSA colonization surveillance program. It is not intended to diagnose MRSA infection  nor to guide or monitor treatment for MRSA infections. Performed at [facility], [address] [place], [place] [address]          Radiology Studies: No results found.      Scheduled Meds: . Chlorhexidine Gluconate Cloth  6 each Topical Daily  . dextromethorphan-guaiFENesin  2 tablet Oral BID  . dicyclomine  10 mg Oral TID AC & HS  . ferrous sulfate  325 mg Oral Q breakfast  . metoprolol tartrate  12.5 mg Oral TID  .  morphine injection  1 mg Intravenous Once  . pantoprazole sodium  40 mg Oral Daily  . phenobarbital  97.2 mg Oral QHS  . sucralfate  1 g Oral TID WC & HS   Continuous Infusions: . ceFEPime (MAXIPIME) IV 2 g (11/02/19 1247)  . dextrose 5 % and 0.9% NaCl 50 mL/hr at 11/01/19 2108  . diltiazem (CARDIZEM) infusion 10 mg/hr (10/27/19 1900)  . heparin 1,000 Units/hr (11/01/19 1827)  . metronidazole 500 mg (11/02/19 1016)  . vancomycin 1,000 mg (11/02/19 1252)     LOS: 19 days   Total time spent for this encounter is 25 minutes     Aquilla HackerFelix O Cesare Sumlin, MD [facility] [phone]  If 7PM-7AM, please contact night-coverage  11/02/2019, 1:45 PM

## 2019-11-03 ENCOUNTER — Inpatient Hospital Stay (HOSPITAL_COMMUNITY): Payer: Medicaid Other

## 2019-11-03 LAB — CBC
HCT: 28.5 % — ABNORMAL LOW (ref 36.0–46.0)
Hemoglobin: 10 g/dL — ABNORMAL LOW (ref 12.0–15.0)
MCH: 28.1 pg (ref 26.0–34.0)
MCHC: 35.1 g/dL (ref 30.0–36.0)
MCV: 80.1 fL (ref 80.0–100.0)
Platelets: 335 10*3/uL (ref 150–400)
RBC: 3.56 MIL/uL — ABNORMAL LOW (ref 3.87–5.11)
RDW: 14.4 % (ref 11.5–15.5)
WBC: 13.6 10*3/uL — ABNORMAL HIGH (ref 4.0–10.5)
nRBC: 0.1 % (ref 0.0–0.2)

## 2019-11-03 LAB — COMPREHENSIVE METABOLIC PANEL
ALT: 94 U/L — ABNORMAL HIGH (ref 0–44)
AST: 101 U/L — ABNORMAL HIGH (ref 15–41)
Albumin: 2.7 g/dL — ABNORMAL LOW (ref 3.5–5.0)
Alkaline Phosphatase: 67 U/L (ref 38–126)
Anion gap: 8 (ref 5–15)
BUN: <5 mg/dL — ABNORMAL LOW (ref 6–20)
CO2: 24 mmol/L (ref 22–32)
Calcium: 8.8 mg/dL — ABNORMAL LOW (ref 8.9–10.3)
Chloride: 107 mmol/L (ref 98–111)
Creatinine, Ser: 0.52 mg/dL (ref 0.44–1.00)
GFR calc Af Amer: >60 mL/min (ref >60)
GFR calc non Af Amer: >60 mL/min (ref >60)
Glucose, Bld: 141 mg/dL — ABNORMAL HIGH (ref 70–99)
Potassium: 3.2 mmol/L — ABNORMAL LOW (ref 3.5–5.1)
Sodium: 139 mmol/L (ref 135–145)
Total Bilirubin: 0.6 mg/dL (ref 0.3–1.2)
Total Protein: 6.2 g/dL — ABNORMAL LOW (ref 6.5–8.1)

## 2019-11-03 LAB — HEPARIN LEVEL (UNFRACTIONATED): Heparin Unfractionated: 0.57 IU/mL (ref 0.30–0.70)

## 2019-11-03 LAB — MAGNESIUM: Magnesium: 1.6 mg/dL — ABNORMAL LOW (ref 1.7–2.4)

## 2019-11-03 LAB — PHOSPHORUS: Phosphorus: 3.7 mg/dL (ref 2.5–4.6)

## 2019-11-03 MED ORDER — IOHEXOL 300 MG/ML  SOLN
100.0000 mL | Freq: Once | INTRAMUSCULAR | Status: AC | PRN
  Administered 2019-11-03: 100 mL via INTRAVENOUS

## 2019-11-03 NOTE — Progress Notes (Signed)
ANTICOAGULATION CONSULT NOTE  Pharmacy Consult: Heparin Indication: DVT   No Active Allergies  Patient Measurements: Height: 5\' 4"  (162.6 cm) Weight: 179 lb 3.7 oz (81.3 kg) IBW/kg (Calculated) : 54.7 Heparin Dosing Weight: 70kg  Vital Signs: Temp: 98.6 F (37 C) (11/07 0513) Temp Source: Axillary (11/07 0513) BP: 129/81 (11/07 0513) Pulse Rate: 96 (11/07 0513)  Labs: Recent Labs    11/01/19 0500 11/01/19 1500 11/02/19 0630 11/02/19 0848 11/03/19 0247  HGB 9.9*  --  9.9*  --  10.0*  HCT 28.1*  --  27.4*  --  28.5*  PLT 379  --  345  --  335  APTT 148*  --   --   --   --   HEPARINUNFRC 0.52  --   --  0.62 0.57  CREATININE 0.48 0.55 0.55  --  0.52    Estimated Creatinine Clearance: 103.1 mL/min (by C-G formula based on SCr of 0.52 mg/dL).   Assessment: 89 YOF with acute DVT, started on lovenox and transitioned to Eliquis. Now with possible VP shunt infection and transitioned to heparin for procedures. Last dose of Eliquis administered 10/31 at 2250.  Heparin level remains therapeutic at 0.57 on heparin 1000 units/hr. CBC stable. No active bleed issues documented.  Goal of Therapy:  Heparin level 0.3-0.7 units/ml Monitor platelets by anticoagulation protocol: Yes    Plan:  Continue heparin IV at 1000 units/hr Monitor daily heparin level and CBC, s/sx bleeding F/U with switching back to [place] as appropriate  Cristela Felt, PharmD PGY1 Pharmacy Resident [employer]: [phone]   11/03/2019 7:41 AM

## 2019-11-03 NOTE — Progress Notes (Signed)
Patient ID: Tracey Graham, female   DOB: 04-Dec-1986, 33 y.o.   MRN: [id number] Afebrile stable vital signs continuing on IV antibiotics awaiting abdominal CT

## 2019-11-03 NOTE — Progress Notes (Signed)
Spoke with RN and made him aware that he can change the patient's central line dsg

## 2019-11-03 NOTE — Progress Notes (Signed)
PROGRESS NOTE    Tracey HomesVanessa Graham  ZOX:[id number] DOB: 10/18/1986 DOA: 10/13/2019 PCP: Clinic, General Medical   Brief Narrative:  33 year old with history of cerebral palsy, wheelchair-bound/fully dependent on ADL S, VP shunt for hydrocephalus, seizure disorder, intellectual disability came to the ER with complaints of fever abdominal pain.  Initial SIRS, COVID-19-negative.  CT of the abdomen pelvis was overall negative.  CT head-negative.  Work-up showed left lower extremity acute DVT started on anticoagulation.  LP initially performed 10/21 which showed pleocytosis, elevated protein and lymphocytic predominance without any bacteria.  Initial thoughts were DVT causing her fevers.  Aseptic meningitis-treated with 3 days of Decadron.  Course further complicated by abdominal pain with GI consultation.  EGD on 10/29 showed esophagitis and gastric ulcers for which GI recommended PPI twice daily.  No biopsies were taken as patient was on Eliquis.  Repeat CT of the abdomen pelvis with contrast showed loculated fluid collection at the tip of the VP shunt raising concerns for VP shunt infection.  Neurosurgery consulted, repeat LP performed on 10/30/2019.   Patient is currently on antibiotics, Vanco, cefepime and metronidazole  Awaiting report of repeat CT scan of abdomen and pelvis with contrast.   Neurosurgery managing.  Assessment & Plan:   Principal Problem:   Infection of VP (ventriculoperitoneal) shunt (HCC) Active Problems:   Hydrocephalus (HCC)   VP (ventriculoperitoneal) shunt status   Encephalopathy   Abdominal pain   Nuchal rigidity   Aseptic meningitis   Constipation   Deep vein thrombosis (DVT) of femoral vein of left lower extremity (HCC)   AMS (altered mental status)  Sepsis of unclear etiology, could be multiple reasons. Recurrent fevers, unknown etiology Suspicion for multifocal pneumonia versus VP shunt infection nfection possibly infected shunt..  Cultures remain negative, GI  panel negative.  CT of the abdomen pelvis with contrast on 10/30 showed loculated fluid around tip of VP shunt in the left anterior pelvis.  Neurosurgery following. -Initial LP 10/17/2019-unrevealing.                                                         . -Repeat CT of the abdomen pelvis 11/03/2019. Neurosurgery on board  Acute hypoxic respiratory failure secondary to multifocal pneumonia -Chest x-ray showed bilateral pulmonary infiltrates at the bases. -Currently on IV vancomycin, cefepime, Flagyl.  Breathing has remained stable and not on O2. -Infectious disease team following. -Aggressive bronchodilators.  Incentive spirometer and flutter valve if she uses it.  Abdominal discomfort, nonspecific -Diminished bowel sounds.  Possible could be developing ileus. Abdominal x-ray showed ileus versus possible small bowel obstruction. Will obtain CT abdomen and pelvis on 11/03/2019 to further evaluate   Persistent sinus tachycardia with uncontrolled elevated blood pressure.   Possibly inappropriate sinus tachycardia versus ongoing infection. May consider ivabradine if infection is ruled out and heart rate is not controlled beta-blocker or calcium channel blocker.  Esophagitis and nonbleeding gastric ulcers Continue with Protonix twice daily Continue with Carafate 4 times daily.   Acute left lower extremity DVT -Continue anticoagulation.  Iron deficiency anemia -Iron supplements.  Bowel regimen.  Dysphagia -Aspiration precaution  Seizure disorder Continue with antiepileptic drugs .  DVT prophylaxis: Heparin drip Code Status: Full code Family Communication: Mother at bedside Disposition Plan: Pending clinical improvement   Consultants:   Neurosurgery  Infectious disease  Subjective: Seen and examined at bedside.  Mother at bedside.  Patient is awake but not verbalizing .  Not in acute distress.  Has no new complaints.  Mother stated that patient had a rectal tube placed  overnight.  No acute overnight event was reported. Review of Systems Otherwise negative except as per HPI, including: Difficult to obtain from her  Objective: Vitals:   11/03/19 0513 11/03/19 0800 11/03/19 0900 11/03/19 1100  BP: 129/81  110/68 129/72  Pulse: 96  (!) 110 (!) 102  Resp: 19  19 (!) 26  Temp: 98.6 F (37 C) 98.5 F (36.9 C)    TempSrc: Axillary Axillary    SpO2: 95%  98% 93%  Weight:      Height:        Intake/Output Summary (Last 24 hours) at 11/03/2019 1516 Last data filed at 11/03/2019 0711 Gross per 24 hour  Intake 300 ml  Output 2450 ml  Net -2150 ml   Filed Weights   10/13/19 2006 10/14/19 0237 10/31/19 2122  Weight: 74.4 kg 74.7 kg 81.3 kg    Examination:  General exam: Slight distress due to abdominal discomfort Respiratory system: Diminished at bases Cardiovascular system: S1 & S2 heard, RRR. No JVD, murmurs, rubs, gallops or clicks. No pedal edema. Gastrointestinal system: Abdomen mildly distended.  Diminished bowel sounds. Central nervous system: Alert to her name only which is at baseline Extremities: Contracted upper extremities Skin: No rashes, lesions or ulcers Psychiatry: Poor judgment and insight  External urinary catheter Right neck triple-lumen catheter 10/27/2019.  Data Reviewed:   CBC: Recent Labs  Lab 10/30/19 0441 10/31/19 0500 11/01/19 0500 11/02/19 0630 11/03/19 0247  WBC 14.5* 16.2* 16.5* 14.6* 13.6*  HGB 9.8* 10.3* 9.9* 9.9* 10.0*  HCT 27.7* 28.9* 28.1* 27.4* 28.5*  MCV 80.5 80.1 78.9* 79.4* 80.1  PLT 347 355 379 345 335   Basic Metabolic Panel: Recent Labs  Lab 10/28/19 1025  10/31/19 0500 11/01/19 0500 11/01/19 1500 11/02/19 0630 11/03/19 0247  NA  --    < > 141 140 138 139 139  K  --    < > 3.4* 2.8* 3.6 3.3* 3.2*  CL  --    < > 109 107 107 108 107  CO2  --    < > GLUCOSE  --    < > 92 110* 107* 118* 141*  BUN  --    < > <5* <5* <5* <5* <5*  CREATININE  --    < > 0.46 0.48 0.55 0.55  0.52  CALCIUM  --    < > 8.3* 8.5* 8.4* 8.4* 8.8*  MG 2.4  --   --  1.4*  --  1.7 1.6*  PHOS  --   --   --   --   --   --  3.7   < > = values in this interval not displayed.   GFR: Estimated Creatinine Clearance: 103.1 mL/min (by C-G formula based on SCr of 0.52 mg/dL). Liver Function Tests: Recent Labs  Lab 10/28/19 0118 10/30/19 0441 11/03/19 0247  AST 22 20 101*  ALT 25 28 94*  ALKPHOS 52 55 67  BILITOT 1.1 0.6 0.6  PROT 6.0* 5.8* 6.2*  ALBUMIN 3.3* 2.8* 2.7*   No results for input(s): LIPASE, AMYLASE in the last 168 hours. No results for input(s): AMMONIA in the last 168 hours. Coagulation Profile: No results for input(s): INR, PROTIME in the last 168 hours. Cardiac Enzymes:  No results for input(s): CKTOTAL, CKMB, CKMBINDEX, TROPONINI in the last 168 hours. BNP (last 3 results) No results for input(s): PROBNP in the last 8760 hours. HbA1C: No results for input(s): HGBA1C in the last 72 hours. CBG: No results for input(s): GLUCAP in the last 168 hours. Lipid Profile: No results for input(s): CHOL, HDL, LDLCALC, TRIG, CHOLHDL, LDLDIRECT in the last 72 hours. Thyroid Function Tests: No results for input(s): TSH, T4TOTAL, FREET4, T3FREE, THYROIDAB in the last 72 hours. Anemia Panel: No results for input(s): VITAMINB12, FOLATE, FERRITIN, TIBC, IRON, RETICCTPCT in the last 72 hours. Sepsis Labs: Recent Labs  Lab 10/28/19 0118 10/30/19 0441 10/31/19 0500  PROCALCITON  --  0.52 0.25  LATICACIDVEN 0.5 1.2  --     Recent Results (from the past 240 hour(s))  Culture, blood (routine x 2)     Status: None   Collection Time: 10/25/19  7:34 AM   Specimen: BLOOD LEFT HAND  Result Value Ref Range Status   Specimen Description BLOOD LEFT HAND  Final   Special Requests   Final    BOTTLES DRAWN AEROBIC ONLY Blood Culture results may not be optimal due to an inadequate volume of blood received in culture bottles   Culture   Final    NO GROWTH 5 DAYS Performed at [facility], [place] [address], [place], [place] [address]    Report Status 10/30/2019 FINAL  Final  Culture, blood (routine x 2)     Status: None   Collection Time: 10/25/19  7:25 PM   Specimen: Site Not Specified; Blood  Result Value Ref Range Status   Specimen Description SITE NOT SPECIFIED  Final   Special Requests   Final    BOTTLES DRAWN AEROBIC AND ANAEROBIC Blood Culture adequate volume   Culture   Final    NO GROWTH 5 DAYS Performed at [facility], [place] [address] [place], [place] [address]    Report Status 10/30/2019 FINAL  Final  Culture, blood (routine x 2)     Status: None   Collection Time: 10/27/19 11:15 PM   Specimen: BLOOD RIGHT HAND  Result Value Ref Range Status   Specimen Description BLOOD RIGHT HAND  Final   Special Requests   Final    AEROBIC BOTTLE ONLY Blood Culture results may not be optimal due to an inadequate volume of blood received in culture bottles   Culture   Final    NO GROWTH 5 DAYS Performed at [facility], [place] [address], [place], [place] [address]    Report Status 11/01/2019 FINAL  Final  Culture, blood (routine x 2)     Status: None   Collection Time: 10/27/19 11:20 PM   Specimen: BLOOD LEFT HAND  Result Value Ref Range Status   Specimen Description BLOOD LEFT HAND  Final   Special Requests AEROBIC BOTTLE ONLY Blood Culture adequate volume  Final   Culture   Final    NO GROWTH 5 DAYS Performed at [facility], [place] [address] [place], [place] [address]    Report Status 11/01/2019 FINAL  Final  Cath Tip Culture     Status: None   Collection Time: 10/28/19  3:39 AM   Specimen: Catheter Tip  Result Value Ref Range Status   Specimen Description CATH TIP  Final   Special Requests NONE  Final   Culture   Final    NO GROWTH 3 DAYS Performed at [facility], [place]  [address], [place], [place] [address]    Report Status 10/31/2019 FINAL  Final  Gastrointestinal Panel by PCR , Stool     Status: None   Collection Time:  10/28/19  1:30 PM   Specimen: Stool  Result Value Ref Range Status   Campylobacter species NOT DETECTED NOT DETECTED Final   Plesimonas shigelloides NOT DETECTED NOT DETECTED Final   Salmonella species NOT DETECTED NOT DETECTED Final   Yersinia enterocolitica NOT DETECTED NOT DETECTED Final   Vibrio species NOT DETECTED NOT DETECTED Final   Vibrio cholerae NOT DETECTED NOT DETECTED Final   Enteroaggregative E coli (EAEC) NOT DETECTED NOT DETECTED Final   Enteropathogenic E coli (EPEC) NOT DETECTED NOT DETECTED Final   Enterotoxigenic E coli (ETEC) NOT DETECTED NOT DETECTED Final   Shiga like toxin producing E coli (STEC) NOT DETECTED NOT DETECTED Final   Shigella/Enteroinvasive E coli (EIEC) NOT DETECTED NOT DETECTED Final   Cryptosporidium NOT DETECTED NOT DETECTED Final   Cyclospora cayetanensis NOT DETECTED NOT DETECTED Final   Entamoeba histolytica NOT DETECTED NOT DETECTED Final   Giardia lamblia NOT DETECTED NOT DETECTED Final   Adenovirus F40/41 NOT DETECTED NOT DETECTED Final   Astrovirus NOT DETECTED NOT DETECTED Final   Norovirus GI/GII NOT DETECTED NOT DETECTED Final   Rotavirus A NOT DETECTED NOT DETECTED Final   Sapovirus (I, II, IV, and V) NOT DETECTED NOT DETECTED Final    Comment: Performed at [facility], [address] [place], [place] [address]  MRSA PCR Screening     Status: None   Collection Time: 10/28/19  1:30 PM   Specimen: Nasal Mucosa; Nasopharyngeal  Result Value Ref Range Status   MRSA by PCR NEGATIVE NEGATIVE Final    Comment:        The GeneXpert MRSA Assay (FDA approved for NASAL specimens only), is one component of a comprehensive MRSA colonization surveillance program. It is not intended to diagnose MRSA infection nor to guide or monitor treatment for MRSA infections. Performed at [facility], [address] [place], [place] [address]          Radiology Studies: No results found.      Scheduled Meds: .  Chlorhexidine Gluconate Cloth  6 each Topical Daily  . dextromethorphan-guaiFENesin  2 tablet Oral BID  . dicyclomine  10 mg Oral TID AC & HS  . ferrous sulfate  325 mg Oral Q breakfast  . metoprolol tartrate  12.5 mg Oral TID  . pantoprazole sodium  40 mg Oral Daily  . phenobarbital  97.2 mg Oral QHS  . sucralfate  1 g Oral TID WC & HS   Continuous Infusions: . ceFEPime (MAXIPIME) IV 2 g (11/03/19 1204)  . dextrose 5 % and 0.9% NaCl 50 mL/hr at 11/02/19 2016  . heparin 1,000 Units/hr (11/02/19 2026)  . metronidazole 500 mg (11/03/19 1220)  . vancomycin 1,000 mg (11/03/19 0128)     LOS: 20 days   Total time spent for this encounter is 25 minutes     Aquilla Hacker, MD [facility] [phone] If 7PM-7AM, please contact night-coverage  11/03/2019, 3:16 PM

## 2019-11-04 LAB — HEPARIN LEVEL (UNFRACTIONATED)
Heparin Unfractionated: 0.81 IU/mL — ABNORMAL HIGH (ref 0.30–0.70)
Heparin Unfractionated: <0.1 IU/mL — ABNORMAL LOW (ref 0.30–0.70)

## 2019-11-04 LAB — CBC
HCT: 29.9 % — ABNORMAL LOW (ref 36.0–46.0)
Hemoglobin: 10.4 g/dL — ABNORMAL LOW (ref 12.0–15.0)
MCH: 28.1 pg (ref 26.0–34.0)
MCHC: 34.8 g/dL (ref 30.0–36.0)
MCV: 80.8 fL (ref 80.0–100.0)
Platelets: 354 10*3/uL (ref 150–400)
RBC: 3.7 MIL/uL — ABNORMAL LOW (ref 3.87–5.11)
RDW: 14.8 % (ref 11.5–15.5)
WBC: 14 10*3/uL — ABNORMAL HIGH (ref 4.0–10.5)
nRBC: 0 % (ref 0.0–0.2)

## 2019-11-04 LAB — VANCOMYCIN, PEAK: Vancomycin Pk: 55 ug/mL (ref 30–40)

## 2019-11-04 LAB — VANCOMYCIN, TROUGH: Vancomycin Tr: 22 ug/mL (ref 15–20)

## 2019-11-04 LAB — MAGNESIUM: Magnesium: 1.6 mg/dL — ABNORMAL LOW (ref 1.7–2.4)

## 2019-11-04 MED ORDER — VANCOMYCIN HCL IN DEXTROSE 750-5 MG/150ML-% IV SOLN
750.0000 mg | Freq: Two times a day (BID) | INTRAVENOUS | Status: AC
  Administered 2019-11-04 – 2019-11-10 (×13): 750 mg via INTRAVENOUS
  Filled 2019-11-04 (×14): qty 150

## 2019-11-04 NOTE — Progress Notes (Signed)
ANTICOAGULATION CONSULT NOTE  Pharmacy Consult: Heparin Indication: DVT   No Active Allergies  Patient Measurements: Height: 5\' 4"  (162.6 cm) Weight: 179 lb 3.7 oz (81.3 kg) IBW/kg (Calculated) : 54.7 Heparin Dosing Weight: 70kg  Vital Signs: Temp: 98.2 F (36.8 C) (11/08 0000) Temp Source: Oral (11/08 0000) BP: 136/92 (11/08 0000) Pulse Rate: 89 (11/08 0000)  Labs: Recent Labs    11/01/19 1500  11/02/19 0630 11/02/19 0848 11/03/19 0247 11/04/19 0602  HGB  --    < > 9.9*  --  10.0* 10.4*  HCT  --   --  27.4*  --  28.5* 29.9*  PLT  --   --  345  --  335 354  HEPARINUNFRC  --   --   --  0.62 0.57  --   CREATININE 0.55  --  0.55  --  0.52  --    < > = values in this interval not displayed.    Estimated Creatinine Clearance: 103.1 mL/min (by C-G formula based on SCr of 0.52 mg/dL).   Assessment: 76 YOF with acute DVT, started on lovenox and transitioned to Eliquis. Now with possible VP shunt infection and transitioned to heparin for procedures. Last dose of Eliquis administered 10/31 at 2250.  Heparin level of 0.81 is supratherapeutic on heparin 1000 units/hr. Of note, heparin level scheduled for 0500 this morning was delayed per phlebotomy due to patient being a difficult stick. Per nursing, heparin level was drawn peripherally and heparin is infusing through the patient's central line. No active bleed issues documented. CBC stable.   Goal of Therapy:  Heparin level 0.3-0.7 units/ml Monitor platelets by anticoagulation protocol: Yes    Plan:  Decrease heparin to 950 units/hr  Check heparin level at 1600 Change heparin to infuse peripherally and draw heparin level from central line to assist lab  Monitor daily heparin level and CBC, s/sx bleeding F/U with switching back to [place] as appropriate  Cristela Felt, PharmD PGY1 Pharmacy Resident [employer]: [phone]   11/04/2019 7:45 AM

## 2019-11-04 NOTE — Progress Notes (Signed)
Pharmacy Antibiotic Note  Tracey Graham is a 33 y.o. female admitted on 10/13/2019 with VP shunt infection.  Pharmacy has been consulted for vancomycin/cefepime dosing. Also on Flagyl per MD. Vancomycin dosing was transitioned to dosing per nomogram due to concern for possible aseptic meningitis. SCr stable at 0.48.  11/8 AM update:  Vancomycin trough is elevated at 22 Not using AUC dosing due to concern for possible meningitis  Renal function stable  Plan: Dec vancomycin to 750 mg IV q12h Cont Cefepime 2g IV q8h Cont Flagyl 500mg  IV q8h Monitor clinical progress, renal function F/u de-escalation plan/LOT, vancomycin trough at new Css   Height: 5\' 4"  (162.6 cm) Weight: 179 lb 3.7 oz (81.3 kg) IBW/kg (Calculated) : 54.7  Temp (24hrs), Avg:98.8 F (37.1 C), Min:98.2 F (36.8 C), Max:100 F (37.8 C)  Recent Labs  Lab 10/30/19 0441 10/31/19 0500 11/01/19 0500 11/01/19 1100 11/01/19 1500 11/02/19 0630 11/03/19 0247 11/03/19 2350 11/04/19 0206  WBC 14.5* 16.2* 16.5*  --   --  14.6* 13.6*  --   --   CREATININE 0.59 0.46 0.48  --  0.55 0.55 0.52  --   --   LATICACIDVEN 1.2  --   --   --   --   --   --   --   --   VANCOTROUGH  --   --   --  21*  --   --   --  22*  --   VANCOPEAK  --   --   --   --   --   --   --   --  55*    Estimated Creatinine Clearance: 103.1 mL/min (by C-G formula based on SCr of 0.52 mg/dL).    No Active Allergies  Antimicrobials this admission: Unasyn 10/30>10/31 Azith 10/30> 11/2 Vanc 10/31 >> Cefepime 10/31 >> Flagyl 10/31 >>  Microbiology results: 10/17 BCx - negative 10/21 CSF - negative (Lymps 88, Neut 8, gluc wnl, protein high) 10/21 HSV pcr - negative 10/23 BCx - negative 10/29 BCx - negative 10/31 BCx - NGTD 11/1 GI panel PCR - negative 11/1 cath tip - negative   [name], PharmD, BCPS Clinical Pharmacist Phone: [phone]

## 2019-11-04 NOTE — Progress Notes (Signed)
ANTICOAGULATION CONSULT NOTE - Follow-Up  Pharmacy Consult: Heparin Indication: DVT   No Active Allergies  Patient Measurements: Height: 5\' 4"  (162.6 cm) Weight: 179 lb 3.7 oz (81.3 kg) IBW/kg (Calculated) : 54.7 Heparin Dosing Weight: 70kg  Vital Signs: Temp: 99 F (37.2 C) (11/08 1617) Temp Source: Axillary (11/08 1617) BP: 133/93 (11/08 1617) Pulse Rate: 113 (11/08 1617)  Labs: Recent Labs    11/02/19 0630  11/03/19 0247 11/04/19 0602 11/04/19 0807 11/04/19 1658  HGB 9.9*  --  10.0* 10.4*  --   --   HCT 27.4*  --  28.5* 29.9*  --   --   PLT 345  --  335 354  --   --   HEPARINUNFRC  --    < > 0.57  --  0.81* <0.10*  CREATININE 0.55  --  0.52  --   --   --    < > = values in this interval not displayed.    Estimated Creatinine Clearance: 103.1 mL/min (by C-G formula based on SCr of 0.52 mg/dL).   Assessment: 59 YOF with acute DVT, started on lovenox and transitioned to Eliquis. Now with possible VP shunt infection and transitioned to heparin for procedures. Last dose of Eliquis administered 10/31 at 2250.  Heparin level now undetectable.  Heparin infusion was switched to peripheral IV earlier today so that labs could be obtained from central line access.  The lab result is surprising given previous higher levels.  Confirmed with RN that heparin is infusing appropriately.  Will order small rate increase.  Goal of Therapy:  Heparin level 0.3-0.7 units/ml Monitor platelets by anticoagulation protocol: Yes    Plan:  Increase heparin to 1050 units/hr  Check heparin level at 0200 11/9. Monitor daily heparin level and CBC, s/sx bleeding F/U with switching back to DOAC as appropriate  [employer], Pharm.D., BCPS Clinical Pharmacist  **Pharmacist phone directory can now be found on [email or web address] ([email or web address]).  Listed under [place].  11/04/2019 6:48 PM

## 2019-11-04 NOTE — Progress Notes (Signed)
PROGRESS NOTE    Tracey Graham  GNF:[id number] DOB: 1986-05-27 DOA: 10/13/2019 PCP: Clinic, General Medical   Brief Narrative:  33 year old with history of cerebral palsy, wheelchair-bound/fully dependent on ADL S, VP shunt for hydrocephalus, seizure disorder, intellectual disability came to the ER with complaints of fever abdominal pain.  Initial SIRS, COVID-19-negative.  CT of the abdomen pelvis was overall negative.  CT head-negative.  Work-up showed left lower extremity acute DVT started on anticoagulation.  LP initially performed 10/21 which showed pleocytosis, elevated protein and lymphocytic predominance without any bacteria.  Initial thoughts were DVT causing her fevers.  Aseptic meningitis-treated with 3 days of Decadron.  Course further complicated by abdominal pain with GI consultation.  EGD on 10/29 showed esophagitis and gastric ulcers for which GI recommended PPI twice daily.  No biopsies were taken as patient was on Eliquis.  Repeat CT of the abdomen pelvis with contrast showed loculated fluid collection at the tip of the VP shunt raising concerns for VP shunt infection.  Neurosurgery consulted, repeat LP performed on 10/30/2019.   Patient is currently on antibiotics, Vanco, cefepime and metronidazole  Repeat CT abdomen and pelvis with contrast done on 11/03/2019 showed identification of intra-abdominal shunt with 1 position in the lower right hemiabdomen and second portion in the upper right hemiabdomen anterior to cecum.  The fluid collection around the upper hemiabdominal catheter has resolved.  Small volume ascites in the abdomen and pelvis.  Thickened, inflamed appearance of the distal colon and rectum which has improved consistent with resolving infection inflammatory colitis.  Assessment & Plan:   Principal Problem:   Infection of VP (ventriculoperitoneal) shunt (HCC) Active Problems:   Hydrocephalus (HCC)   VP (ventriculoperitoneal) shunt status   Encephalopathy  Abdominal pain   Nuchal rigidity   Aseptic meningitis   Constipation   Deep vein thrombosis (DVT) of femoral vein of left lower extremity (HCC)   AMS (altered mental status)  Sepsis of unclear etiology, could be multiple reasons. Recurrent fevers, unknown etiology Suspicion for multifocal pneumonia versus VP shunt infection nfection possibly infected shunt..  Cultures remain negative, GI panel negative.  CT of the abdomen pelvis with contrast on 10/30 showed loculated fluid around tip of VP shunt in the left anterior pelvis.  Neurosurgery following. -Initial LP 10/17/2019-unrevealing.                                                         . -Repeat CT of the abdomen pelvis 11/03/2019 showed improvement in fluid collection and infectious/inflammatory colitis likely from antibiotic therapy. Neurosurgery on board  Acute hypoxic respiratory failure secondary to multifocal pneumonia -Chest x-ray showed bilateral pulmonary infiltrates at the bases. -Currently on IV vancomycin, cefepime, Flagyl.  Breathing has remained stable and not on O2. -Infectious disease team following. -Aggressive bronchodilators.  Incentive spirometer and flutter valve if she uses it.  Abdominal discomfort, nonspecific -Diminished bowel sounds.  Possible could be developing ileus. Abdominal x-ray showed ileus versus possible small bowel obstruction. Will obtain CT abdomen and pelvis on 11/03/2019 to further evaluate   Persistent sinus tachycardia with uncontrolled elevated blood pressure.   Possibly inappropriate sinus tachycardia versus ongoing infection. May consider ivabradine if infection is ruled out and heart rate is not controlled beta-blocker or calcium channel blocker.  Esophagitis and nonbleeding gastric ulcers Continue with Protonix twice  daily Continue with Carafate 4 times daily.   Acute left lower extremity DVT -Continue anticoagulation.  Iron deficiency anemia -Iron supplements.  Bowel regimen.   Dysphagia -Aspiration precaution  Seizure disorder Continue with antiepileptic drugs .  DVT prophylaxis: Heparin drip Code Status: Full code Family Communication: Mother at bedside Disposition Plan: Pending clinical improvement   Consultants:   Neurosurgery  Infectious disease   Subjective: Seen and examined at bedside.  Mother at bedside.  Patient is awake but not verbalizing .  Not in acute distress.  Has no new complaints.  Mother stated that patient had a rectal tube placed overnight.  No acute overnight event was reported. Review of Systems Otherwise negative except as per HPI, including: Difficult to obtain from her  Objective: Vitals:   11/04/19 0000 11/04/19 0757 11/04/19 0936 11/04/19 1221  BP: (!) 136/92 (!) 148/96 132/90 137/74  Pulse: 89 100 94 99  Resp: 19 (!) 21  13  Temp: 98.2 F (36.8 C) 98.8 F (37.1 C)  99 F (37.2 C)  TempSrc: Oral Axillary  Oral  SpO2: 99% 99%  99%  Weight:      Height:        Intake/Output Summary (Last 24 hours) at 11/04/2019 1605 Last data filed at 11/04/2019 1000 Gross per 24 hour  Intake 1709.42 ml  Output 1001 ml  Net 708.42 ml   Filed Weights   10/13/19 2006 10/14/19 0237 10/31/19 2122  Weight: 74.4 kg 74.7 kg 81.3 kg    Examination:  General exam: Slight distress due to abdominal discomfort Respiratory system: Diminished at bases Cardiovascular system: S1 & S2 heard, RRR. No JVD, murmurs, rubs, gallops or clicks. No pedal edema. Gastrointestinal system: Abdomen mildly distended.  Diminished bowel sounds. Central nervous system: Alert to her name only which is at baseline Extremities: Contracted upper extremities Skin: No rashes, lesions or ulcers Psychiatry: Poor judgment and insight  External urinary catheter Right neck triple-lumen catheter 10/27/2019.  Data Reviewed:   CBC: Recent Labs  Lab 10/31/19 0500 11/01/19 0500 11/02/19 0630 11/03/19 0247 11/04/19 0602  WBC 16.2* 16.5* 14.6* 13.6* 14.0*   HGB 10.3* 9.9* 9.9* 10.0* 10.4*  HCT 28.9* 28.1* 27.4* 28.5* 29.9*  MCV 80.1 78.9* 79.4* 80.1 80.8  PLT 355 379 345 335 354   Basic Metabolic Panel: Recent Labs  Lab 10/31/19 0500 11/01/19 0500 11/01/19 1500 11/02/19 0630 11/03/19 0247 11/04/19 0602  NA 141 140 138 139 139  --   K 3.4* 2.8* 3.6 3.3* 3.2*  --   CL 109 107 107 108 107  --   CO2 --   GLUCOSE 92 110* 107* 118* 141*  --   BUN <5* <5* <5* <5* <5*  --   CREATININE 0.46 0.48 0.55 0.55 0.52  --   CALCIUM 8.3* 8.5* 8.4* 8.4* 8.8*  --   MG  --  1.4*  --  1.7 1.6* 1.6*  PHOS  --   --   --   --  3.7  --    GFR: Estimated Creatinine Clearance: 103.1 mL/min (by C-G formula based on SCr of 0.52 mg/dL). Liver Function Tests: Recent Labs  Lab 10/30/19 0441 11/03/19 0247  AST 20 101*  ALT 28 94*  ALKPHOS 55 67  BILITOT 0.6 0.6  PROT 5.8* 6.2*  ALBUMIN 2.8* 2.7*   No results for input(s): LIPASE, AMYLASE in the last 168 hours. No results for input(s): AMMONIA in the last 168 hours. Coagulation Profile: No  results for input(s): INR, PROTIME in the last 168 hours. Cardiac Enzymes: No results for input(s): CKTOTAL, CKMB, CKMBINDEX, TROPONINI in the last 168 hours. BNP (last 3 results) No results for input(s): PROBNP in the last 8760 hours. HbA1C: No results for input(s): HGBA1C in the last 72 hours. CBG: No results for input(s): GLUCAP in the last 168 hours. Lipid Profile: No results for input(s): CHOL, HDL, LDLCALC, TRIG, CHOLHDL, LDLDIRECT in the last 72 hours. Thyroid Function Tests: No results for input(s): TSH, T4TOTAL, FREET4, T3FREE, THYROIDAB in the last 72 hours. Anemia Panel: No results for input(s): VITAMINB12, FOLATE, FERRITIN, TIBC, IRON, RETICCTPCT in the last 72 hours. Sepsis Labs: Recent Labs  Lab 10/30/19 0441 10/31/19 0500  PROCALCITON 0.52 0.25  LATICACIDVEN 1.2  --     Recent Results (from the past 240 hour(s))  Culture, blood (routine x 2)     Status: None    Collection Time: 10/25/19  7:25 PM   Specimen: Site Not Specified; Blood  Result Value Ref Range Status   Specimen Description SITE NOT SPECIFIED  Final   Special Requests   Final    BOTTLES DRAWN AEROBIC AND ANAEROBIC Blood Culture adequate volume   Culture   Final    NO GROWTH 5 DAYS Performed at [facility], [address] [place], [place] [address]    Report Status 10/30/2019 FINAL  Final  Culture, blood (routine x 2)     Status: None   Collection Time: 10/27/19 11:15 PM   Specimen: BLOOD RIGHT HAND  Result Value Ref Range Status   Specimen Description BLOOD RIGHT HAND  Final   Special Requests   Final    AEROBIC BOTTLE ONLY Blood Culture results may not be optimal due to an inadequate volume of blood received in culture bottles   Culture   Final    NO GROWTH 5 DAYS Performed at [facility], [address] [place], [place] [address]    Report Status 11/01/2019 FINAL  Final  Culture, blood (routine x 2)     Status: None   Collection Time: 10/27/19 11:20 PM   Specimen: BLOOD LEFT HAND  Result Value Ref Range Status   Specimen Description BLOOD LEFT HAND  Final   Special Requests AEROBIC BOTTLE ONLY Blood Culture adequate volume  Final   Culture   Final    NO GROWTH 5 DAYS Performed at [facility], [address] [place], [place] [address]    Report Status 11/01/2019 FINAL  Final  Cath Tip Culture     Status: None   Collection Time: 10/28/19  3:39 AM   Specimen: Catheter Tip  Result Value Ref Range Status   Specimen Description CATH TIP  Final   Special Requests NONE  Final   Culture   Final    NO GROWTH 3 DAYS Performed at [facility], [address] [place], [place] [address]    Report Status 10/31/2019 FINAL  Final  Gastrointestinal Panel by PCR , Stool     Status: None   Collection Time: 10/28/19  1:30 PM   Specimen: Stool  Result Value Ref Range Status   Campylobacter species NOT DETECTED NOT DETECTED Final   Plesimonas  shigelloides NOT DETECTED NOT DETECTED Final   Salmonella species NOT DETECTED NOT DETECTED Final   Yersinia enterocolitica NOT DETECTED NOT DETECTED Final   Vibrio species NOT DETECTED NOT DETECTED Final   Vibrio cholerae NOT DETECTED NOT DETECTED Final  Enteroaggregative E coli (EAEC) NOT DETECTED NOT DETECTED Final   Enteropathogenic E coli (EPEC) NOT DETECTED NOT DETECTED Final   Enterotoxigenic E coli (ETEC) NOT DETECTED NOT DETECTED Final   Shiga like toxin producing E coli (STEC) NOT DETECTED NOT DETECTED Final   Shigella/Enteroinvasive E coli (EIEC) NOT DETECTED NOT DETECTED Final   Cryptosporidium NOT DETECTED NOT DETECTED Final   Cyclospora cayetanensis NOT DETECTED NOT DETECTED Final   Entamoeba histolytica NOT DETECTED NOT DETECTED Final   Giardia lamblia NOT DETECTED NOT DETECTED Final   Adenovirus F40/41 NOT DETECTED NOT DETECTED Final   Astrovirus NOT DETECTED NOT DETECTED Final   Norovirus GI/GII NOT DETECTED NOT DETECTED Final   Rotavirus A NOT DETECTED NOT DETECTED Final   Sapovirus (I, II, IV, and V) NOT DETECTED NOT DETECTED Final    Comment: Performed at [facility], [address] [place], [place] [address]  MRSA PCR Screening     Status: None   Collection Time: 10/28/19  1:30 PM   Specimen: Nasal Mucosa; Nasopharyngeal  Result Value Ref Range Status   MRSA by PCR NEGATIVE NEGATIVE Final    Comment:        The GeneXpert MRSA Assay (FDA approved for NASAL specimens only), is one component of a comprehensive MRSA colonization surveillance program. It is not intended to diagnose MRSA infection nor to guide or monitor treatment for MRSA infections. Performed at [facility], [address], [place], [place] [address]          Radiology Studies: Ct Abdomen Pelvis W Contrast  Result Date: 11/03/2019 CLINICAL DATA:  Abdominal pain, fever, abscess suspected EXAM: CT ABDOMEN AND PELVIS WITH CONTRAST TECHNIQUE: Multidetector CT imaging of  the abdomen and pelvis was performed using the standard protocol following bolus administration of intravenous contrast. CONTRAST:  OMNIPAQUE IOHEXOL 300 MG/ML SOLN, additional oral enteric contrast COMPARISON:  10/26/2019 FINDINGS: Lower chest: Bibasilar atelectasis or consolidation, similar to prior examination. Trace bilateral pleural effusions. Hepatobiliary: No solid liver abnormality is seen. No gallstones, gallbladder wall thickening, or biliary dilatation. Pancreas: Unremarkable. No pancreatic ductal dilatation or surrounding inflammatory changes. Spleen: Normal in size without significant abnormality. Adrenals/Urinary Tract: Adrenal glands are unremarkable. Kidneys are normal, without renal calculi, solid lesion, or hydronephrosis. Bladder is unremarkable. Stomach/Bowel: Stomach is within normal limits. Appendix appears normal. Thickened, inflamed appearance of the distal colon and rectum is improved compared to prior examination. Vascular/Lymphatic: No significant vascular findings are present. No enlarged abdominal or pelvic lymph nodes. Reproductive: No mass or other significant abnormality. Other: No abdominal wall hernia or abnormality. There are two segments of intra-abdominal shunt catheter tubing, one tip positioned in the lower right hemiabdomen (series 3, image 70) and one positioned in the upper right hemiabdomen anterior to the cecum (series 3, image 41). Of note, the superior catheter tip is mobile when compared to prior examination, and was previously seen to rest within a fluid collection, which has since resolved or redistributed. There remains a small volume of ascites about the abdomen and pelvis. Musculoskeletal: No acute or significant osseous findings. IMPRESSION: 1. There are two segments of intra-abdominal shunt catheter tubing, one tip positioned in the lower right hemiabdomen (series 3, image 70) and one positioned in the upper right hemiabdomen anterior to the cecum (series  3, image 41). Of note, the superior catheter tip is mobile when compared to prior examination, and was previously seen to rest within a fluid collection, which has since resolved or redistributed. There remains  a small volume of ascites about the abdomen and pelvis. 2. Bibasilar atelectasis or consolidation, similar to prior examination. Trace bilateral pleural effusions. Further concerning for infection or aspiration. 3. Thickened, inflamed appearance of the distal colon and rectum is improved compared to prior examination, consistent with resolving infectious or inflammatory colitis. Electronically Signed   By: Eddie Candle M.D.   On: 11/03/2019 17:02        Scheduled Meds: . Chlorhexidine Gluconate Cloth  6 each Topical Daily  . dextromethorphan-guaiFENesin  2 tablet Oral BID  . dicyclomine  10 mg Oral TID AC & HS  . ferrous sulfate  325 mg Oral Q breakfast  . metoprolol tartrate  12.5 mg Oral TID  . pantoprazole sodium  40 mg Oral Daily  . phenobarbital  97.2 mg Oral QHS  . sucralfate  1 g Oral TID WC & HS   Continuous Infusions: . ceFEPime (MAXIPIME) IV 2 g (11/04/19 1253)  . dextrose 5 % and 0.9% NaCl 50 mL/hr at 11/04/19 0009  . heparin 950 Units/hr (11/04/19 0952)  . metronidazole 500 mg (11/04/19 0957)  . vancomycin       LOS: 21 days   Total time spent for this encounter is 25 minutes     Elie Confer, MD [facility] [phone] If 7PM-7AM, please contact night-coverage  11/04/2019, 4:05 PM

## 2019-11-05 DIAGNOSIS — R4182 Altered mental status, unspecified: Secondary | ICD-10-CM

## 2019-11-05 DIAGNOSIS — R0902 Hypoxemia: Secondary | ICD-10-CM

## 2019-11-05 LAB — CBC
HCT: 29 % — ABNORMAL LOW (ref 36.0–46.0)
Hemoglobin: 10.1 g/dL — ABNORMAL LOW (ref 12.0–15.0)
MCH: 28.2 pg (ref 26.0–34.0)
MCHC: 34.8 g/dL (ref 30.0–36.0)
MCV: 81 fL (ref 80.0–100.0)
Platelets: 317 10*3/uL (ref 150–400)
RBC: 3.58 MIL/uL — ABNORMAL LOW (ref 3.87–5.11)
RDW: 15 % (ref 11.5–15.5)
WBC: 12.2 10*3/uL — ABNORMAL HIGH (ref 4.0–10.5)
nRBC: 0 % (ref 0.0–0.2)

## 2019-11-05 LAB — HEPARIN LEVEL (UNFRACTIONATED)
Heparin Unfractionated: <0.1 IU/mL — ABNORMAL LOW (ref 0.30–0.70)
Heparin Unfractionated: 0.69 IU/mL (ref 0.30–0.70)

## 2019-11-05 LAB — MAGNESIUM: Magnesium: 1.6 mg/dL — ABNORMAL LOW (ref 1.7–2.4)

## 2019-11-05 MED ORDER — ACETAMINOPHEN 325 MG PO TABS
650.0000 mg | ORAL_TABLET | ORAL | Status: DC | PRN
  Administered 2019-11-05 – 2019-11-11 (×17): 650 mg via ORAL
  Filled 2019-11-05 (×16): qty 2

## 2019-11-05 MED ORDER — SODIUM CHLORIDE 0.9% FLUSH: 10.0000 mL | INTRAVENOUS | Status: DC | PRN

## 2019-11-05 MED ORDER — ACETAMINOPHEN 650 MG RE SUPP: 650.0000 mg | RECTAL | Status: DC | PRN

## 2019-11-05 MED ORDER — SODIUM CHLORIDE 0.9% FLUSH
10.0000 mL | Freq: Two times a day (BID) | INTRAVENOUS | Status: DC
  Administered 2019-11-05 – 2019-11-06 (×3): 10 mL
  Administered 2019-11-06 – 2019-11-07 (×2): 20 mL
  Administered 2019-11-08 – 2019-11-10 (×2): 10 mL
  Administered 2019-11-11 (×2): 20 mL

## 2019-11-05 NOTE — Progress Notes (Signed)
ANTICOAGULATION CONSULT NOTE  Pharmacy Consult: Heparin Indication: DVT   No Active Allergies  Patient Measurements: Height: 5\' 4"  (162.6 cm) Weight: 179 lb 3.7 oz (81.3 kg) IBW/kg (Calculated) : 54.7 Heparin Dosing Weight: 70kg  Vital Signs: Temp: 98.8 F (37.1 C) (11/08 2030) Temp Source: Oral (11/08 2030) BP: 146/98 (11/09 0021) Pulse Rate: 94 (11/08 2030)  Labs: Recent Labs    11/02/19 0630  11/03/19 0247 11/04/19 0602 11/04/19 0807 11/04/19 1658 11/05/19 0200  HGB 9.9*  --  10.0* 10.4*  --   --  10.1*  HCT 27.4*  --  28.5* 29.9*  --   --  29.0*  PLT 345  --  335 354  --   --  317  HEPARINUNFRC  --    < > 0.57  --  0.81* <0.10* <0.10*  CREATININE 0.55  --  0.52  --   --   --   --    < > = values in this interval not displayed.    Estimated Creatinine Clearance: 103.1 mL/min (by C-G formula based on SCr of 0.52 mg/dL).   Assessment: 33 y.o. female with recent DVT, Eliquis on hold, for heparin  Goal of Therapy:  Heparin level 0.3-0.7 units/ml Monitor platelets by anticoagulation protocol: Yes    Plan:  Increase Heparin 1250 units/hr Check heparin level in 8 hours.   Phillis Knack, PharmD, BCPS  11/05/2019 4:05 AM

## 2019-11-05 NOTE — Progress Notes (Signed)
PROGRESS NOTE    Tracey Graham  [id number]:[id number] DOB: [birth year] DOA: 10/13/2019 PCP: Clinic, General Medical   Brief Narrative:  33 year old with history of cerebral palsy, wheelchair-bound/fully dependent on ADL S, VP shunt for hydrocephalus, seizure disorder, intellectual disability came to the ER with complaints of fever abdominal pain.  Initial SIRS, COVID-19-negative.  CT of the abdomen pelvis was overall negative.  CT head-negative.  Work-up showed left lower extremity acute DVT started on anticoagulation.  LP initially performed 10/21 which showed pleocytosis, elevated protein and lymphocytic predominance without any bacteria.  Initial thoughts were DVT causing her fevers.  Aseptic meningitis-treated with 3 days of Decadron.  Course further complicated by abdominal pain with GI consultation.  EGD on 10/29 showed esophagitis and gastric ulcers for which GI recommended PPI twice daily.  No biopsies were taken as patient was on Eliquis.  Repeat CT of the abdomen pelvis with contrast showed loculated fluid collection at the tip of the VP shunt raising concerns for VP shunt infection.  Neurosurgery consulted, repeat LP performed on 10/30/2019.   Patient is currently on antibiotics, Vanco, cefepime and metronidazole  Repeat CT abdomen and pelvis with contrast done on 11/03/2019 showed identification of intra-abdominal shunt with 1 position in the lower right hemiabdomen and second portion in the upper right hemiabdomen anterior to cecum.  The fluid collection around the upper hemiabdominal catheter has resolved.  Small volume ascites in the abdomen and pelvis.  Thickened, inflamed appearance of the distal colon and rectum which has improved consistent with resolving infection inflammatory colitis.  Assessment & Plan:   Principal Problem:   Infection of VP (ventriculoperitoneal) shunt (HCC) Active Problems:   Hydrocephalus (HCC)   VP (ventriculoperitoneal) shunt status   Encephalopathy  Abdominal pain   Nuchal rigidity   Aseptic meningitis   Constipation   Deep vein thrombosis (DVT) of femoral vein of left lower extremity (HCC)   AMS (altered mental status)  Sepsis of unclear etiology, could be multiple reasons. Recurrent fevers, unknown etiology Suspicion for multifocal pneumonia versus VP shunt infection nfection possibly infected shunt..  Cultures remain negative, GI panel negative.  CT of the abdomen pelvis with contrast on 10/30 showed loculated fluid around tip of VP shunt in the left anterior pelvis.  Neurosurgery following. -Initial LP 10/17/2019-unrevealing.                                                         . -Repeat CT of the abdomen pelvis 11/03/2019 showed improvement in fluid collection and infectious/inflammatory colitis likely from antibiotic therapy. Neurosurgery on board  Acute hypoxic respiratory failure secondary to multifocal pneumonia -Chest x-ray showed bilateral pulmonary infiltrates at the bases. -Currently on IV vancomycin, cefepime, Flagyl.  Breathing has remained stable and not on O2. -Infectious disease team following. -Aggressive bronchodilators.  Incentive spirometer and flutter valve if she uses it.  Abdominal discomfort, nonspecific -Diminished bowel sounds.  Possible could be developing ileus. -Abdominal x-ray showed ileus versus possible small bowel obstruction.  Esophagitis and gastric ulcers could also be playing a role -Repeat CT abdomen and pelvis on 11/03/2019 shows significant improvement in fluid collection as well as inflammation in the distal colon and rectum -Mother would like to avoid morphine and feels that Tylenol works better but would like to slightly increase the frequency with which patient can receive  Tylenol  Persistent sinus tachycardia with uncontrolled elevated blood pressure.   Possibly inappropriate sinus tachycardia versus ongoing infection. May consider ivabradine if infection is ruled out and heart rate is  not controlled beta-blocker or calcium channel blocker.  Esophagitis and nonbleeding gastric ulcers Continue with Protonix twice daily Continue with Carafate 4 times daily.   Acute left lower extremity DVT -Continue anticoagulation.  Iron deficiency anemia -Iron supplements.  Bowel regimen.  Dysphagia -Aspiration precaution  Seizure disorder Continue with antiepileptic drugs .  DVT prophylaxis: Heparin drip Code Status: Full code Family Communication: Mother at bedside Disposition Plan: Pending clinical improvement   Consultants:   Neurosurgery  Infectious disease   Subjective: Mother at bedside states that the patient overall is improving albeit very slowly.  She would like to request the use of Tylenol more frequently instead of morphine for the patient's abdominal pain as she feels like that suits her better.  Patient not able to verbalize anything meaningful as it pertains to her care.  Objective: Vitals:   11/05/19 0459 11/05/19 1157 11/05/19 1231 11/05/19 1507  BP: (!) 139/116 (!) 144/93 (!) 144/89 (!) 145/97  Pulse:  (!) 103 (!) 108 88  Resp: 18  19 16   Temp: 97.8 F (36.6 C)  97.7 F (36.5 C) 98.9 F (37.2 C)  TempSrc: Axillary  Oral Tympanic  SpO2:   97% 97%  Weight:      Height:        Intake/Output Summary (Last 24 hours) at 11/05/2019 1551 Last data filed at 11/05/2019 1509 Gross per 24 hour  Intake 1180 ml  Output 1800 ml  Net -620 ml   Filed Weights   10/13/19 2006 10/14/19 0237 10/31/19 2122  Weight: 74.4 kg 74.7 kg 81.3 kg    Examination:  General exam: Slight distress due to abdominal discomfort Respiratory system: Diminished at bases Cardiovascular system: S1 & S2 heard, RRR. No JVD, murmurs, rubs, gallops or clicks. No pedal edema. Gastrointestinal system: Abdomen mildly distended.  Diminished bowel sounds. Central nervous system: Alert to her name only which is at baseline Extremities: Contracted upper extremities Skin: No  rashes, lesions or ulcers Psychiatry: Poor judgment and insight  External urinary catheter Right neck triple-lumen catheter 10/27/2019.    LOS: 22 days   Total time spent for this encounter is 25 minutes    10/29/2019, MD [facility] If 7PM-7AM, please contact night-coverage  11/05/2019, 3:51 PM

## 2019-11-05 NOTE — Progress Notes (Signed)
Neurosurgery Service Progress Note  Subjective: No acute events overnight, mental status and abdominal pain improving  Objective: Vitals:   11/04/19 1617 11/04/19 2030 11/05/19 0021 11/05/19 0459  BP: (!) 133/93 (!) 132/97 (!) 146/98 (!) 139/116  Pulse: (!) 113 94    Resp: 17 20 15 18   Temp: 99 F (37.2 C) 98.8 F (37.1 C)  97.8 F (36.6 C)  TempSrc: Axillary Oral  Axillary  SpO2: 98% 98% 96%   Weight:      Height:       Temp (24hrs), Avg:98.7 F (37.1 C), Min:97.8 F (36.6 C), Max:99 F (37.2 C)  CBC Latest Ref Rng & Units 11/05/2019 11/04/2019 11/03/2019  WBC 4.0 - 10.5 K/uL 12.2(H) 14.0(H) 13.6(H)  Hemoglobin 12.0 - 15.0 g/dL 10.1(L) 10.4(L) 10.0(L)  Hematocrit 36.0 - 46.0 % 29.0(L) 29.9(L) 28.5(L)  Platelets 150 - 400 K/uL 317 354 335   BMP Latest Ref Rng & Units 11/03/2019 11/02/2019 11/01/2019  Glucose 70 - 99 mg/dL 141(H) 118(H) 107(H)  BUN 6 - 20 mg/dL <5(L) <5(L) <5(L)  Creatinine 0.44 - 1.00 mg/dL 0.52 0.55 0.55  Sodium 135 - 145 mmol/L 139 139 138  Potassium 3.5 - 5.1 mmol/L 3.2(L) 3.3(L) 3.6  Chloride 98 - 111 mmol/L 107 108 107  CO2 22 - 32 mmol/L 24 24 24   Calcium 8.9 - 10.3 mg/dL 8.8(L) 8.4(L) 8.4(L)    Intake/Output Summary (Last 24 hours) at 11/05/2019 0714 Last data filed at 11/05/2019 0500 Gross per 24 hour  Intake 1180 ml  Output 1001 ml  Net 179 ml    Current Facility-Administered Medications:  .  acetaminophen (TYLENOL) suppository 650 mg, 650 mg, Rectal, Q8H PRN, Blount, Xenia T, NP, 650 mg at 10/28/19 0315 .  acetaminophen (TYLENOL) tablet 650 mg, 650 mg, Oral, Q6H PRN, Norins, Heinz Knuckles, MD, 650 mg at 11/04/19 1408 .  bisacodyl (DULCOLAX) suppository 10 mg, 10 mg, Rectal, Daily PRN, [name] Pap N, DO, 10 mg at 10/23/19 0418 .  ceFEPIme (MAXIPIME) 2 g in sodium chloride 0.9 % 100 mL IVPB, 2 g, Intravenous, Q8H, Dang, Thuy D, RPH, Last Rate: 200 mL/hr at 11/05/19 0436, 2 g at 11/05/19 0436 .  Chlorhexidine Gluconate Cloth 2 % PADS 6 each, 6 each,  Topical, Daily, Kayleen Memos, DO, 6 each at 11/04/19 [phone]  dextromethorphan-guaiFENesin (MUCINEX DM) 30-600 MG per 12 hr tablet 2 tablet, 2 tablet, Oral, BID, Hall, Carole N, DO, 2 tablet at 11/03/19 1200 .  dextrose 5 %-0.9 % sodium chloride infusion, , Intravenous, Continuous, Kayleen Memos, DO, Last Rate: 50 mL/hr at 11/05/19 0449 .  dicyclomine (BENTYL) capsule 10 mg, 10 mg, Oral, TID AC & HS, Vena Rua, PA-C, 10 mg at 11/05/19 0535 .  diphenhydrAMINE (BENADRYL) capsule 25 mg, 25 mg, Oral, Q6H PRN, Hall, Carole N, DO, 25 mg at 10/27/19 1200 .  ferrous sulfate tablet 325 mg, 325 mg, Oral, Q breakfast, [name] Pap N, DO, 325 mg at 11/01/19 7253 .  heparin ADULT infusion 100 units/mL (25000 units/213mL sodium chloride 0.45%), 1,250 Units/hr, Intravenous, Continuous, Ajuonuma, Waneta Martins, MD, Last Rate: 12.5 mL/hr at 11/05/19 0530, 1,250 Units/hr at 11/05/19 0530 .  labetalol (NORMODYNE) injection 5 mg, 5 mg, Intravenous, Q2H PRN, [name] Pap N, DO, 5 mg at 10/29/19 2309 .  levalbuterol (XOPENEX HFA) inhaler 1 puff, 1 puff, Inhalation, Q8H PRN, Hall, Carole N, DO .  lidocaine (XYLOCAINE) 1 % (with pres) injection, , Infiltration, PRN, Bruning, Kevin, PA-C, 5 mL at 10/25/19  1236 .  metoprolol tartrate (LOPRESSOR) tablet 12.5 mg, 12.5 mg, Oral, TID, Hall, Carole N, DO, 12.5 mg at 11/04/19 2107 .  metroNIDAZOLE (FLAGYL) IVPB 500 mg, 500 mg, Intravenous, Q8H, Cliffton Asters, MD, Last Rate: 100 mL/hr at 11/05/19 0215, 500 mg at 11/05/19 0215 .  morphine 2 MG/ML injection 1 mg, 1 mg, Intravenous, Q4H PRN, Dow Adolph N, DO, 1 mg at 11/05/19 0457 .  ondansetron (ZOFRAN) tablet 4 mg, 4 mg, Oral, Q6H PRN **OR** ondansetron (ZOFRAN) injection 4 mg, 4 mg, Intravenous, Q6H PRN, Charlsie Quest, MD, 4 mg at 11/04/19 1656 .  pantoprazole sodium (PROTONIX) 40 mg/20 mL oral suspension 40 mg, 40 mg, Oral, Daily, Ajuonuma, Verdie Mosher, MD, 40 mg at 11/02/19 1243 .  PHENobarbital (LUMINAL) tablet 97.2 mg, 97.2  mg, Oral, QHS, Levie Heritage, DO, 97.2 mg at 11/04/19 2107 .  polyethylene glycol (MIRALAX / GLYCOLAX) packet 17 g, 17 g, Oral, Daily PRN, Hall, Carole N, DO .  senna-docusate (Senokot-S) tablet 2 tablet, 2 tablet, Oral, QHS PRN, Amin, Ankit Chirag, MD .  sodium chloride flush (NS) 0.9 % injection 10-40 mL, 10-40 mL, Intracatheter, PRN, Amin, Ankit Chirag, MD .  sucralfate (CARAFATE) 1 GM/10ML suspension 1 g, 1 g, Oral, TID WC & HS, Hall, Carole N, DO, 1 g at 11/04/19 2107 .  vancomycin (VANCOCIN) IVPB 750 mg/150 ml premix, 750 mg, Intravenous, Q12H, Stevphen Rochester, RPH, Last Rate: 150 mL/hr at 11/05/19 0533, 750 mg at 11/05/19 0533   Physical Exam: Awake/alert, much more interactive today and smiling, abdomen still distended and tender  Assessment & Plan: 33 y.o. woman w/ h/o MRCP, shunted hydrocephalus, admitted for abdominal pain and fevers, prolonged course w/ LP w/ pleocytosis w/o positive culture, DVT, then RLL PNA. Initial CT A/P showed normal trace fluid 2/2 CSF drainage, but repeat CT A/P showed possible catheter tip pseudocyst with some reactive changes. Last VPS revision was 20+ years ago. Proximal catheter in interhemispheric space. 11/4 rpt LP w/ no OP, CSF shows 61W 1R TP80 G64, 92% lymphs, rpt CT A/P 11/7 shows resolution of abdominal catheter pseudocyst, resolution of colitis adjacent to shunt catheter  -thankfully, looks like the distal infection is responding to antibiotics. Clincally, her shunt continues to work or she is not shunt-dependent. I recommend continuing ABx, no surgical intervention indicated at this time. Defer to ID regarding total length of therapy of antibiotics, but would encourage at least another week   Jadene Pierini  11/05/19 7:14 AM

## 2019-11-05 NOTE — Progress Notes (Signed)
ANTICOAGULATION CONSULT NOTE - Follow-Up  Pharmacy Consult: Heparin Indication: DVT   No Active Allergies  Patient Measurements: Height: 5\' 4"  (162.6 cm) Weight: 179 lb 3.7 oz (81.3 kg) IBW/kg (Calculated) : 54.7 Heparin Dosing Weight: 70kg  Vital Signs: Temp: 98.9 F (37.2 C) (11/09 1507) Temp Source: Tympanic (11/09 1507) BP: 145/97 (11/09 1507) Pulse Rate: 88 (11/09 1507)  Labs: Recent Labs    11/03/19 0247 11/04/19 0602  11/04/19 1658 11/05/19 0200 11/05/19 1545  HGB 10.0* 10.4*  --   --  10.1*  --   HCT 28.5* 29.9*  --   --  29.0*  --   PLT 335 354  --   --  317  --   HEPARINUNFRC 0.57  --    < > <0.10* <0.10* 0.69  CREATININE 0.52  --   --   --   --   --    < > = values in this interval not displayed.    Estimated Creatinine Clearance: 103.1 mL/min (by C-G formula based on SCr of 0.52 mg/dL).   Assessment: 54 YOF with acute DVT, started on lovenox and transitioned to Eliquis. Now with possible VP shunt infection and transitioned to heparin for procedures. Last dose of Eliquis administered 10/31 at 2250.  Heparin level now therapeutic at 0.69 after being undetectable x2 which is consistent with it previously being within higher end of normal range/supratherapeutic. Heparin infusing peripherally with lab drawn centrally.   Goal of Therapy:  Heparin level 0.3-0.7 units/ml Monitor platelets by anticoagulation protocol: Yes    Plan:  -Reduce heparin to 1150 units/hr -Recheck heparin level with morning labs   Arrie Senate, PharmD, BCPS Clinical Pharmacist [phone] Please check AMION for all [place] numbers 11/05/2019

## 2019-11-06 DIAGNOSIS — R1013 Epigastric pain: Secondary | ICD-10-CM

## 2019-11-06 DIAGNOSIS — K529 Noninfective gastroenteritis and colitis, unspecified: Secondary | ICD-10-CM

## 2019-11-06 DIAGNOSIS — R140 Abdominal distension (gaseous): Secondary | ICD-10-CM

## 2019-11-06 LAB — CBC
HCT: 28 % — ABNORMAL LOW (ref 36.0–46.0)
Hemoglobin: 9.9 g/dL — ABNORMAL LOW (ref 12.0–15.0)
MCH: 28.7 pg (ref 26.0–34.0)
MCHC: 35.4 g/dL (ref 30.0–36.0)
MCV: 81.2 fL (ref 80.0–100.0)
Platelets: 313 10*3/uL (ref 150–400)
RBC: 3.45 MIL/uL — ABNORMAL LOW (ref 3.87–5.11)
RDW: 15.2 % (ref 11.5–15.5)
WBC: 12.2 10*3/uL — ABNORMAL HIGH (ref 4.0–10.5)
nRBC: 0 % (ref 0.0–0.2)

## 2019-11-06 LAB — BASIC METABOLIC PANEL
Anion gap: 8 (ref 5–15)
BUN: <5 mg/dL — ABNORMAL LOW (ref 6–20)
CO2: 25 mmol/L (ref 22–32)
Calcium: 8.6 mg/dL — ABNORMAL LOW (ref 8.9–10.3)
Chloride: 107 mmol/L (ref 98–111)
Creatinine, Ser: 0.45 mg/dL (ref 0.44–1.00)
GFR calc Af Amer: >60 mL/min (ref >60)
GFR calc non Af Amer: >60 mL/min (ref >60)
Glucose, Bld: 101 mg/dL — ABNORMAL HIGH (ref 70–99)
Potassium: 3.2 mmol/L — ABNORMAL LOW (ref 3.5–5.1)
Sodium: 140 mmol/L (ref 135–145)

## 2019-11-06 LAB — HEPARIN LEVEL (UNFRACTIONATED)
Heparin Unfractionated: 1.54 IU/mL — ABNORMAL HIGH (ref 0.30–0.70)
Heparin Unfractionated: 1.26 IU/mL — ABNORMAL HIGH (ref 0.30–0.70)

## 2019-11-06 MED ORDER — POTASSIUM CHLORIDE 10 MEQ/100ML IV SOLN
10.0000 meq | INTRAVENOUS | Status: AC
  Administered 2019-11-06 (×4): 10 meq via INTRAVENOUS
  Filled 2019-11-06: qty 100

## 2019-11-06 MED ORDER — PROCHLORPERAZINE EDISYLATE 10 MG/2ML IJ SOLN
5.0000 mg | Freq: Once | INTRAMUSCULAR | Status: AC
  Administered 2019-11-06: 5 mg via INTRAVENOUS
  Filled 2019-11-06: qty 1

## 2019-11-06 MED ORDER — POTASSIUM CHLORIDE 20 MEQ PO PACK
40.0000 meq | PACK | Freq: Once | ORAL | Status: AC
  Administered 2019-11-06: 40 meq via ORAL
  Filled 2019-11-06: qty 2

## 2019-11-06 NOTE — Progress Notes (Signed)
Neurosurgery Service Progress Note  Subjective: No acute events overnight, some abdominal pain overnight, hasn't tried breakfast yet, no N/V  Objective: Vitals:   11/05/19 2127 11/06/19 0044 11/06/19 0401 11/06/19 0445  BP:  117/86 (!) 144/102 (!) 148/101  Pulse: (!) 104 95 96 99  Resp:  19  16  Temp:  98.7 F (37.1 C) 98.6 F (37 C)   TempSrc:  Axillary Axillary   SpO2:  97% 97% 97%  Weight:      Height:       Temp (24hrs), Avg:98.6 F (37 C), Min:97.7 F (36.5 C), Max:99 F (37.2 C)  CBC Latest Ref Rng & Units 11/06/2019 11/05/2019 11/04/2019  WBC 4.0 - 10.5 K/uL 12.2(H) 12.2(H) 14.0(H)  Hemoglobin 12.0 - 15.0 g/dL 9.9(L) 10.1(L) 10.4(L)  Hematocrit 36.0 - 46.0 % 28.0(L) 29.0(L) 29.9(L)  Platelets 150 - 400 K/uL 313 317 354   BMP Latest Ref Rng & Units 11/06/2019 11/03/2019 11/02/2019  Glucose 70 - 99 mg/dL 101(H) 141(H) 118(H)  BUN 6 - 20 mg/dL <5(L) <5(L) <5(L)  Creatinine 0.44 - 1.00 mg/dL 0.45 0.52 0.55  Sodium 135 - 145 mmol/L 140 139 139  Potassium 3.5 - 5.1 mmol/L 3.2(L) 3.2(L) 3.3(L)  Chloride 98 - 111 mmol/L 107 107 108  CO2 22 - 32 mmol/L 25 24 24   Calcium 8.9 - 10.3 mg/dL 8.6(L) 8.8(L) 8.4(L)    Intake/Output Summary (Last 24 hours) at 11/06/2019 0727 Last data filed at 11/06/2019 0715 Gross per 24 hour  Intake 2798.45 ml  Output 1550 ml  Net 1248.45 ml    Current Facility-Administered Medications:  .  acetaminophen (TYLENOL) suppository 650 mg, 650 mg, Rectal, Q4H PRN, Theresa Duty, Anurag, MD .  acetaminophen (TYLENOL) tablet 650 mg, 650 mg, Oral, Q4H PRN, Theresa Duty, Anurag, MD, 650 mg at 11/06/19 0454 .  bisacodyl (DULCOLAX) suppository 10 mg, 10 mg, Rectal, Daily PRN, [name] Pap N, DO, 10 mg at 10/23/19 0418 .  ceFEPIme (MAXIPIME) 2 g in sodium chloride 0.9 % 100 mL IVPB, 2 g, Intravenous, Q8H, Dang, Thuy D, RPH, Last Rate: 200 mL/hr at 11/06/19 0649, 2 g at 11/06/19 0649 .  Chlorhexidine Gluconate Cloth 2 % PADS 6 each, 6 each, Topical, Daily, Kayleen Memos, DO, 6 each at 11/05/19 1238 .  dextromethorphan-guaiFENesin (MUCINEX DM) 30-600 MG per 12 hr tablet 2 tablet, 2 tablet, Oral, BID, Hall, Carole N, DO, 2 tablet at 11/03/19 1200 .  dextrose 5 %-0.9 % sodium chloride infusion, , Intravenous, Continuous, Kayleen Memos, DO, Last Rate: 50 mL/hr at 11/06/19 6759 .  dicyclomine (BENTYL) capsule 10 mg, 10 mg, Oral, TID AC & HS, Vena Rua, PA-C, 10 mg at 11/06/19 0522 .  diphenhydrAMINE (BENADRYL) capsule 25 mg, 25 mg, Oral, Q6H PRN, Hall, Carole N, DO, 25 mg at 10/27/19 1200 .  ferrous sulfate tablet 325 mg, 325 mg, Oral, Q breakfast, [name] Pap N, DO, 325 mg at 11/01/19 1638 .  heparin ADULT infusion 100 units/mL (25000 units/258mL sodium chloride 0.45%), 900 Units/hr, Intravenous, Continuous, Satsangi, Anurag, MD, Last Rate: 9 mL/hr at 11/06/19 0715, 900 Units/hr at 11/06/19 0715 .  labetalol (NORMODYNE) injection 5 mg, 5 mg, Intravenous, Q2H PRN, [name] Pap N, DO, 5 mg at 10/29/19 2309 .  levalbuterol (XOPENEX HFA) inhaler 1 puff, 1 puff, Inhalation, Q8H PRN, Hall, Carole N, DO .  lidocaine (XYLOCAINE) 1 % (with pres) injection, , Infiltration, PRN, Bruning, Kevin, PA-C, 5 mL at 10/25/19 1236 .  metoprolol tartrate (LOPRESSOR) tablet 12.5 mg,  12.5 mg, Oral, TID, Darlin Drop, DO, 12.5 mg at 11/05/19 2128 .  metroNIDAZOLE (FLAGYL) IVPB 500 mg, 500 mg, Intravenous, Q8H, Cliffton Asters, MD, Last Rate: 100 mL/hr at 11/06/19 0222, 500 mg at 11/06/19 0222 .  morphine 2 MG/ML injection 1 mg, 1 mg, Intravenous, Q4H PRN, Dow Adolph N, DO, 1 mg at 11/06/19 0453 .  ondansetron (ZOFRAN) tablet 4 mg, 4 mg, Oral, Q6H PRN **OR** ondansetron (ZOFRAN) injection 4 mg, 4 mg, Intravenous, Q6H PRN, Charlsie Quest, MD, 4 mg at 11/05/19 0751 .  pantoprazole sodium (PROTONIX) 40 mg/20 mL oral suspension 40 mg, 40 mg, Oral, Daily, Ajuonuma, Verdie Mosher, MD, 40 mg at 11/05/19 1159 .  PHENobarbital (LUMINAL) tablet 97.2 mg, 97.2 mg, Oral, QHS, Levie Heritage, DO, 97.2 mg at 11/05/19 2127 .  polyethylene glycol (MIRALAX / GLYCOLAX) packet 17 g, 17 g, Oral, Daily PRN, Hall, Carole N, DO .  senna-docusate (Senokot-S) tablet 2 tablet, 2 tablet, Oral, QHS PRN, Dimple Nanas, MD, 2 tablet at 11/05/19 2128 .  sodium chloride flush (NS) 0.9 % injection 10-40 mL, 10-40 mL, Intracatheter, PRN, Amin, Ankit Chirag, MD .  sodium chloride flush (NS) 0.9 % injection 10-40 mL, 10-40 mL, Intracatheter, Q12H, Moriyah Byington, Clovis Pu, MD, 10 mL at 11/05/19 2130 .  sodium chloride flush (NS) 0.9 % injection 10-40 mL, 10-40 mL, Intracatheter, PRN, Dorean Hiebert A, MD .  sucralfate (CARAFATE) 1 GM/10ML suspension 1 g, 1 g, Oral, TID WC & HS, Hall, Carole N, DO, 1 g at 11/05/19 2129 .  vancomycin (VANCOCIN) IVPB 750 mg/150 ml premix, 750 mg, Intravenous, Q12H, Stevphen Rochester, RPH, Last Rate: 150 mL/hr at 11/06/19 0431, 750 mg at 11/06/19 0431   Physical Exam: Awake/alert/interactive, abdomen distended & tender  Assessment & Plan: 33 y.o. woman w/ h/o MRCP, shunted hydrocephalus, admitted for abdominal pain and fevers, prolonged course w/ LP w/ pleocytosis w/o positive culture, DVT, then RLL PNA. Initial CT A/P showed normal trace fluid 2/2 CSF drainage, but repeat CT A/P showed possible catheter tip pseudocyst with some reactive changes. Last VPS revision was 20+ years ago. Proximal catheter in interhemispheric space. 11/4 rpt LP w/ no OP, CSF shows 61W 1R TP80 G64, 92% lymphs, rpt CT A/P 11/7 shows resolution of abdominal catheter pseudocyst, resolution of colitis adjacent to shunt catheter  -no change in neurosurgical plan of care, defer to ID regarding total length of therapy of antibiotics, but would encourage at least another week after the CT  Tracey Graham  11/06/19 7:27 AM

## 2019-11-06 NOTE — Progress Notes (Addendum)
ANTICOAGULATION CONSULT NOTE - Follow-Up  Pharmacy Consult: Heparin Indication: DVT   No Active Allergies  Patient Measurements: Height: 5\' 4"  (162.6 cm) Weight: 179 lb 3.7 oz (81.3 kg) IBW/kg (Calculated) : 54.7 Heparin Dosing Weight: 70 kg  Vital Signs: BP: 139/107 (11/10 1539) Pulse Rate: 90 (11/10 1539)  Labs: Recent Labs    11/04/19 0602  11/05/19 0200 11/05/19 1545 11/06/19 0453 11/06/19 0454 11/06/19 1417  HGB 10.4*  --  10.1*  --  9.9*  --   --   HCT 29.9*  --  29.0*  --  28.0*  --   --   PLT 354  --  317  --  313  --   --   HEPARINUNFRC  --    < > <0.10* 0.69  --  1.54* 1.26*  CREATININE  --   --   --   --  0.45  --   --    < > = values in this interval not displayed.    Estimated Creatinine Clearance: 103.1 mL/min (by C-G formula based on SCr of 0.45 mg/dL).   Assessment: 33 yr old female with acute DVT, started on Lovenox and transitioned to Eliquis. Now with possible VP shunt infection and transitioned to heparin for procedures. Last dose of Eliquis administered 10/31 at 2250.  Heparin level earlier today was 1.54 units/ml, which is above the goal range for this pt. Heparin infusion was held for 45 minutes and the infusion was reduced to 900 units/hr. Heparin level drawn ~6 hrs after being held and infusion reduction to 900 units/hr was 1.26 units/ml, which remains above the goal range for this pt. Per RN, no issue with IV or bleeding observed.   Heparin infusing peripherally with lab drawn centrally.   Goal of Therapy:  Heparin level 0.3-0.7 units/ml Monitor platelets by anticoagulation protocol: Yes    Plan:  Hold heparin infusion for 45 mins, then restart at 700 units/hr Check 6-hr heparin level Monitor heparin level, CBC daily Monitor for signs/symptoms of bleeding   Gillermina Hu, PharmD, BCPS, [facility] Clinical Pharmacist 11/06/2019

## 2019-11-06 NOTE — Progress Notes (Signed)
Care plan reviewed, Pt is progressing. Abdominal pain tolerated well with Morphine and Tylenol as needed.  Vital sings stable, remined afebrile. Her mother always at bedside. HR 90s, normal sinus rhythm on monitor. Room air SPO2 97%. No acute distress noted tonight.   Central line dressing changed, caps and tubings changed. No complications.   Pharmacist called to notified Heparin level 1.54. requested to stop Heparin temporarily. Will restarted at 7 am and recuse rated to 900 units / hr. Will continue to monitor.  Contrell Ballentine PZWCHE,NI

## 2019-11-06 NOTE — Progress Notes (Signed)
ANTICOAGULATION CONSULT NOTE  Pharmacy Consult: Heparin Indication: DVT   No Active Allergies  Patient Measurements: Height: 5\' 4"  (162.6 cm) Weight: 179 lb 3.7 oz (81.3 kg) IBW/kg (Calculated) : 54.7 Heparin Dosing Weight: 70kg  Vital Signs: Temp: 98.6 F (37 C) (11/10 0401) Temp Source: Axillary (11/10 0401) BP: 148/101 (11/10 0445) Pulse Rate: 99 (11/10 0445)  Labs: Recent Labs    11/04/19 0602  11/05/19 0200 11/05/19 1545 11/06/19 0453 11/06/19 0454  HGB 10.4*  --  10.1*  --  9.9*  --   HCT 29.9*  --  29.0*  --  28.0*  --   PLT 354  --  317  --  313  --   HEPARINUNFRC  --    < > <0.10* 0.69  --  1.54*  CREATININE  --   --   --   --  0.45  --    < > = values in this interval not displayed.    Estimated Creatinine Clearance: 103.1 mL/min (by C-G formula based on SCr of 0.45 mg/dL).   Assessment: 33 y.o. female with recent DVT, Eliquis on hold, for heparin  Goal of Therapy:  Heparin level 0.3-0.7 units/ml Monitor platelets by anticoagulation protocol: Yes    Plan:  Hold heparin x 45 min, then decrease heparin 900 units/hr Check heparin level in 8 hours.   Phillis Knack, PharmD, BCPS  11/06/2019 6:08 AM

## 2019-11-06 NOTE — Plan of Care (Signed)
Poc progressing.  

## 2019-11-06 NOTE — Progress Notes (Signed)
PROGRESS NOTE    Tracey Graham  FMB:[id number] DOB: 13-Jul-1986 DOA: 10/13/2019 PCP: Clinic, General Medical   Brief Narrative:  33 year old with history of cerebral palsy, wheelchair-bound/fully dependent on ADL S, VP shunt for hydrocephalus, seizure disorder, intellectual disability came to the ER with complaints of fever abdominal pain.  Initial SIRS, COVID-19-negative.  CT of the abdomen pelvis was overall negative.  CT head-negative.  Work-up showed left lower extremity acute DVT started on anticoagulation.  LP initially performed 10/21 which showed pleocytosis, elevated protein and lymphocytic predominance without any bacteria.  Initial thoughts were DVT causing her fevers.  Aseptic meningitis-treated with 3 days of Decadron.  Course further complicated by abdominal pain with GI consultation.  EGD on 10/29 showed esophagitis and gastric ulcers for which GI recommended PPI twice daily.  No biopsies were taken as patient was on Eliquis.  Repeat CT of the abdomen pelvis with contrast showed loculated fluid collection at the tip of the VP shunt raising concerns for VP shunt infection.  Neurosurgery consulted, repeat LP performed on 10/30/2019.   Patient is currently on antibiotics, Vanco, cefepime and metronidazole  Repeat CT abdomen and pelvis with contrast done on 11/03/2019 showed identification of intra-abdominal shunt with 1 position in the lower right hemiabdomen and second portion in the upper right hemiabdomen anterior to cecum.  The fluid collection around the upper hemiabdominal catheter has resolved.  Small volume ascites in the abdomen and pelvis.  Thickened, inflamed appearance of the distal colon and rectum which has improved consistent with resolving infection inflammatory colitis.  Assessment & Plan:   Principal Problem:   Infection of VP (ventriculoperitoneal) shunt (HCC) Active Problems:   Hydrocephalus (HCC)   VP (ventriculoperitoneal) shunt status   Encephalopathy  Abdominal pain   Nuchal rigidity   Aseptic meningitis   Constipation   Deep vein thrombosis (DVT) of femoral vein of left lower extremity (HCC)   AMS (altered mental status)  Fever likely secondary to multifocal pneumonia and possible VP shunt infection nfection possibly infected shunt..  Cultures remain negative, GI panel negative.  CT of the abdomen pelvis with contrast on 10/30 showed loculated fluid around tip of VP shunt in the left anterior pelvis.  Neurosurgery following. -Initial LP 10/17/2019-unrevealing.                                                         . -Repeat CT of the abdomen pelvis 11/03/2019 showed improvement in fluid collection and infectious/inflammatory colitis likely from antibiotic therapy. -Neurosurgery and infectious disease on board -Continue antibiotics as below through 11/10/2019  Acute hypoxic respiratory failure secondary to multifocal pneumonia -Chest x-ray showed bilateral pulmonary infiltrates at the bases. -Currently on IV vancomycin, cefepime, Flagyl.  Breathing has remained stable and not on O2. -Infectious disease team following. -Aggressive bronchodilators.  Incentive spirometer and flutter valve if she uses it.  Abdominal discomfort, nonspecific -Abdominal x-ray showed ileus versus possible small bowel obstruction.  Esophagitis and gastric ulcers could also be playing a role as well as constipation -Repeat CT abdomen and pelvis on 11/03/2019 shows significant improvement in fluid collection as well as inflammation in the distal colon and rectum  Persistent sinus tachycardia with uncontrolled elevated blood pressure.   Possibly inappropriate sinus tachycardia versus ongoing infection. May consider ivabradine if infection is ruled out and heart rate is  not controlled beta-blocker or calcium channel blocker.  Esophagitis and nonbleeding gastric ulcers Continue with Protonix twice daily Continue with Carafate 4 times daily.  Acute left lower  extremity DVT -Continue anticoagulation.  Iron deficiency anemia -Iron supplements.  Bowel regimen.  Dysphagia -Aspiration precaution  Seizure disorder Continue with antiepileptic drugs .  DVT prophylaxis: Heparin drip Code Status: Full code Family Communication: Mother at bedside Disposition Plan: Pending clinical improvement   Consultants:   Neurosurgery  Infectious disease   Subjective: Mother at bedside states the patient looks better than yesterday and that she had a small bowel movement yesterday.  She appears to be groaning less out of abdominal discomfort per the mother.  Objective: Vitals:   11/05/19 2127 11/06/19 0044 11/06/19 0401 11/06/19 0445  BP:  117/86 (!) 144/102 (!) 148/101  Pulse: (!) 104 95 96 99  Resp:  19  16  Temp:  98.7 F (37.1 C) 98.6 F (37 C)   TempSrc:  Axillary Axillary   SpO2:  97% 97% 97%  Weight:      Height:        Intake/Output Summary (Last 24 hours) at 11/06/2019 1453 Last data filed at 11/06/2019 0800 Gross per 24 hour  Intake 2608.45 ml  Output 1950 ml  Net 658.45 ml   Filed Weights   10/13/19 2006 10/14/19 0237 10/31/19 2122  Weight: 74.4 kg 74.7 kg 81.3 kg    Examination:  General exam: Slight distress due to abdominal discomfort Respiratory system: Diminished at bases Cardiovascular system: S1 & S2 heard, RRR. No JVD, murmurs, rubs, gallops or clicks. No pedal edema. Gastrointestinal system: Abdomen mildly distended.  Slightly diminished bowel sounds. Central nervous system: Alert to her name only which is at baseline Extremities: Contracted upper extremities Skin: No rashes, lesions or ulcers Psychiatry: Poor judgment and insight  External urinary catheter Right neck triple-lumen catheter 10/27/2019.    LOS: 23 days   Total time spent for this encounter is 25 minutes    Charolotte Capuchin, MD [facility] If 7PM-7AM, please contact night-coverage  11/06/2019, 2:53 PM

## 2019-11-06 NOTE — Progress Notes (Signed)
[facility]  Date of Admission:  10/13/2019     Total days of antibiotics 12         ASSESSMENT:  Tracey Graham has remained afebrile with new CT scan showing resolution of abdominal catheter pseduocyst and colitis. Distention likely related to constipation despite previous diarrhea on 11/4.  Will plan to continue vancomycin, cefepime and metronidazole through the end of the week with end date being 11/10/19 as it appears that source control has been achieved. Renal function stable with vancomycin and no evidence of nephrotoxicity.   PLAN:  1. Continue vancomycin, cefepime and metronidazole through 11/10/19 2. Monitor renal function while on vancomycin and dose per pharmacy protocol.   ID will sign off and be available as needed.   Principal Problem:   Infection of VP (ventriculoperitoneal) shunt (HCC) Active Problems:   Hydrocephalus (HCC)   VP (ventriculoperitoneal) shunt status   Encephalopathy   Abdominal pain   Nuchal rigidity   Aseptic meningitis   Constipation   Deep vein thrombosis (DVT) of femoral vein of left lower extremity ([place])   AMS (altered mental status)   . Chlorhexidine Gluconate Cloth  6 each Topical Daily  . dextromethorphan-guaiFENesin  2 tablet Oral BID  . dicyclomine  10 mg Oral TID AC & HS  . ferrous sulfate  325 mg Oral Q breakfast  . metoprolol tartrate  12.5 mg Oral TID  . pantoprazole sodium  40 mg Oral Daily  . phenobarbital  97.2 mg Oral QHS  . potassium chloride  40 mEq Oral Once  . sodium chloride flush  10-40 mL Intracatheter Q12H  . sucralfate  1 g Oral TID WC & HS    SUBJECTIVE:  Afebrile overnight with stable white blood cell count.  No acute events.  She does continue to have abdominal pain that appears to be improving per her mother.  Has not had a full bowel movement just small little smears per nursing.  No Active Allergies   Review of Systems: Review of Systems  Unable to perform ROS: Medical  condition      OBJECTIVE: Vitals:   11/05/19 2127 11/06/19 0044 11/06/19 0401 11/06/19 0445  BP:  117/86 (!) 144/102 (!) 148/101  Pulse: (!) 104 95 96 99  Resp:  19  16  Temp:  98.7 F (37.1 C) 98.6 F (37 C)   TempSrc:  Axillary Axillary   SpO2:  97% 97% 97%  Weight:      Height:       Body mass index is 30.77 kg/m.  Physical Exam Constitutional:      General: She is not in acute distress.    Appearance: She is well-developed.     Comments: Lying in bed; lethargic  Cardiovascular:     Rate and Rhythm: Normal rate and regular rhythm.     Heart sounds: Normal heart sounds.  Pulmonary:     Effort: Pulmonary effort is normal.     Breath sounds: Normal breath sounds.  Abdominal:     General: Bowel sounds are normal. There is distension.     Tenderness: There is abdominal tenderness in the epigastric area. There is no guarding or rebound. Negative signs include Murphy's sign and McBurney's sign.  Skin:    General: Skin is warm and dry.  Neurological:     Mental Status: She is alert and oriented to person, place, and time.  Psychiatric:        Behavior: Behavior normal.  Thought Content: Thought content normal.        Judgment: Judgment normal.     Lab Results Lab Results  Component Value Date   WBC 12.2 (H) 11/06/2019   HGB 9.9 (L) 11/06/2019   HCT 28.0 (L) 11/06/2019   MCV 81.2 11/06/2019   PLT 313 11/06/2019    Lab Results  Component Value Date   CREATININE 0.45 11/06/2019   BUN <5 (L) 11/06/2019   NA 140 11/06/2019   K 3.2 (L) 11/06/2019   CL 107 11/06/2019   CO2 25 11/06/2019    Lab Results  Component Value Date   ALT 94 (H) 11/03/2019   AST 101 (H) 11/03/2019   ALKPHOS 67 11/03/2019   BILITOT 0.6 11/03/2019     Microbiology: Recent Results (from the past 240 hour(s))  Culture, blood (routine x 2)     Status: None   Collection Time: 10/27/19 11:15 PM   Specimen: BLOOD RIGHT HAND  Result Value Ref Range Status   Specimen Description  BLOOD RIGHT HAND  Final   Special Requests   Final    AEROBIC BOTTLE ONLY Blood Culture results may not be optimal due to an inadequate volume of blood received in culture bottles   Culture   Final    NO GROWTH 5 DAYS Performed at [facility], [address], [place], [place] [address]    Report Status 11/01/2019 FINAL  Final  Culture, blood (routine x 2)     Status: None   Collection Time: 10/27/19 11:20 PM   Specimen: BLOOD LEFT HAND  Result Value Ref Range Status   Specimen Description BLOOD LEFT HAND  Final   Special Requests AEROBIC BOTTLE ONLY Blood Culture adequate volume  Final   Culture   Final    NO GROWTH 5 DAYS Performed at [facility], [address] [place], [place] [address]    Report Status 11/01/2019 FINAL  Final  Cath Tip Culture     Status: None   Collection Time: 10/28/19  3:39 AM   Specimen: Catheter Tip  Result Value Ref Range Status   Specimen Description CATH TIP  Final   Special Requests NONE  Final   Culture   Final    NO GROWTH 3 DAYS Performed at [facility], [address], [place], [place] [address]    Report Status 10/31/2019 FINAL  Final  Gastrointestinal Panel by PCR , Stool     Status: None   Collection Time: 10/28/19  1:30 PM   Specimen: Stool  Result Value Ref Range Status   Campylobacter species NOT DETECTED NOT DETECTED Final   Plesimonas shigelloides NOT DETECTED NOT DETECTED Final   Salmonella species NOT DETECTED NOT DETECTED Final   Yersinia enterocolitica NOT DETECTED NOT DETECTED Final   Vibrio species NOT DETECTED NOT DETECTED Final   Vibrio cholerae NOT DETECTED NOT DETECTED Final   Enteroaggregative E coli (EAEC) NOT DETECTED NOT DETECTED Final   Enteropathogenic E coli (EPEC) NOT DETECTED NOT DETECTED Final   Enterotoxigenic E coli (ETEC) NOT DETECTED NOT DETECTED Final   Shiga like toxin producing E coli (STEC) NOT DETECTED NOT DETECTED Final   Shigella/Enteroinvasive E coli (EIEC) NOT DETECTED NOT  DETECTED Final   Cryptosporidium NOT DETECTED NOT DETECTED Final   Cyclospora cayetanensis NOT DETECTED NOT DETECTED Final   Entamoeba histolytica NOT DETECTED NOT DETECTED Final   Giardia lamblia NOT DETECTED NOT DETECTED Final   Adenovirus F40/41 NOT DETECTED NOT DETECTED  Final   Astrovirus NOT DETECTED NOT DETECTED Final   Norovirus GI/GII NOT DETECTED NOT DETECTED Final   Rotavirus A NOT DETECTED NOT DETECTED Final   Sapovirus (I, II, IV, and V) NOT DETECTED NOT DETECTED Final    Comment: Performed at [facility], [address] [place], [place] [address]  MRSA PCR Screening     Status: None   Collection Time: 10/28/19  1:30 PM   Specimen: Nasal Mucosa; Nasopharyngeal  Result Value Ref Range Status   MRSA by PCR NEGATIVE NEGATIVE Final    Comment:        The GeneXpert MRSA Assay (FDA approved for NASAL specimens only), is one component of a comprehensive MRSA colonization surveillance program. It is not intended to diagnose MRSA infection nor to guide or monitor treatment for MRSA infections. Performed at [facility], [address] [place], [place] [address]      Marcos EkeGreg Calone, NP [facility] [phone] Pager  11/06/2019  10:26 AM

## 2019-11-07 LAB — CBC
HCT: 27.4 % — ABNORMAL LOW (ref 36.0–46.0)
Hemoglobin: 9.7 g/dL — ABNORMAL LOW (ref 12.0–15.0)
MCH: 28.7 pg (ref 26.0–34.0)
MCHC: 35.4 g/dL (ref 30.0–36.0)
MCV: 81.1 fL (ref 80.0–100.0)
Platelets: 302 10*3/uL (ref 150–400)
RBC: 3.38 MIL/uL — ABNORMAL LOW (ref 3.87–5.11)
RDW: 15.3 % (ref 11.5–15.5)
WBC: 9.7 10*3/uL (ref 4.0–10.5)
nRBC: 0 % (ref 0.0–0.2)

## 2019-11-07 LAB — HEPARIN LEVEL (UNFRACTIONATED)
Heparin Unfractionated: 0.16 IU/mL — ABNORMAL LOW (ref 0.30–0.70)
Heparin Unfractionated: 0.22 IU/mL — ABNORMAL LOW (ref 0.30–0.70)
Heparin Unfractionated: 0.74 IU/mL — ABNORMAL HIGH (ref 0.30–0.70)

## 2019-11-07 MED ORDER — ENSURE ENLIVE PO LIQD
237.0000 mL | Freq: Two times a day (BID) | ORAL | Status: DC
  Administered 2019-11-07 – 2019-11-11 (×6): 237 mL via ORAL

## 2019-11-07 NOTE — Progress Notes (Signed)
ANTICOAGULATION CONSULT NOTE  Pharmacy Consult: Heparin Indication: DVT   No Active Allergies  Patient Measurements: Height: 5\' 4"  (162.6 cm) Weight: 179 lb 3.7 oz (81.3 kg) IBW/kg (Calculated) : 54.7 Heparin Dosing Weight: 70kg  Vital Signs: Temp: 98.6 F (37 C) (11/11 0500) Temp Source: Axillary (11/11 0500) BP: 124/83 (11/11 0500) Pulse Rate: 90 (11/11 0500)  Labs: Recent Labs    11/05/19 0200  11/06/19 0453 11/06/19 0454 11/06/19 1417 11/07/19 0012 11/07/19 0430  HGB 10.1*  --  9.9*  --   --   --  9.7*  HCT 29.0*  --  28.0*  --   --   --  27.4*  PLT 317  --  313  --   --   --  302  HEPARINUNFRC <0.10*   < >  --  1.54* 1.26* 0.74*  --   CREATININE  --   --  0.45  --   --   --   --    < > = values in this interval not displayed.    Estimated Creatinine Clearance: 103.1 mL/min (by C-G formula based on SCr of 0.45 mg/dL).   Assessment: 33 y.o. female with recent DVT, Eliquis on hold. Patient continues on IV heparin, level is low this morning after rate adjustment overnight. No bleeding issues noted. CBC stable.   Goal of Therapy:  Heparin level 0.3-0.7 units/ml Monitor platelets by anticoagulation protocol: Yes    Plan:  Increase heparin back to 700 units/hr Re-check heparin level in 8 hours  Erin Hearing PharmD., BCPS Clinical Pharmacist 11/07/2019 7:57 AM

## 2019-11-07 NOTE — Progress Notes (Signed)
Initial Nutrition Assessment  DOCUMENTATION CODES:   Not applicable  INTERVENTION:    Ensure Enlive po BID, each supplement provides 350 kcal and 20 grams of protein  MVI daily   NUTRITION DIAGNOSIS:   Inadequate oral intake related to decreased appetite as evidenced by per patient/family report.  GOAL:   Patient will meet greater than or equal to 90% of their needs  MONITOR:   PO intake, Supplement acceptance, Labs, Weight trends, I & O's  REASON FOR ASSESSMENT:   LOS    ASSESSMENT:   Patient with PMH significant for cerebral palsy, wheelchair bound, VP shunt for hydrocephalus, seizure disorder, and intellectual disability. Presents this admission with multifocal PNA and VP shunt infection.   Spoke with father at beside. Denies pt had poor appetite PTA. Typically eats 5-6 meals daily without complication. Appetite started to decline once admitted. Meal completions charted as 15-75%. Pt having trouble swallowing meats this admission. Pt currently on a soft diet after MBS on 11/4. Will monitor intakes and provide pt with Ensure to maximize protein/kcal.   Father endorses pt's UBW stays around 190 lb and denies recent wt loss. Records are limited in weight history over the last year.   I/O: +3,549 ml since 10/28 UOP: 2,350 ml x 24 hrs    Drips: D5 in NS @ 50 ml/hr  Medications: ferrous sulfate Labs: K 3.2 (L) Mg 1.6 (L)   NUTRITION - FOCUSED PHYSICAL EXAM:    Most Recent Value  Orbital Region  No depletion  Upper Arm Region  No depletion  Thoracic and Lumbar Region  Unable to assess  Buccal Region  No depletion  Temple Region  No depletion  Clavicle Bone Region  No depletion  Clavicle and Acromion Bone Region  No depletion  Scapular Bone Region  Unable to assess  Dorsal Hand  No depletion  Patellar Region  No depletion  Anterior Thigh Region  No depletion  Posterior Calf Region  No depletion  Edema (RD Assessment)  Moderate  Hair  Reviewed  Eyes  Reviewed   Mouth  Reviewed  Skin  Reviewed  Nails  Reviewed       Diet Order:   Diet Order            DIET SOFT Room service appropriate? Yes; Fluid consistency: Thin  Diet effective now              EDUCATION NEEDS:   Education needs have been addressed  Skin:  Skin Assessment: Skin Integrity Issues: Skin Integrity Issues:: Other (Comment) Other: MASD- groin, perineum  Last BM:  11/11  Height:   Ht Readings from Last 1 Encounters:  10/14/19 5\' 4"  (1.626 m)    Weight:   Wt Readings from Last 1 Encounters:  10/31/19 81.3 kg    Ideal Body Weight:  54.5 kg  BMI:  Body mass index is 30.77 kg/m.  Estimated Nutritional Needs:   Kcal:  1600-1800 kcal  Protein:  80-95 grams  Fluid:  >/= 1.6 L/day   Mariana Single RD, LDN Clinical Nutrition Pager # [phone]

## 2019-11-07 NOTE — Progress Notes (Signed)
Pharmacy Antibiotic Note  Tracey Graham is a 33 y.o. female admitted on 10/13/2019 with VP shunt infection.  Pharmacy has been consulted for vancomycin/cefepime dosing. Also on Flagyl per MD. Vancomycin dosing was transitioned to dosing per nomogram due to concern for possible aseptic meningitis.   SCr stable at 0.4. No fevers noted overnight. WBC has trended down to normal limits. Stop dates placed by ID service of 11/14. No further levels indicated.    Plan: Continue vancomycin to 750 mg IV q12h Continue Cefepime 2g IV q8h Continue Flagyl 500mg  IV q8h Monitor clinical progress, renal function   Height: 5\' 4"  (162.6 cm) Weight: 179 lb 3.7 oz (81.3 kg) IBW/kg (Calculated) : 54.7  Temp (24hrs), Avg:98.7 F (37.1 C), Min:98.5 F (36.9 C), Max:99.1 F (37.3 C)  Recent Labs  Lab 11/01/19 0500 11/01/19 1100 11/01/19 1500 11/02/19 0630 11/03/19 0247 11/03/19 2350 11/04/19 0206 11/04/19 0602 11/05/19 0200 11/06/19 0453 11/07/19 0430  WBC 16.5*  --   --  14.6* 13.6*  --   --  14.0* 12.2* 12.2* 9.7  CREATININE 0.48  --  0.55 0.55 0.52  --   --   --   --  0.45  --   VANCOTROUGH  --  21*  --   --   --  22*  --   --   --   --   --   VANCOPEAK  --   --   --   --   --   --  63*  --   --   --   --     Estimated Creatinine Clearance: 103.1 mL/min (by C-G formula based on SCr of 0.45 mg/dL).    No Active Allergies  Antimicrobials this admission: Unasyn 10/30>10/31 Azith 10/30> 11/2 Vanc 10/31 >> (11/14) Cefepime 10/31 >> (11/14) Flagyl 10/31 >> (11/14)  Microbiology results: 10/17 BCx - negative 10/21 CSF - negative (Lymps 88, Neut 8, gluc wnl, protein high) 10/21 HSV pcr - negative 10/23 BCx - negative 10/29 BCx - negative 10/31 BCx - NGTD 11/1 GI panel PCR - negative 11/1 cath tip - negative  Erin Hearing PharmD., BCPS Clinical Pharmacist 11/07/2019 8:41 AM

## 2019-11-07 NOTE — Progress Notes (Signed)
ANTICOAGULATION CONSULT NOTE  Pharmacy Consult: Heparin Indication: DVT   No Active Allergies  Patient Measurements: Height: 5\' 4"  (162.6 cm) Weight: 179 lb 3.7 oz (81.3 kg) IBW/kg (Calculated) : 54.7 Heparin Dosing Weight: 70kg  Vital Signs: Temp: 99.1 F (37.3 C) (11/11 1926) Temp Source: Axillary (11/11 1926) BP: 128/101 (11/11 1926) Pulse Rate: 111 (11/11 1926)  Labs: Recent Labs    11/05/19 0200  11/06/19 0453  11/07/19 0012 11/07/19 0430 11/07/19 1034 11/07/19 2000  HGB 10.1*  --  9.9*  --   --  9.7*  --   --   HCT 29.0*  --  28.0*  --   --  27.4*  --   --   PLT 317  --  313  --   --  302  --   --   HEPARINUNFRC <0.10*   < >  --    < > 0.74*  --  0.16* 0.22*  CREATININE  --   --  0.45  --   --   --   --   --    < > = values in this interval not displayed.    Estimated Creatinine Clearance: 103.1 mL/min (by C-G formula based on SCr of 0.45 mg/dL).   Assessment: 33 y.o. female with recent DVT, Eliquis on hold. Patient continues on IV heparin.  Heparin level is sub-therapeutic but trending up.  No issue with heparin infusion nor bleeding per RN.  Lab drawn appropriately.  Goal of Therapy:  Heparin level 0.3-0.7 units/ml Monitor platelets by anticoagulation protocol: Yes    Plan:  Increase heparin gtt to 850 units/hr F/U AM labs  [name] D. [name], PharmD, BCPS, [place] 11/07/2019, 9:56 PM

## 2019-11-07 NOTE — Progress Notes (Signed)
ANTICOAGULATION CONSULT NOTE  Pharmacy Consult: Heparin Indication: DVT   No Active Allergies  Patient Measurements: Height: 5\' 4"  (162.6 cm) Weight: 179 lb 3.7 oz (81.3 kg) IBW/kg (Calculated) : 54.7 Heparin Dosing Weight: 70kg  Vital Signs: Temp: 98.5 F (36.9 C) (11/11 0004) Temp Source: Axillary (11/11 0004) BP: 118/77 (11/11 0004) Pulse Rate: 84 (11/11 0004)  Labs: Recent Labs    11/04/19 0602  11/05/19 0200  11/06/19 0453 11/06/19 0454 11/06/19 1417 11/07/19 0012  HGB 10.4*  --  10.1*  --  9.9*  --   --   --   HCT 29.9*  --  29.0*  --  28.0*  --   --   --   PLT 354  --  317  --  313  --   --   --   HEPARINUNFRC  --    < > <0.10*   < >  --  1.54* 1.26* 0.74*  CREATININE  --   --   --   --  0.45  --   --   --    < > = values in this interval not displayed.    Estimated Creatinine Clearance: 103.1 mL/min (by C-G formula based on SCr of 0.45 mg/dL).   Assessment: 33 y.o. female with recent DVT, Eliquis on hold, for heparin  11/11 AM update:  Heparin level still elevated but trending down No issues per RN  Goal of Therapy:  Heparin level 0.3-0.7 units/ml Monitor platelets by anticoagulation protocol: Yes    Plan:  Dec heparin to 500 units/hr Re-check heparin level in 8 hours  [name], PharmD, [place] Pharmacist Phone: [phone]

## 2019-11-07 NOTE — Progress Notes (Signed)
PROGRESS NOTE    Tracey Graham  PQZ:[id number] DOB: 17-May-1986 DOA: 10/13/2019 PCP: Clinic, General Medical   Brief Narrative:  33 year old with history of cerebral palsy, wheelchair-bound/fully dependent on ADL S, VP shunt for hydrocephalus, seizure disorder, intellectual disability came to the ER with complaints of fever abdominal pain.  Initial SIRS, COVID-19-negative.  CT of the abdomen pelvis was overall negative.  CT head-negative.  Work-up showed left lower extremity acute DVT started on anticoagulation.  LP initially performed 10/21 which showed pleocytosis, elevated protein and lymphocytic predominance without any bacteria.  Initial thoughts were DVT causing her fevers.  Aseptic meningitis-treated with 3 days of Decadron.  Course further complicated by abdominal pain with GI consultation.  EGD on 10/29 showed esophagitis and gastric ulcers for which GI recommended PPI twice daily.  No biopsies were taken as patient was on Eliquis.  Repeat CT of the abdomen pelvis with contrast showed loculated fluid collection at the tip of the VP shunt raising concerns for VP shunt infection.  Neurosurgery consulted, repeat LP performed on 10/30/2019.   Patient is currently on antibiotics, Vanco, cefepime and metronidazole  Repeat CT abdomen and pelvis with contrast done on 11/03/2019 showed identification of intra-abdominal shunt with 1 position in the lower right hemiabdomen and second portion in the upper right hemiabdomen anterior to cecum.  The fluid collection around the upper hemiabdominal catheter has resolved.  Small volume ascites in the abdomen and pelvis.  Thickened, inflamed appearance of the distal colon and rectum which has improved consistent with resolving infection inflammatory colitis.  Assessment & Plan:   Principal Problem:   Infection of VP (ventriculoperitoneal) shunt (HCC) Active Problems:   Hydrocephalus (HCC)   VP (ventriculoperitoneal) shunt status   Encephalopathy  Abdominal pain   Nuchal rigidity   Aseptic meningitis   Constipation   Deep vein thrombosis (DVT) of femoral vein of left lower extremity (HCC)   AMS (altered mental status)  Fever likely secondary to multifocal pneumonia, possible VP shunt infection and distal colitis nfection possibly infected shunt..  Cultures remain negative, GI panel negative.  CT of the abdomen pelvis with contrast on 10/30 showed loculated fluid around tip of VP shunt in the left anterior pelvis.  Neurosurgery following. -Initial LP 10/17/2019-unrevealing.                                                         . -Repeat CT of the abdomen pelvis 11/03/2019 showed improvement in fluid collection and infectious/inflammatory colitis likely from antibiotic therapy. -Neurosurgery and infectious disease on board -Continue antibiotics as below through 11/10/2019  Acute hypoxic respiratory failure secondary to multifocal pneumonia -Chest x-ray showed bilateral pulmonary infiltrates at the bases. -Currently on IV vancomycin, cefepime, Flagyl.  Breathing has remained stable and not on O2. -Infectious disease team following. -Aggressive bronchodilators.  Incentive spirometer and flutter valve if she uses it.  Abdominal discomfort, nonspecific -Abdominal x-ray showed ileus versus possible small bowel obstruction.  Esophagitis and gastric ulcers could also be playing a role as well as constipation -Repeat CT abdomen and pelvis on 11/03/2019 shows significant improvement in fluid collection as well as inflammation in the distal colon and rectum -Continue bowel regimen  Persistent sinus tachycardia with uncontrolled elevated blood pressure.   Possibly inappropriate sinus tachycardia versus ongoing infection. May consider ivabradine if infection is ruled  out and heart rate is not controlled beta-blocker or calcium channel blocker.  Esophagitis and nonbleeding gastric ulcers Continue with Protonix twice daily Continue with  Carafate 4 times daily.  Acute left lower extremity DVT -Continue anticoagulation.  Iron deficiency anemia -Iron supplements.  Bowel regimen.  Dysphagia -Aspiration precaution  Seizure disorder Continue with antiepileptic drugs .  DVT prophylaxis: Heparin drip Code Status: Full code Family Communication: Mother at bedside Disposition Plan: Pending clinical improvement   Consultants:   Neurosurgery  Infectious disease   Subjective: Father at bedside today and states he believes the patient is approximately 30% of her baseline but he has seen improvement in her over the past week or so.  He does feel that her moaning and groaning regarding her abdominal discomfort has improved.  Objective: Vitals:   11/07/19 0004 11/07/19 0500 11/07/19 0820 11/07/19 1217  BP: 118/77 124/83 120/74 (!) 143/74  Pulse: 84 90 (!) 106 94  Resp: (!) 21 19 15 14   Temp: 98.5 F (36.9 C) 98.6 F (37 C) 98.7 F (37.1 C) 99.1 F (37.3 C)  TempSrc: Axillary Axillary Axillary Axillary  SpO2: 97% 97%    Weight:      Height:        Intake/Output Summary (Last 24 hours) at 11/07/2019 1522 Last data filed at 11/07/2019 1030 Gross per 24 hour  Intake 1364.58 ml  Output 3100 ml  Net -1735.42 ml   Filed Weights   10/13/19 2006 10/14/19 0237 10/31/19 2122  Weight: 74.4 kg 74.7 kg 81.3 kg    Examination:  General exam: Laying in bed in no acute distress Respiratory system: Diminished at bases Cardiovascular system: S1 & S2 heard, RRR. No JVD, murmurs, rubs, gallops or clicks. No pedal edema. Gastrointestinal system: Abdomen minimally distended.  Slightly diminished bowel sounds. Central nervous system: Alert to her name only which is at baseline Extremities: Contracted upper extremities Skin: No rashes, lesions or ulcers Psychiatry: Poor judgment and insight  External urinary catheter Right neck triple-lumen catheter 10/27/2019.    LOS: 24 days   Total time spent for this encounter  is 25 minutes    10/29/2019, MD [facility] If 7PM-7AM, please contact night-coverage  11/07/2019, 3:22 PM

## 2019-11-08 LAB — BASIC METABOLIC PANEL
Anion gap: 8 (ref 5–15)
BUN: <5 mg/dL — ABNORMAL LOW (ref 6–20)
CO2: 24 mmol/L (ref 22–32)
Calcium: 8.7 mg/dL — ABNORMAL LOW (ref 8.9–10.3)
Chloride: 107 mmol/L (ref 98–111)
Creatinine, Ser: 0.59 mg/dL (ref 0.44–1.00)
GFR calc Af Amer: >60 mL/min (ref >60)
GFR calc non Af Amer: >60 mL/min (ref >60)
Glucose, Bld: 123 mg/dL — ABNORMAL HIGH (ref 70–99)
Potassium: 3.5 mmol/L (ref 3.5–5.1)
Sodium: 139 mmol/L (ref 135–145)

## 2019-11-08 LAB — CBC
HCT: 28 % — ABNORMAL LOW (ref 36.0–46.0)
Hemoglobin: 9.7 g/dL — ABNORMAL LOW (ref 12.0–15.0)
MCH: 28.3 pg (ref 26.0–34.0)
MCHC: 34.6 g/dL (ref 30.0–36.0)
MCV: 81.6 fL (ref 80.0–100.0)
Platelets: 318 10*3/uL (ref 150–400)
RBC: 3.43 MIL/uL — ABNORMAL LOW (ref 3.87–5.11)
RDW: 15.5 % (ref 11.5–15.5)
WBC: 9.8 10*3/uL (ref 4.0–10.5)
nRBC: 0 % (ref 0.0–0.2)

## 2019-11-08 LAB — HEPARIN LEVEL (UNFRACTIONATED): Heparin Unfractionated: 0.33 IU/mL (ref 0.30–0.70)

## 2019-11-08 NOTE — Progress Notes (Signed)
ANTICOAGULATION CONSULT NOTE  Pharmacy Consult: Heparin Indication: DVT   No Active Allergies  Patient Measurements: Height: 5\' 4"  (162.6 cm) Weight: 179 lb 3.7 oz (81.3 kg) IBW/kg (Calculated) : 54.7 Heparin Dosing Weight: 70kg  Vital Signs: Temp: 99.2 F (37.3 C) (11/12 0905) Temp Source: Axillary (11/12 0905) BP: 123/99 (11/12 0905) Pulse Rate: 117 (11/12 0905)  Labs: Recent Labs    11/06/19 0453  11/07/19 0012 11/07/19 0430 11/07/19 1034 11/07/19 2000 11/08/19 0406  HGB 9.9*  --   --  9.7*  --   --  9.7*  HCT 28.0*  --   --  27.4*  --   --  28.0*  PLT 313  --   --  302  --   --  318  HEPARINUNFRC  --    < > 0.74*  --  0.16* 0.22*  --   CREATININE 0.45  --   --   --   --   --   --    < > = values in this interval not displayed.    Estimated Creatinine Clearance: 103.1 mL/min (by C-G formula based on SCr of 0.45 mg/dL).   Assessment: 33 y.o. female with recent DVT, Eliquis on hold. Patient continues on IV heparin.  Heparin level this morning is at goal. No issue with heparin infusion or bleeding per RN.   Goal of Therapy:  Heparin level 0.3-0.7 units/ml Monitor platelets by anticoagulation protocol: Yes    Plan:  Continue heparin gtt at 850 units/hr F/U AM labs  Erin Hearing PharmD., BCPS Clinical Pharmacist 11/08/2019 9:16 AM

## 2019-11-08 NOTE — Progress Notes (Signed)
PROGRESS NOTE    Tracey Graham  WIO:[id number] DOB: July 02, 1986 DOA: 10/13/2019 PCP: Clinic, [facility]   Brief Narrative:  33 year old with history of cerebral palsy, wheelchair-bound/fully dependent on ADL S, VP shunt for hydrocephalus, seizure disorder, intellectual disability came to the ER with complaints of fever abdominal pain.  Initial SIRS, COVID-19-negative.  CT of the abdomen pelvis was overall negative.  CT head-negative.  Work-up showed left lower extremity acute DVT started on anticoagulation.  LP initially performed 10/21 which showed pleocytosis, elevated protein and lymphocytic predominance without any bacteria.  Initial thoughts were DVT causing her fevers.  Aseptic meningitis-treated with 3 days of Decadron.  Course further complicated by abdominal pain with GI consultation.  EGD on 10/29 showed esophagitis and gastric ulcers for which GI recommended PPI twice daily.  No biopsies were taken as patient was on Eliquis.  Repeat CT of the abdomen pelvis with contrast showed loculated fluid collection at the tip of the VP shunt raising concerns for VP shunt infection.  Neurosurgery consulted, repeat LP performed on 10/30/2019.   Patient is currently on antibiotics, Vanco, cefepime and metronidazole  Repeat CT abdomen and pelvis with contrast done on 11/03/2019 showed identification of intra-abdominal shunt with 1 position in the lower right hemiabdomen and second portion in the upper right hemiabdomen anterior to cecum.  The fluid collection around the upper hemiabdominal catheter has resolved.  Small volume ascites in the abdomen and pelvis.  Thickened, inflamed appearance of the distal colon and rectum which has improved consistent with resolving infection inflammatory colitis.  Assessment & Plan:   Principal Problem:   Infection of VP (ventriculoperitoneal) shunt (HCC) Active Problems:   Hydrocephalus (HCC)   VP (ventriculoperitoneal) shunt status   Encephalopathy  Abdominal pain   Nuchal rigidity   Aseptic meningitis   Constipation   Deep vein thrombosis (DVT) of femoral vein of left lower extremity (HCC)   AMS (altered mental status)  Fever likely secondary to multifocal pneumonia, possible VP shunt infection and distal colitis nfection possibly infected shunt..  Cultures remain negative, GI panel negative.  CT of the abdomen pelvis with contrast on 10/30 showed loculated fluid around tip of VP shunt in the left anterior pelvis.  Neurosurgery following. -Initial LP 10/17/2019-unrevealing.                                                         . -Repeat CT of the abdomen pelvis 11/03/2019 showed improvement in fluid collection and infectious/inflammatory colitis likely from antibiotic therapy. -Neurosurgery and infectious disease on board -Continue antibiotics as below through 11/10/2019  Acute hypoxic respiratory failure secondary to multifocal pneumonia; improving -Chest x-ray showed bilateral pulmonary infiltrates at the bases. -Currently on IV vancomycin, cefepime, Flagyl.  Breathing has remained stable and not on O2. -Infectious disease team following. -Aggressive bronchodilators.  Incentive spirometer and flutter valve if she uses it.  Abdominal discomfort, nonspecific; improving -Abdominal x-ray showed ileus versus possible small bowel obstruction.  Esophagitis and gastric ulcers could also be playing a role as well as constipation -Repeat CT abdomen and pelvis on 11/03/2019 shows significant improvement in fluid collection as well as inflammation in the distal colon and rectum -Continue bowel regimen  Persistent sinus tachycardia with uncontrolled elevated blood pressure.   Possibly inappropriate sinus tachycardia versus ongoing infection. May consider ivabradine if infection  is ruled out and heart rate is not controlled beta-blocker or calcium channel blocker.  Esophagitis and nonbleeding gastric ulcers Continue with Protonix twice daily  Continue with Carafate 4 times daily.  Acute left lower extremity DVT -Continue anticoagulation.  Iron deficiency anemia -Iron supplements.  Bowel regimen.  Dysphagia -Aspiration precaution  Seizure disorder Continue with antiepileptic drugs .  DVT prophylaxis: Heparin drip Code Status: Full code Family Communication: Mother at bedside Disposition Plan: Pending clinical improvement   Consultants:   Neurosurgery  Infectious disease   Subjective: Mother at bedside today without any major concerns.  Patient appears to be clinically improving with each passing day very gradually.  Objective: Vitals:   11/07/19 2337 11/08/19 0352 11/08/19 0905 11/08/19 1152  BP: 127/77 (!) 139/97 (!) 123/99 112/83  Pulse: 95 95 (!) 117 95  Resp: (!) 31 (!) 22 20 18   Temp: 99.1 F (37.3 C) 98.6 F (37 C) 99.2 F (37.3 C) 99 F (37.2 C)  TempSrc:  Axillary Axillary Axillary  SpO2: 93% 96% 90% 95%  Weight:      Height:        Intake/Output Summary (Last 24 hours) at 11/08/2019 1601 Last data filed at 11/08/2019 0730 Gross per 24 hour  Intake 3761.43 ml  Output 2400 ml  Net 1361.43 ml   Filed Weights   10/13/19 2006 10/14/19 0237 10/31/19 2122  Weight: 74.4 kg 74.7 kg 81.3 kg    Examination:  General exam: Laying in bed in no acute distress Respiratory system: Diminished at bases Cardiovascular system: S1 & S2 heard, RRR. No JVD, murmurs, rubs, gallops or clicks. No pedal edema. Gastrointestinal system: Abdomen minimally distended and tender to palpation.  Normal bowel sounds. Central nervous system: Alert to her name only which is at baseline Extremities: Contracted upper extremities Skin: No rashes, lesions or ulcers Psychiatry: Poor judgment and insight  External urinary catheter Right neck triple-lumen catheter 10/27/2019.    LOS: 25 days   Total time spent for this encounter is 25 minutes    Charolotte Capuchin, MD [facility] If 7PM-7AM, please contact  night-coverage  11/08/2019, 4:01 PM

## 2019-11-08 NOTE — Evaluation (Signed)
Physical Therapy Evaluation Patient Details Name: Tracey Graham MRN: [id number] DOB: 11/21/1986 Today's Date: 11/08/2019   History of Present Illness  Pt is a 33 year old woman admitted 10/14/19 with fever due to PNA vs VP shunt infection and distal colitis. Hospital cource complicated by acute hypoxic respiratory failure, tachycardia, uncontrolled blood pressure, esophagitis and gastric ulcers, L LE DVT and iron deficiency anemia. PMH: Cerebral palsy, intellectual disability, seizures, hydrocephalus, w/c dependent.  Clinical Impression  Pt was seen for mobility after having assisted her to sit up on side of bed.  Practiced to get fair- control of sit balance, but also could demonstrate some flexion control of core to shift sitting posture.  Pt had clonus on L ankle with standing but calmed with some comforting words to pt.  Follow up with further therapy to progress her sitting control and work on transfers to chair to allow pt to be up in her custom personal wc with family.  Will anticipate transfer to home to continue PT as previously was ordered.    Follow Up Recommendations Home health PT;Supervision for mobility/OOB;Supervision/Assistance - 24 hour    Equipment Recommendations  None recommended by PT    Recommendations for Other Services       Precautions / Restrictions Precautions Precautions: Fall Restrictions Weight Bearing Restrictions: No      Mobility  Bed Mobility Overal bed mobility: Needs Assistance Bed Mobility: Supine to Sit;Sit to Supine     Supine to sit: +2 for safety/equipment;+2 for physical assistance;Total assist Sit to supine: +2 for physical assistance;+2 for safety/equipment;Total assist   General bed mobility comments: pivoted   Transfers Overall transfer level: Needs assistance Equipment used: 2 person hand held assist Transfers: Sit to/from Stand Sit to Stand: Total assist;+2 physical assistance;+2 safety/equipment;From elevated surface          General transfer comment: stood with knees blocked, would be a hoyer transfer to nursing  Ambulation/Gait             General Gait Details: unable to walk  Stairs            Wheelchair Mobility    Modified Rankin (Stroke Patients Only)       Balance Overall balance assessment: Needs assistance Sitting-balance support: Bilateral upper extremity supported;Feet supported Sitting balance-Leahy Scale: Poor       Standing balance-Leahy Scale: Zero                               Pertinent Vitals/Pain Pain Assessment: Faces Faces Pain Scale: Hurts little more Pain Location: abdomen Pain Intervention(s): Limited activity within patient's tolerance;Monitored during session;Repositioned    Home Living Family/patient expects to be discharged to:: Private residence Living Arrangements: Parent Available Help at Discharge: Family;Available 24 hours/day;Personal care attendant Type of Home: House Home Access: Ramped entrance     Home Layout: One level Home Equipment: Wheelchair - Banker;Other (comment)      Prior Function Level of Independence: Needs assistance   Gait / Transfers Assistance Needed: mainly is wc bound but walks a few steps with max assist  ADL's / Homemaking Assistance Needed: total dependence  Comments: pt is semi verbal     Hand Dominance   Dominant Hand: Right    Extremity/Trunk Assessment   Upper Extremity Assessment Upper Extremity Assessment: Generalized weakness(flexion tone)    Lower Extremity Assessment Lower Extremity Assessment: Generalized weakness;LLE deficits/detail LLE Deficits / Details: NWB functionally with clonus on standing  LLE Coordination: decreased fine motor;decreased gross motor    Cervical / Trunk Assessment Cervical / Trunk Assessment: Other exceptions(poor to fair- sitting balance control) Cervical / Trunk Exceptions: weakness  Communication   Communication: Expressive  difficulties  Cognition Arousal/Alertness: Awake/alert Behavior During Therapy: WFL for tasks assessed/performed Overall Cognitive Status: History of cognitive impairments - at baseline                                 General Comments: pt has limitations of control of movement and active use of LE"s      General Comments General comments (skin integrity, edema, etc.): pt is unable to actively initiate movement, requires total assist for all mobility    Exercises     Assessment/Plan    PT Assessment Patient needs continued PT services  PT Problem List Decreased strength;Decreased range of motion;Decreased activity tolerance;Decreased balance;Decreased mobility;Decreased coordination;Decreased cognition;Decreased knowledge of use of DME;Decreased safety awareness;Cardiopulmonary status limiting activity;Obesity;Decreased skin integrity;Pain       PT Treatment Interventions DME instruction;Functional mobility training;Therapeutic activities;Therapeutic exercise;Balance training;Neuromuscular re-education;Patient/family education    PT Goals (Current goals can be found in the Care Plan section)  Acute Rehab PT Goals Patient Stated Goal: per mother to go home PT Goal Formulation: With family Time For Goal Achievement: 11/22/19 Potential to Achieve Goals: Fair    Frequency Min 3X/week   Barriers to discharge Decreased caregiver support has one parent with her at a time    Co-evaluation PT/OT/SLP Co-Evaluation/Treatment: Yes Reason for Co-Treatment: For patient/therapist safety;Complexity of the patient's impairments (multi-system involvement);To address functional/ADL transfers PT goals addressed during session: Mobility/safety with mobility;Balance;Strengthening/ROM         AM-PAC PT "6 Clicks" Mobility  Outcome Measure Help needed turning from your back to your side while in a flat bed without using bedrails?: A Lot Help needed moving from lying on your back  to sitting on the side of a flat bed without using bedrails?: A Lot Help needed moving to and from a bed to a chair (including a wheelchair)?: Total Help needed standing up from a chair using your arms (e.g., wheelchair or bedside chair)?: Total Help needed to walk in hospital room?: Total Help needed climbing 3-5 steps with a railing? : Total 6 Click Score: 8    End of Session Equipment Utilized During Treatment: Gait belt Activity Tolerance: Patient limited by fatigue;Treatment limited secondary to medical complications (Comment) Patient left: in bed;with call bell/phone within reach;with bed alarm set;with family/visitor present Nurse Communication: Mobility status PT Visit Diagnosis: Unsteadiness on feet (R26.81);Muscle weakness (generalized) (M62.81);Difficulty in walking, not elsewhere classified (R26.2)    Time: 3235-5732 PT Time Calculation (min) (ACUTE ONLY): 32 min   Charges:   PT Evaluation $PT Eval Moderate Complexity: 1 Mod         Ivar Drape 11/08/2019, 8:25 PM   Samul Dada, PT MS Acute Rehab Dept. Number: [facility] [id number] and [facility] [phone]

## 2019-11-08 NOTE — Evaluation (Signed)
Occupational Therapy Evaluation Patient Details Name: Tracey Graham MRN: [id number] DOB: [birth year] Today's Date: 11/08/2019    History of Present Illness Pt is a 33 year old woman admitted 10/14/19 with fever due to PNA vs VP shunt infection and distal colitis. Hospital cource complicated by acute hypoxic respiratory failure, tachycardia, uncontrolled blood pressure, esophagitis and gastric ulcers, L LE DVT and iron deficiency anemia. PMH: Cerebral palsy, intellectual disability, seizures, hydrocephalus, w/c dependent.   Clinical Impression   Pt was transferring with max assist of one using stand-pivot at home. She could sit at the edge of her bed without support during dressing. Pt is dependent in ADL and legally blind per mom who is bedside. Pt with stable VS throughout session. Requires 2 person assist for all mobility. Plan to follow pt acutely.     Follow Up Recommendations  No OT follow up    Equipment Recommendations  None recommended by OT    Recommendations for Other Services       Precautions / Restrictions Precautions Precautions: Fall Restrictions Weight Bearing Restrictions: No      Mobility Bed Mobility Overal bed mobility: Needs Assistance Bed Mobility: Supine to Sit;Sit to Supine     Supine to sit: +2 for physical assistance;Total assist Sit to supine: +2 for physical assistance;Total assist   General bed mobility comments: used helicopter technique with bed pad to pivot pt to and from EOB, assist for all aspects  Transfers Overall transfer level: Needs assistance   Transfers: Sit to/from Stand Sit to Stand: +2 physical assistance;Total assist         General transfer comment: stood x 2 from EOB with knees blocked and use of bed pad under hips    Balance Overall balance assessment: Needs assistance Sitting-balance support: Bilateral upper extremity supported Sitting balance-Leahy Scale: Poor Sitting balance - Comments: max assist initially  with posterior bias and cervical extension, as she continued to sit, she progressed to min guard assist with head at neutral     Standing balance-Leahy Scale: Zero                             ADL either performed or assessed with clinical judgement   ADL Overall ADL's : At baseline                                             Vision Baseline Vision/History: Legally blind Patient Visual Report: No change from baseline       Perception     Praxis      Pertinent Vitals/Pain Pain Assessment: Faces Faces Pain Scale: Hurts a little bit Pain Location: abdomen Pain Intervention(s): Monitored during session;Repositioned     Hand Dominance Right   Extremity/Trunk Assessment Upper Extremity Assessment Upper Extremity Assessment: Generalized weakness(increased flexor tone, full PROM)   Lower Extremity Assessment Lower Extremity Assessment: Defer to PT evaluation   Cervical / Trunk Assessment Cervical / Trunk Assessment: Other exceptions Cervical / Trunk Exceptions: weakness   Communication Communication Communication: Expressive difficulties   Cognition Arousal/Alertness: Awake/alert Behavior During Therapy: WFL for tasks assessed/performed Overall Cognitive Status: History of cognitive impairments - at baseline  General Comments       Exercises     Shoulder Instructions      Home Living Family/patient expects to be discharged to:: Private residence Living Arrangements: Parent(mom and dad) Available Help at Discharge: Family;Available 24 hours/day;Personal care attendant(aide daily) Type of Home: House Home Access: Ramped entrance     Home Layout: One level     Bathroom Shower/Tub: Walk-in shower(barrier free shower)         Home Equipment: Wheelchair - Banker;Other (comment)(hoyer lift)          Prior Functioning/Environment Level of Independence: Needs  assistance  Gait / Transfers Assistance Needed: w/c bound, transfers bearing weight on LEs with one person assist at home, uses lift at the "center" ADL's / Homemaking Assistance Needed: total dependence            OT Problem List: Impaired balance (sitting and/or standing);Impaired UE functional use;Decreased strength;Decreased cognition;Impaired vision/perception      OT Treatment/Interventions: Balance training;Patient/family education;Therapeutic activities;Therapeutic exercise    OT Goals(Current goals can be found in the care plan section) Acute Rehab OT Goals Patient Stated Goal: return home OT Goal Formulation: With family Time For Goal Achievement: 11/22/19 Potential to Achieve Goals: Good ADL Goals Pt/caregiver will Perform Home Exercise Program: Increased ROM;Both right and left upper extremity;With minimal assist Additional ADL Goal #1: Pt will demonstrate fair sitting balance for dressing at EOB. Additional ADL Goal #2: Pt will transfer with max assist.  OT Frequency: Min 2X/week   Barriers to D/C:            Co-evaluation PT/OT/SLP Co-Evaluation/Treatment: Yes     OT goals addressed during session: Strengthening/ROM      AM-PAC OT "6 Clicks" Daily Activity     Outcome Measure Help from another person eating meals?: Total Help from another person taking care of personal grooming?: Total Help from another person toileting, which includes using toliet, bedpan, or urinal?: Total Help from another person bathing (including washing, rinsing, drying)?: Total Help from another person to put on and taking off regular upper body clothing?: Total Help from another person to put on and taking off regular lower body clothing?: Total 6 Click Score: 6   End of Session    Activity Tolerance: Patient tolerated treatment well Patient left: in bed;with call bell/phone within reach;with family/visitor present  OT Visit Diagnosis: Pain;Muscle weakness (generalized)  (M62.81);Other symptoms and signs involving cognitive function;Unsteadiness on feet (R26.81)                Time: 5465-0354 OT Time Calculation (min): 31 min Charges:  OT General Charges $OT Visit: 1 Visit OT Evaluation $OT Eval Moderate Complexity: 1 Mod  Nestor Lewandowsky, OTR/L Acute Rehabilitation Services Pager: [phone] Office: [phone]  Tracey Graham 11/08/2019, 4:05 PM

## 2019-11-09 LAB — CBC
HCT: 27.2 % — ABNORMAL LOW (ref 36.0–46.0)
Hemoglobin: 9.5 g/dL — ABNORMAL LOW (ref 12.0–15.0)
MCH: 28.3 pg (ref 26.0–34.0)
MCHC: 34.9 g/dL (ref 30.0–36.0)
MCV: 81 fL (ref 80.0–100.0)
Platelets: 294 10*3/uL (ref 150–400)
RBC: 3.36 MIL/uL — ABNORMAL LOW (ref 3.87–5.11)
RDW: 15.5 % (ref 11.5–15.5)
WBC: 8.5 10*3/uL (ref 4.0–10.5)
nRBC: 0 % (ref 0.0–0.2)

## 2019-11-09 LAB — BASIC METABOLIC PANEL
Anion gap: 7 (ref 5–15)
BUN: <5 mg/dL — ABNORMAL LOW (ref 6–20)
CO2: 24 mmol/L (ref 22–32)
Calcium: 8.5 mg/dL — ABNORMAL LOW (ref 8.9–10.3)
Chloride: 107 mmol/L (ref 98–111)
Creatinine, Ser: 0.52 mg/dL (ref 0.44–1.00)
GFR calc Af Amer: >60 mL/min (ref >60)
GFR calc non Af Amer: >60 mL/min (ref >60)
Glucose, Bld: 93 mg/dL (ref 70–99)
Potassium: 3.5 mmol/L (ref 3.5–5.1)
Sodium: 138 mmol/L (ref 135–145)

## 2019-11-09 LAB — HEPARIN LEVEL (UNFRACTIONATED): Heparin Unfractionated: 0.68 IU/mL (ref 0.30–0.70)

## 2019-11-09 MED ORDER — SIMETHICONE 80 MG PO CHEW
80.0000 mg | CHEWABLE_TABLET | Freq: Once | ORAL | Status: AC
  Administered 2019-11-09: 80 mg via ORAL
  Filled 2019-11-09: qty 1

## 2019-11-09 MED ORDER — PANTOPRAZOLE SODIUM 40 MG IV SOLR
40.0000 mg | INTRAVENOUS | Status: DC
  Administered 2019-11-10 – 2019-11-12 (×3): 40 mg via INTRAVENOUS
  Filled 2019-11-09 (×4): qty 40

## 2019-11-09 NOTE — Progress Notes (Signed)
ANTICOAGULATION CONSULT NOTE  Pharmacy Consult: Heparin Indication: DVT   No Active Allergies  Patient Measurements: Height: 5\' 4"  (162.6 cm) Weight: 179 lb 3.7 oz (81.3 kg) IBW/kg (Calculated) : 54.7 Heparin Dosing Weight: 70kg  Vital Signs: Temp: 98.7 F (37.1 C) (11/13 0410) Temp Source: Axillary (11/13 0410) BP: 144/87 (11/13 0410) Pulse Rate: 77 (11/13 0410)  Labs: Recent Labs    11/07/19 0430  11/07/19 2000 11/08/19 0406 11/08/19 0641 11/08/19 0900 11/09/19 0433 11/09/19 0434  HGB 9.7*  --   --  9.7*  --   --  9.5*  --   HCT 27.4*  --   --  28.0*  --   --  27.2*  --   PLT 302  --   --  318  --   --  294  --   HEPARINUNFRC  --    < > 0.22*  --  0.33  --   --  0.68  CREATININE  --   --   --   --   --  0.59 0.52  --    < > = values in this interval not displayed.    Estimated Creatinine Clearance: 103.1 mL/min (by C-G formula based on SCr of 0.52 mg/dL).   Assessment: 32 y.o. female with recent DVT, Eliquis on hold. Patient continues on IV heparin.  Heparin level this morning is at goal but now at upper end. No issue with heparin infusion or bleeding per RN this morning.   Goal of Therapy:  Heparin level 0.3-0.7 units/ml Monitor platelets by anticoagulation protocol: Yes    Plan:  Reduce heparin infusion rate to 800 units/hr F/U AM labs  Erin Hearing PharmD., BCPS Clinical Pharmacist 11/09/2019 9:07 AM

## 2019-11-09 NOTE — Progress Notes (Signed)
PROGRESS NOTE    Tracey Graham  OHY:[id number] DOB: [birth year] DOA: 10/13/2019 PCP: [facility]   Brief Narrative:  33 year old with history of cerebral palsy, wheelchair-bound/fully dependent on ADL S, VP shunt for hydrocephalus, seizure disorder, intellectual disability came to the ER with complaints of fever abdominal pain.  Initial SIRS, COVID-19-negative.  CT of the abdomen pelvis was overall negative.  CT head-negative.  Work-up showed left lower extremity acute DVT started on anticoagulation.  LP initially performed 10/21 which showed pleocytosis, elevated protein and lymphocytic predominance without any bacteria.  Initial thoughts were DVT causing her fevers.  Aseptic meningitis-treated with 3 days of Decadron.  Course further complicated by abdominal pain with GI consultation.  EGD on 10/29 showed esophagitis and gastric ulcers for which GI recommended PPI twice daily.  No biopsies were taken as patient was on Eliquis.  Repeat CT of the abdomen pelvis with contrast showed loculated fluid collection at the tip of the VP shunt raising concerns for VP shunt infection.  Neurosurgery consulted, repeat LP performed on 10/30/2019.   Patient is currently on antibiotics, Vanco, cefepime and metronidazole  Repeat CT abdomen and pelvis with contrast done on 11/03/2019 showed identification of intra-abdominal shunt with 1 position in the lower right hemiabdomen and second portion in the upper right hemiabdomen anterior to cecum.  The fluid collection around the upper hemiabdominal catheter has resolved.  Small volume ascites in the abdomen and pelvis.  Thickened, inflamed appearance of the distal colon and rectum which has improved consistent with resolving infection inflammatory colitis.  Assessment & Plan:   Principal Problem:   Infection of VP (ventriculoperitoneal) shunt (HCC) Active Problems:   Hydrocephalus (HCC)   VP (ventriculoperitoneal) shunt status   Encephalopathy  Abdominal pain   Nuchal rigidity   Aseptic meningitis   Constipation   Deep vein thrombosis (DVT) of femoral vein of left lower extremity (HCC)   AMS (altered mental status)  Fever likely secondary to multifocal pneumonia, possible VP shunt infection and distal colitis nfection possibly infected shunt..  Cultures remain negative, GI panel negative.  CT of the abdomen pelvis with contrast on 10/30 showed loculated fluid around tip of VP shunt in the left anterior pelvis.  Neurosurgery following. -Initial LP 10/17/2019-unrevealing.                                                         . -Repeat CT of the abdomen pelvis 11/03/2019 showed improvement in fluid collection and infectious/inflammatory colitis likely from antibiotic therapy. -Neurosurgery and infectious disease on board -Continue antibiotics as below through 11/10/2019  Acute hypoxic respiratory failure secondary to multifocal pneumonia; improving -Chest x-ray showed bilateral pulmonary infiltrates at the bases. -Currently on IV vancomycin, cefepime, Flagyl.  Breathing has remained stable and not on O2. -Infectious disease team following. -Aggressive bronchodilators.  Incentive spirometer and flutter valve if she uses it.  Abdominal discomfort, nonspecific; improving -Abdominal x-ray showed ileus versus possible small bowel obstruction.  Esophagitis and gastric ulcers could also be playing a role as well as constipation -Repeat CT abdomen and pelvis on 11/03/2019 shows significant improvement in fluid collection as well as inflammation in the distal colon and rectum -Hold as needed bowel regimen as patient now has mild diarrhea  Persistent sinus tachycardia with uncontrolled elevated blood pressure.   Possibly inappropriate sinus tachycardia  versus ongoing infection. May consider ivabradine if infection is ruled out and heart rate is not controlled beta-blocker or calcium channel blocker.  Esophagitis and nonbleeding gastric  ulcers Continue with Protonix twice daily Continue with Carafate 4 times daily.  Acute left lower extremity DVT -Continue anticoagulation.  Iron deficiency anemia -Iron supplements.  Bowel regimen.  Dysphagia -Aspiration precaution  Seizure disorder Continue with antiepileptic drugs .  DVT prophylaxis: Heparin drip Code Status: Full code Family Communication: Mother at bedside Disposition Plan: Pending clinical improvement   Consultants:   Neurosurgery  Infectious disease   Subjective: Mother at bedside states that the patient now has mild diarrhea after being constipated a few days ago.  Patient also appears to want to eat less.  Objective: Vitals:   11/08/19 2013 11/09/19 0028 11/09/19 0410 11/09/19 0952  BP: (!) 148/103 (!) 140/91 (!) 144/87 (!) 150/102  Pulse: 95 91 77 (!) 104  Resp: 19 18 19    Temp: 99.5 F (37.5 C) 99 F (37.2 C) 98.7 F (37.1 C)   TempSrc: Axillary Axillary Axillary   SpO2: 96% 97% 100%   Weight:      Height:        Intake/Output Summary (Last 24 hours) at 11/09/2019 1502 Last data filed at 11/09/2019 0400 Gross per 24 hour  Intake 1922.48 ml  Output 1600 ml  Net 322.48 ml   Filed Weights   10/13/19 2006 10/14/19 0237 10/31/19 2122  Weight: 74.4 kg 74.7 kg 81.3 kg    Examination:  General exam: Laying in bed in no acute distress Respiratory system: Diminished at bases Cardiovascular system: S1 & S2 heard, RRR. No JVD, murmurs, rubs, gallops or clicks. No pedal edema. Gastrointestinal system: Abdomen minimally distended and tender to palpation.  Normal bowel sounds. Central nervous system: Alert to her name only which is at baseline Extremities: Contracted upper extremities Skin: No rashes, lesions or ulcers Psychiatry: Poor judgment and insight  External urinary catheter Right neck triple-lumen catheter 10/27/2019.    LOS: 26 days   Total time spent for this encounter is 25 minutes    Charolotte Capuchin, MD [facility] If 7PM-7AM, please contact night-coverage  11/09/2019, 3:02 PM

## 2019-11-09 NOTE — Progress Notes (Signed)
Physical Therapy Treatment Patient Details Name: Tracey Graham MRN: [id number] DOB: May 27, 1986 Today's Date: 11/09/2019    History of Present Illness Pt is a 33 year old woman admitted 10/14/19 with fever due to PNA vs VP shunt infection and distal colitis. Hospital cource complicated by acute hypoxic respiratory failure, tachycardia, uncontrolled blood pressure, esophagitis and gastric ulcers, L LE DVT and iron deficiency anemia. PMH: Cerebral palsy, intellectual disability, seizures, hydrocephalus, w/c dependent.    PT Comments    Pt is up to sit and propel chair on hall with mother, assisted to ensure pt is comfortable with all movement.  Follow up with her mother and staff to make sure pt is able to return to bed safely, and will further focus PT on her sitting balance control and family ability to move her as pt is going home with HHPT.   Follow Up Recommendations  Home health PT;Supervision for mobility/OOB;Supervision/Assistance - 24 hour     Equipment Recommendations  None recommended by PT    Recommendations for Other Services       Precautions / Restrictions Precautions Precautions: Fall(VP shunt) Restrictions Weight Bearing Restrictions: No    Mobility  Bed Mobility Overal bed mobility: Needs Assistance Bed Mobility: Rolling Rolling: Total assist;+2 for physical assistance;+2 for safety/equipment         General bed mobility comments: rolled for placement of maximove lift pad  Transfers Overall transfer level: Needs assistance               General transfer comment: lifted to her custom w/c   Ambulation/Gait             General Gait Details: unable to walk   Stairs             Wheelchair Mobility    Modified Rankin (Stroke Patients Only)       Balance   Sitting-balance support: Bilateral upper extremity supported;Feet supported Sitting balance-Leahy Scale: Poor       Standing balance-Leahy Scale: Zero                              Cognition Arousal/Alertness: Awake/alert Behavior During Therapy: WFL for tasks assessed/performed Overall Cognitive Status: History of cognitive impairments - at baseline                                 General Comments: pt has limitations of control of movement and active use of LE"s      Exercises      General Comments General comments (skin integrity, edema, etc.): pt was lifted to her chair to plan her tolerance and to educate nursing for follow up and return to bed      Pertinent Vitals/Pain Pain Assessment: Faces Faces Pain Scale: No hurt    Home Living                      Prior Function            PT Goals (current goals can now be found in the care plan section) Acute Rehab PT Goals Patient Stated Goal: per mother to go home Progress towards PT goals: Progressing toward goals    Frequency    Min 3X/week      PT Plan Current plan remains appropriate    Co-evaluation PT/OT/SLP Co-Evaluation/Treatment: Yes Reason for Co-Treatment: For patient/therapist safety PT goals addressed  during session: Mobility/safety with mobility;Proper use of DME OT goals addressed during session: Other (comment)(OOB activity)      AM-PAC PT "6 Clicks" Mobility   Outcome Measure  Help needed turning from your back to your side while in a flat bed without using bedrails?: A Lot Help needed moving from lying on your back to sitting on the side of a flat bed without using bedrails?: A Lot Help needed moving to and from a bed to a chair (including a wheelchair)?: Total Help needed standing up from a chair using your arms (e.g., wheelchair or bedside chair)?: Total Help needed to walk in hospital room?: Total Help needed climbing 3-5 steps with a railing? : Total 6 Click Score: 8    End of Session     Patient left: in chair;with call bell/phone within reach;with family/visitor present Nurse Communication: Mobility status PT  Visit Diagnosis: Unsteadiness on feet (R26.81);Muscle weakness (generalized) (M62.81);Difficulty in walking, not elsewhere classified (R26.2)     Time: 1421-1500 PT Time Calculation (min) (ACUTE ONLY): 39 min  Charges:  $Therapeutic Activity: 38-52 mins                   Tracey Graham 11/09/2019, 6:03 PM   Mee Hives, PT MS Acute Rehab Dept. Number: [place] and [place]

## 2019-11-09 NOTE — Progress Notes (Addendum)
Occupational Therapy Treatment Patient Details Name: Tracey Graham MRN: [id number] DOB: 1986/04/28 Today's Date: 11/09/2019    History of present illness Pt is a 33 year old woman admitted 10/14/19 with fever due to PNA vs VP shunt infection and distal colitis. Hospital cource complicated by acute hypoxic respiratory failure, tachycardia, uncontrolled blood pressure, esophagitis and gastric ulcers, L LE DVT and iron deficiency anemia. PMH: Cerebral palsy, intellectual disability, seizures, hydrocephalus, w/c dependent.   OT comments  Assisted pt to get up to her custom w/c with maximove. Total assist required to roll for placement of lift pad. Pt wheeled through halls with her mom. VSS throughout and no indication of pain or nausea.  Follow Up Recommendations  No OT follow up    Equipment Recommendations  None recommended by OT    Recommendations for Other Services      Precautions / Restrictions Precautions Precautions: Fall VP shunt      Mobility Bed Mobility   Bed Mobility: Rolling Rolling: Total assist         General bed mobility comments: rolled for placement of maximove lift pad  Transfers Overall transfer level: Needs assistance               General transfer comment: lifted to her custom w/c     Balance                                           ADL either performed or assessed with clinical judgement   ADL                                               Vision       Perception     Praxis      Cognition Arousal/Alertness: Awake/alert Behavior During Therapy: WFL for tasks assessed/performed Overall Cognitive Status: History of cognitive impairments - at baseline                                          Exercises     Shoulder Instructions       General Comments      Pertinent Vitals/ Pain       Pain Assessment: Faces Faces Pain Scale: No hurt  Home Living                                           Prior Functioning/Environment              Frequency  Min 2X/week        Progress Toward Goals  OT Goals(current goals can now be found in the care plan section)  Progress towards OT goals: Progressing toward goals  Acute Rehab OT Goals Patient Stated Goal: per mother to go home OT Goal Formulation: With family Time For Goal Achievement: 11/22/19 Potential to Achieve Goals: Good  Plan Discharge plan remains appropriate    Co-evaluation    PT/OT/SLP Co-Evaluation/Treatment: Yes Reason for Co-Treatment: For patient/therapist safety   OT goals addressed during session: Other (comment)(OOB activity)  AM-PAC OT "6 Clicks" Daily Activity     Outcome Measure   Help from another person eating meals?: Total Help from another person taking care of personal grooming?: Total Help from another person toileting, which includes using toliet, bedpan, or urinal?: Total Help from another person bathing (including washing, rinsing, drying)?: Total Help from another person to put on and taking off regular upper body clothing?: Total   6 Click Score: 5    End of Session    OT Visit Diagnosis: Pain;Muscle weakness (generalized) (M62.81);Other symptoms and signs involving cognitive function;Unsteadiness on feet (R26.81)   Activity Tolerance Patient tolerated treatment well   Patient Left Other (comment)(up in w/c)   Nurse Communication Need for lift equipment        Time: 3419-6222 OT Time Calculation (min): 24 min  Charges: OT General Charges $OT Visit: 1 Visit OT Treatments $Therapeutic Activity: 8-22 mins  Nestor Lewandowsky, OTR/L Acute Rehabilitation Services Pager: [phone] Office: [phone]   Malka So 11/09/2019, 3:43 PM

## 2019-11-10 LAB — TSH: TSH: 4.402 u[IU]/mL (ref 0.350–4.500)

## 2019-11-10 LAB — CBC
HCT: 28.7 % — ABNORMAL LOW (ref 36.0–46.0)
Hemoglobin: 9.9 g/dL — ABNORMAL LOW (ref 12.0–15.0)
MCH: 28.3 pg (ref 26.0–34.0)
MCHC: 34.5 g/dL (ref 30.0–36.0)
MCV: 82 fL (ref 80.0–100.0)
Platelets: 322 10*3/uL (ref 150–400)
RBC: 3.5 MIL/uL — ABNORMAL LOW (ref 3.87–5.11)
RDW: 15.9 % — ABNORMAL HIGH (ref 11.5–15.5)
WBC: 8.5 10*3/uL (ref 4.0–10.5)
nRBC: 0 % (ref 0.0–0.2)

## 2019-11-10 LAB — BASIC METABOLIC PANEL
Anion gap: 11 (ref 5–15)
BUN: <5 mg/dL — ABNORMAL LOW (ref 6–20)
CO2: 24 mmol/L (ref 22–32)
Calcium: 8.8 mg/dL — ABNORMAL LOW (ref 8.9–10.3)
Chloride: 104 mmol/L (ref 98–111)
Creatinine, Ser: 0.58 mg/dL (ref 0.44–1.00)
GFR calc Af Amer: >60 mL/min (ref >60)
GFR calc non Af Amer: >60 mL/min (ref >60)
Glucose, Bld: 102 mg/dL — ABNORMAL HIGH (ref 70–99)
Potassium: 3.3 mmol/L — ABNORMAL LOW (ref 3.5–5.1)
Sodium: 139 mmol/L (ref 135–145)

## 2019-11-10 LAB — HEPARIN LEVEL (UNFRACTIONATED): Heparin Unfractionated: 0.54 IU/mL (ref 0.30–0.70)

## 2019-11-10 MED ORDER — POTASSIUM CHLORIDE 10 MEQ/50ML IV SOLN
10.0000 meq | INTRAVENOUS | Status: AC
  Administered 2019-11-10 (×4): 10 meq via INTRAVENOUS
  Filled 2019-11-10 (×4): qty 50

## 2019-11-10 MED ORDER — POTASSIUM CHLORIDE 20 MEQ PO PACK
40.0000 meq | PACK | Freq: Once | ORAL | Status: DC
  Filled 2019-11-10: qty 2

## 2019-11-10 MED ORDER — SIMETHICONE 80 MG PO CHEW
80.0000 mg | CHEWABLE_TABLET | Freq: Four times a day (QID) | ORAL | Status: DC | PRN
  Administered 2019-11-10 – 2019-11-11 (×3): 80 mg via ORAL
  Filled 2019-11-10 (×4): qty 1

## 2019-11-10 NOTE — Progress Notes (Signed)
ANTICOAGULATION CONSULT NOTE - Follow Up Consult  Pharmacy Consult for Heparin Indication: DVT  No Active Allergies  Patient Measurements: Height: 5\' 4"  (162.6 cm) Weight: 179 lb 3.7 oz (81.3 kg) IBW/kg (Calculated) : 54.7 Heparin Dosing Weight: 70kg  Vital Signs: Temp: 98.2 F (36.8 C) (11/14 0811) Temp Source: Axillary (11/14 0811) BP: 142/96 (11/14 0811) Pulse Rate: 100 (11/14 0811)  Labs: Recent Labs    11/08/19 0406 11/08/19 0641 11/08/19 0900 11/09/19 0433 11/09/19 0434 11/10/19 0500  HGB 9.7*  --   --  9.5*  --  9.9*  HCT 28.0*  --   --  27.2*  --  28.7*  PLT 318  --   --  294  --  322  HEPARINUNFRC  --  0.33  --   --  0.68 0.54  CREATININE  --   --  0.59 0.52  --  0.58    Estimated Creatinine Clearance: 103.1 mL/min (by C-G formula based on SCr of 0.58 mg/dL).  Assessment: 33 year old female with recent DVT, Eliquis on hold, last dose 10/31. Patient continues on IV heparin.  Heparin level therapeutic this morning. CBC stable, no signs or symptoms of bleed, no issues with infusion reported.  Goal of Therapy:  Heparin level 0.3-0.7 units/ml Monitor platelets by anticoagulation protocol: Yes   Plan:  Continue heparin IV 800 units/hour Continue to monitor H&H and platelets  Monitor for signs and symptoms of bleed     Thank you for allowing pharmacy to participate in this patient's care.  Delphia Kaylor L. Devin Going, [facility] PGY1 Pharmacy Resident [phone] 11/10/19      9:16 AM  Please check AMION for all [place] phone numbers After 10:00 PM, call the [place] [phone]

## 2019-11-10 NOTE — Progress Notes (Signed)
PROGRESS NOTE    Tracey Graham  JGG:[id number] DOB: 27-Jul-1986 DOA: 10/13/2019 PCP: Clinic, General Medical   Brief Narrative:  33 year old with history of cerebral palsy, wheelchair-bound/fully dependent on ADL S, VP shunt for hydrocephalus, seizure disorder, intellectual disability came to the ER with complaints of fever abdominal pain.  Initial SIRS, COVID-19-negative.  CT of the abdomen pelvis was overall negative.  CT head-negative.  Work-up showed left lower extremity acute DVT started on anticoagulation.  LP initially performed 10/21 which showed pleocytosis, elevated protein and lymphocytic predominance without any bacteria.  Initial thoughts were DVT causing her fevers.  Aseptic meningitis-treated with 3 days of Decadron.  Course further complicated by abdominal pain with GI consultation.  EGD on 10/29 showed esophagitis and gastric ulcers for which GI recommended PPI twice daily.  No biopsies were taken as patient was on Eliquis.  Repeat CT of the abdomen pelvis with contrast showed loculated fluid collection at the tip of the VP shunt raising concerns for VP shunt infection.  Neurosurgery consulted, repeat LP performed on 10/30/2019.   Patient is currently on antibiotics, Vanco, cefepime and metronidazole  Repeat CT abdomen and pelvis with contrast done on 11/03/2019 showed identification of intra-abdominal shunt with 1 position in the lower right hemiabdomen and second portion in the upper right hemiabdomen anterior to cecum.  The fluid collection around the upper hemiabdominal catheter has resolved.  Small volume ascites in the abdomen and pelvis.  Thickened, inflamed appearance of the distal colon and rectum which has improved consistent with resolving infection inflammatory colitis.  Assessment & Plan:   Principal Problem:   Infection of VP (ventriculoperitoneal) shunt (HCC) Active Problems:   Hydrocephalus (HCC)   VP (ventriculoperitoneal) shunt status   Encephalopathy  Abdominal pain   Nuchal rigidity   Aseptic meningitis   Constipation   Deep vein thrombosis (DVT) of femoral vein of left lower extremity (HCC)   AMS (altered mental status)  Fever likely secondary to multifocal pneumonia, possible VP shunt infection and distal colitis - father at bedside says she is slowly looking better - Infection possibly at infected shunt..  Cultures remain negative, GI panel negative.  CT of the abdomen pelvis with contrast on 10/30 showed loculated fluid around tip of VP shunt in the left anterior pelvis.    -Initial LP 10/17/2019-unrevealing.                                                         . -Repeat CT of the abdomen pelvis 11/03/2019 showed improvement in fluid collection and infectious/inflammatory colitis likely from antibiotic therapy. -Neurosurgery on board; appreciate their recs  -Continue antibiotics as below, per ID  through 11/10/2019. Then stop   Acute hypoxic respiratory failure secondary to multifocal pneumonia; improving -Chest x-ray showed bilateral pulmonary infiltrates at the bases. -Currently on IV vancomycin, cefepime, Flagyl.  Breathing has remained stable and not on O2. -Infectious disease team following. -Aggressive bronchodilators.  Incentive spirometer and flutter valve if she uses it.  Abdominal discomfort, nonspecific:  - Much improved  -Abdominal x-ray showed ileus versus possible small bowel obstruction.  Esophagitis and gastric ulcers could also be playing a role as well as constipation - given bowel regimen; no more needed as she has loose stool recently  -Repeat CT abdomen and pelvis on 11/03/2019 shows significant improvement in fluid  collection as well as inflammation in the distal colon and rectum  - Monitor   Persistent sinus tachycardia with uncontrolled elevated blood pressure.  - Possibly inappropriate sinus tachycardia versus ongoing infection. - 100's  - Check TSH  - May up-titrate the lopressor dose  - May  consider ivabradine if infection is ruled out and heart rate is not controlled beta-blocker or calcium channel blocker   Esophagitis and nonbleeding gastric ulcers Continue with Protonix twice daily Continue with Carafate 4 times daily.  Acute left lower extremity DVT -Continue anticoagulation.  Iron deficiency anemia -Iron supplements.  Bowel regimen.  Dysphagia -Aspiration precaution  Seizure disorder Continue with antiepileptic drugs .  DVT prophylaxis: Heparin drip Code Status: Full code Family Communication: Mother at bedside Disposition Plan: Pending clinical improvement   Consultants:   Neurosurgery  Infectious disease   Subjective: Mother at bedside states that the patient now has mild diarrhea after being constipated a few days ago.  Patient also appears to want to eat less.  Objective: Vitals:   11/09/19 2339 11/10/19 0454 11/10/19 0811 11/10/19 1152  BP: 106/79 (!) 141/82 (!) 142/96 (!) 135/97  Pulse: 97 99 100 98  Resp: 17 17 16 20   Temp: 99 F (37.2 C) 99.1 F (37.3 C) 98.2 F (36.8 C) 98.8 F (37.1 C)  TempSrc: Axillary Axillary Axillary Axillary  SpO2: 96% 97% 97% 95%  Weight:      Height:        Intake/Output Summary (Last 24 hours) at 11/10/2019 1249 Last data filed at 11/10/2019 11/12/2019 Gross per 24 hour  Intake 2213.22 ml  Output 1900 ml  Net 313.22 ml   Filed Weights   10/13/19 2006 10/14/19 0237 10/31/19 2122  Weight: 74.4 kg 74.7 kg 81.3 kg    Examination:  General exam: Laying in bed in no acute distress Respiratory system: Diminished at bases Cardiovascular system: S1 & S2 heard, RRR. No JVD, murmurs, rubs, gallops or clicks. No pedal edema. Gastrointestinal system: Abdomen minimally distended and tender to palpation.  Normal bowel sounds. Central nervous system: Alert to her name only which is at baseline Extremities: Contracted upper extremities Skin: No rashes, lesions or ulcers Psychiatry: Poor judgment and insight   External urinary catheter Right neck triple-lumen catheter 10/27/2019.    LOS: 27 days   Total time spent for this encounter is 25 minutes    10/29/2019, MD [facility] If 7PM-7AM, please contact night-coverage  11/10/2019, 12:49 PM

## 2019-11-10 NOTE — Progress Notes (Signed)
ANTICOAGULATION CONSULT NOTE - Follow Up Consult  Pharmacy Consult for heparin Indication: DVT  No Active Allergies  Patient Measurements: Height: 5\' 4"  (162.6 cm) Weight: 179 lb 3.7 oz (81.3 kg) IBW/kg (Calculated) : 54.7 Heparin Dosing Weight:70 kg  Vital Signs: Temp: 98.2 F (36.8 C) (11/14 0811) Temp Source: Axillary (11/14 0811) BP: 142/96 (11/14 0811) Pulse Rate: 100 (11/14 0811)  Labs: Recent Labs    11/08/19 0406 11/08/19 0641 11/08/19 0900 11/09/19 0433 11/09/19 0434 11/10/19 0500  HGB 9.7*  --   --  9.5*  --  9.9*  HCT 28.0*  --   --  27.2*  --  28.7*  PLT 318  --   --  294  --  322  HEPARINUNFRC  --  0.33  --   --  0.68 0.54  CREATININE  --   --  0.59 0.52  --  0.58    Estimated Creatinine Clearance: 103.1 mL/min (by C-G formula based on SCr of 0.58 mg/dL).   Medications:  Infusions:  . ceFEPime (MAXIPIME) IV 2 g (11/10/19 0340)  . dextrose 5 % and 0.9% NaCl 50 mL/hr at 11/09/19 1712  . heparin 800 Units/hr (11/10/19 0342)  . metronidazole 500 mg (11/10/19 0116)  . vancomycin 750 mg (11/10/19 0342)    Assessment: 62 yof w/ DVT previously treated with lovenox, but now on heparin d/t procedures. No anticoagulation noted PTA. Heparin level this AM is therapeutic at 0.54 s/p rate decrease to 800 units/hour. Her H&H is stable at 9.9/28.7, plts are wnl.    Goal of Therapy:  Heparin level 0.3-0.7 units/ml Monitor platelets by anticoagulation protocol: Yes   Plan:  Continue heparin infusion at 800 units/hr Check anti-Xa level and CBC daily while on heparin Continue to monitor H&H and platelets Monitor for signs of bleeding Follow up when transitioning to PO anticoagulation  Thank you,   Eddie Candle, PharmD PGY-1 Pharmacy Resident   Please check amion for clinical pharmacist contact number   11/10/2019,9:12 AM

## 2019-11-11 LAB — CBC
HCT: 28.8 % — ABNORMAL LOW (ref 36.0–46.0)
Hemoglobin: 10.1 g/dL — ABNORMAL LOW (ref 12.0–15.0)
MCH: 28.9 pg (ref 26.0–34.0)
MCHC: 35.1 g/dL (ref 30.0–36.0)
MCV: 82.3 fL (ref 80.0–100.0)
Platelets: 301 10*3/uL (ref 150–400)
RBC: 3.5 MIL/uL — ABNORMAL LOW (ref 3.87–5.11)
RDW: 15.9 % — ABNORMAL HIGH (ref 11.5–15.5)
WBC: 8.6 10*3/uL (ref 4.0–10.5)
nRBC: 0 % (ref 0.0–0.2)

## 2019-11-11 LAB — BASIC METABOLIC PANEL
Anion gap: 10 (ref 5–15)
BUN: <5 mg/dL — ABNORMAL LOW (ref 6–20)
CO2: 23 mmol/L (ref 22–32)
Calcium: 9 mg/dL (ref 8.9–10.3)
Chloride: 107 mmol/L (ref 98–111)
Creatinine, Ser: 0.65 mg/dL (ref 0.44–1.00)
GFR calc Af Amer: >60 mL/min (ref >60)
GFR calc non Af Amer: >60 mL/min (ref >60)
Glucose, Bld: 105 mg/dL — ABNORMAL HIGH (ref 70–99)
Potassium: 3.7 mmol/L (ref 3.5–5.1)
Sodium: 140 mmol/L (ref 135–145)

## 2019-11-11 LAB — HEPARIN LEVEL (UNFRACTIONATED): Heparin Unfractionated: 0.53 IU/mL (ref 0.30–0.70)

## 2019-11-11 NOTE — Progress Notes (Signed)
PROGRESS NOTE    Tracey Graham  OVF:[id number] DOB: 13-Jan-1986 DOA: 10/13/2019 PCP: Clinic, General Medical   Brief Narrative:  33 year old with history of cerebral palsy, wheelchair-bound/fully dependent on ADL S, VP shunt for hydrocephalus, seizure disorder, intellectual disability came to the ER with complaints of fever abdominal pain.  Initial SIRS, COVID-19-negative.  CT of the abdomen pelvis was overall negative.  CT head-negative.  Work-up showed left lower extremity acute DVT started on anticoagulation.  LP initially performed 10/21 which showed pleocytosis, elevated protein and lymphocytic predominance without any bacteria.  Initial thoughts were DVT causing her fevers.  Aseptic meningitis-treated with 3 days of Decadron.  Course further complicated by abdominal pain with GI consultation.  EGD on 10/29 showed esophagitis and gastric ulcers for which GI recommended PPI twice daily.  No biopsies were taken as patient was on Eliquis.  Repeat CT of the abdomen pelvis with contrast showed loculated fluid collection at the tip of the VP shunt raising concerns for VP shunt infection.  Neurosurgery consulted, repeat LP performed on 10/30/2019.   Patient is currently on antibiotics, Vanco, cefepime and metronidazole  Repeat CT abdomen and pelvis with contrast done on 11/03/2019 showed identification of intra-abdominal shunt with 1 position in the lower right hemiabdomen and second portion in the upper right hemiabdomen anterior to cecum.  The fluid collection around the upper hemiabdominal catheter has resolved.  Small volume ascites in the abdomen and pelvis.  Thickened, inflamed appearance of the distal colon and rectum which has improved consistent with resolving infection inflammatory colitis.  Assessment & Plan:   Principal Problem:   Infection of VP (ventriculoperitoneal) shunt (HCC) Active Problems:   Hydrocephalus (HCC)   VP (ventriculoperitoneal) shunt status   Encephalopathy  Abdominal pain   Nuchal rigidity   Aseptic meningitis   Constipation   Deep vein thrombosis (DVT) of femoral vein of left lower extremity (HCC)   AMS (altered mental status)  Fever likely secondary to multifocal pneumonia, possible VP shunt infection and distal colitis - Infection possibly at the VP shunt..  Cultures remain negative, GI panel negative.  CT of the abdomen pelvis with contrast on 10/30 showed loculated fluid around tip of VP shunt in the left anterior pelvis.    -Initial LP 10/17/2019-unrevealing.                                                         . -Repeat CT of the abdomen pelvis 11/03/2019 showed improvement in fluid collection and infectious/inflammatory colitis likely from antibiotic therapy. -Neurosurgery on board; appreciate their recs  - course of antibiotics completed, per ID  On 11/10/2019  - Speak with NeuroSurg for their D/C recs   Acute hypoxic respiratory failure secondary to multifocal pneumonia; improved  - On admission Chest x-ray showed bilateral pulmonary infiltrates at the bases. - Breathing has remained stable and not on O2. -Aggressive bronchodilators.  Incentive spirometer and flutter valve if she uses it.  Abdominal discomfort, nonspecific:  - Much improved/ resolved  - Earlier Abdominal x-ray showed ileus versus possible small bowel obstruction.  Esophagitis and gastric ulcers could also be playing a role as well as constipation - given bowel regimen; no more needed as she has loose stool recently  -Repeat CT abdomen and pelvis on 11/03/2019 shows significant improvement in fluid collection as well as  inflammation in the distal colon and rectum  - Monitor   Persistent sinus tachycardia with uncontrolled elevated blood pressure.  - Possibly inappropriate sinus tachycardia versus ongoing infection. - 100's  - Check TSH --> WNL  - May up-titrate the lopressor dose  - May consider ivabradine if infection is ruled out and heart rate is not  controlled beta-blocker or calcium channel blocker   Esophagitis and nonbleeding gastric ulcers Continue with Protonix twice daily Continue with Carafate 4 times daily.  Acute left lower extremity DVT -Continue anticoagulation.  Iron deficiency anemia -Iron supplements.  Bowel regimen.  Dysphagia -Aspiration precaution  Seizure disorder Continue with antiepileptic drugs .  DVT prophylaxis: Heparin drip Code Status: Full code Family Communication: Mother at bedside Disposition Plan: Pending clinical improvement. PT/OT recs    Consultants:   Neurosurgery  Infectious disease   Subjective: Pt is alert. Mother at bedside. Tries to answer. No acute isseus.   Objective: Vitals:   11/11/19 0802 11/11/19 1007 11/11/19 1215 11/11/19 1306  BP: (!) 149/84  (!) 119/106 (!) 151/95  Pulse: (!) 107  100   Resp: 14 15 17 18   Temp: 98.1 F (36.7 C)  98.6 F (37 C)   TempSrc: Oral  Oral   SpO2: 97%  95% 100%  Weight:      Height:        Intake/Output Summary (Last 24 hours) at 11/11/2019 1529 Last data filed at 11/11/2019 0900 Gross per 24 hour  Intake 2569.95 ml  Output 2200 ml  Net 369.95 ml   Filed Weights   10/13/19 2006 10/14/19 0237 10/31/19 2122  Weight: 74.4 kg 74.7 kg 81.3 kg    Examination:  General exam: Laying in bed in no acute distress Respiratory system: Diminished at bases Cardiovascular system: S1 & S2 heard, RRR. No JVD, murmurs, rubs, gallops or clicks. No pedal edema. Gastrointestinal system: Abdomen minimally distended and tender to palpation.  Normal bowel sounds. Central nervous system: Alert to her name only which is at baseline Extremities: Contracted upper extremities Skin: No rashes, lesions or ulcers Psychiatry: Poor judgment and insight  External urinary catheter Right neck triple-lumen catheter 10/27/2019.    LOS: 28 days   Total time spent for this encounter is 25 minutes    10/29/2019, MD [facility] If 7PM-7AM,  please contact night-coverage  11/11/2019, 3:29 PM

## 2019-11-11 NOTE — Progress Notes (Signed)
ANTICOAGULATION CONSULT NOTE - Follow Up Consult  Pharmacy Consult for heparin Indication: DVT  No Active Allergies  Patient Measurements: Height: 5\' 4"  (162.6 cm) Weight: 179 lb 3.7 oz (81.3 kg) IBW/kg (Calculated) : 54.7 Heparin Dosing Weight:70 kg  Vital Signs: Temp: 98.4 F (36.9 C) (11/15 0350) Temp Source: Axillary (11/15 0350) BP: 141/78 (11/15 0350)  Labs: Recent Labs    11/09/19 0433 11/09/19 0434 11/10/19 0500 11/11/19 0359  HGB 9.5*  --  9.9* 10.1*  HCT 27.2*  --  28.7* 28.8*  PLT 294  --  322 301  HEPARINUNFRC  --  0.68 0.54 0.53  CREATININE 0.52  --  0.58 0.65    Estimated Creatinine Clearance: 103.1 mL/min (by C-G formula based on SCr of 0.65 mg/dL).   Medications:  Infusions:  . dextrose 5 % and 0.9% NaCl 50 mL/hr at 11/10/19 2045  . heparin 800 Units/hr (11/10/19 0342)    Assessment: 68 yof w/ DVT previously treated with lovenox, but now on heparin d/t procedures. No anticoagulation noted PTA. Heparin level remains therapeutic at 0.53 on 800 units/hr. Her H&H is stable at 10.1/28.8 and plts are wnl at 301.   Goal of Therapy:  Heparin level 0.3-0.7 units/ml Monitor platelets by anticoagulation protocol: Yes   Plan:  Continue heparin infusion at 800 units/hr Check anti-Xa level and CBC daily while on heparin Continue to monitor H&H and platelets Monitor for signs of bleeding Follow up when transitioning to PO anticoagulation  Thank you,   Eddie Candle, PharmD PGY-1 Pharmacy Resident   Please check amion for clinical pharmacist contact number   11/11/2019,7:30 AM

## 2019-11-12 DIAGNOSIS — R000 Tachycardia, unspecified: Secondary | ICD-10-CM

## 2019-11-12 DIAGNOSIS — K259 Gastric ulcer, unspecified as acute or chronic, without hemorrhage or perforation: Secondary | ICD-10-CM

## 2019-11-12 LAB — CBC
HCT: 30.4 % — ABNORMAL LOW (ref 36.0–46.0)
Hemoglobin: 10.5 g/dL — ABNORMAL LOW (ref 12.0–15.0)
MCH: 28.5 pg (ref 26.0–34.0)
MCHC: 34.5 g/dL (ref 30.0–36.0)
MCV: 82.4 fL (ref 80.0–100.0)
Platelets: 310 10*3/uL (ref 150–400)
RBC: 3.69 MIL/uL — ABNORMAL LOW (ref 3.87–5.11)
RDW: 15.9 % — ABNORMAL HIGH (ref 11.5–15.5)
WBC: 8.3 10*3/uL (ref 4.0–10.5)
nRBC: 0 % (ref 0.0–0.2)

## 2019-11-12 LAB — HEPARIN LEVEL (UNFRACTIONATED): Heparin Unfractionated: 0.39 IU/mL (ref 0.30–0.70)

## 2019-11-12 LAB — BASIC METABOLIC PANEL
Anion gap: 9 (ref 5–15)
BUN: <5 mg/dL — ABNORMAL LOW (ref 6–20)
CO2: 25 mmol/L (ref 22–32)
Calcium: 8.9 mg/dL (ref 8.9–10.3)
Chloride: 104 mmol/L (ref 98–111)
Creatinine, Ser: 0.53 mg/dL (ref 0.44–1.00)
GFR calc Af Amer: >60 mL/min (ref >60)
GFR calc non Af Amer: >60 mL/min (ref >60)
Glucose, Bld: 99 mg/dL (ref 70–99)
Potassium: 3.9 mmol/L (ref 3.5–5.1)
Sodium: 138 mmol/L (ref 135–145)

## 2019-11-12 MED ORDER — ENSURE HIGH PROTEIN PO LIQD: 1.0000 | Freq: Two times a day (BID) | ORAL | 1 refills | Status: DC

## 2019-11-12 MED ORDER — PANTOPRAZOLE SODIUM 40 MG PO TBEC: 40.0000 mg | DELAYED_RELEASE_TABLET | Freq: Two times a day (BID) | ORAL | 0 refills | Status: DC

## 2019-11-12 MED ORDER — METOPROLOL TARTRATE 25 MG PO TABS: 12.5000 mg | ORAL_TABLET | Freq: Two times a day (BID) | ORAL | 0 refills | Status: DC

## 2019-11-12 MED ORDER — APIXABAN 5 MG PO TABS
5.0000 mg | ORAL_TABLET | Freq: Once | ORAL | Status: AC
  Administered 2019-11-12: 5 mg via ORAL
  Filled 2019-11-12: qty 1

## 2019-11-12 MED ORDER — PANTOPRAZOLE SODIUM 40 MG PO TBEC: 40.0000 mg | DELAYED_RELEASE_TABLET | Freq: Every day | ORAL | 2 refills | Status: DC

## 2019-11-12 MED ORDER — OMEPRAZOLE 20 MG PO CPDR: 20.0000 mg | DELAYED_RELEASE_CAPSULE | Freq: Two times a day (BID) | ORAL | 0 refills | Status: DC

## 2019-11-12 NOTE — Plan of Care (Signed)
  Problem: Education: Goal: Knowledge of General Education information will improve Description: Including pain rating scale, medication(s)/side effects and non-pharmacologic comfort measures Outcome: Completed/Met   Problem: Health Behavior/Discharge Planning: Goal: Ability to manage health-related needs will improve Outcome: Completed/Met   Problem: Clinical Measurements: Goal: Ability to maintain clinical measurements within normal limits will improve Outcome: Completed/Met Goal: Will remain free from infection Outcome: Completed/Met Goal: Diagnostic test results will improve Outcome: Completed/Met Goal: Respiratory complications will improve Outcome: Completed/Met Goal: Cardiovascular complication will be avoided Outcome: Completed/Met   Problem: Activity: Goal: Risk for activity intolerance will decrease Outcome: Completed/Met   Problem: Nutrition: Goal: Adequate nutrition will be maintained Outcome: Completed/Met   Problem: Coping: Goal: Level of anxiety will decrease Outcome: Completed/Met   Problem: Elimination: Goal: Will not experience complications related to bowel motility Outcome: Completed/Met Goal: Will not experience complications related to urinary retention Outcome: Completed/Met   Problem: Pain Managment: Goal: General experience of comfort will improve Outcome: Completed/Met   Problem: Safety: Goal: Ability to remain free from injury will improve Outcome: Completed/Met   Problem: Skin Integrity: Goal: Risk for impaired skin integrity will decrease Outcome: Completed/Met   Problem: Acute Rehab OT Goals (only OT should resolve) Goal: Pt/Caregiver Will Perform Home Exercise Program Outcome: Completed/Met Goal: OT Additional ADL Goal #1 Outcome: Completed/Met Goal: OT Additional ADL Goal #2 Outcome: Completed/Met   Problem: Acute Rehab PT Goals(only PT should resolve) Goal: Pt Will Go Supine/Side To Sit Outcome: Completed/Met Goal: Patient  Will Perform Sitting Balance Outcome: Completed/Met Goal: Patient Will Transfer Sit To/From Stand Outcome: Completed/Met Goal: Pt Will Transfer Bed To Chair/Chair To Bed Outcome: Completed/Met Goal: Pt Will Perform Standing Balance Or Pre-Gait Outcome: Completed/Met

## 2019-11-12 NOTE — TOC Transition Note (Signed)
Transition of Care [facility]) - CM/SW Discharge Note [name] RN, BSN Transitions of Care Unit 4E- RN Case Manager [phone]   Patient Details  Name: Tracey Graham MRN: [id number] Date of Birth: Apr 03, 1986  [facility] of Care [facility]) CM/SW Contact:  Dawayne Patricia, RN Phone Number: 11/12/2019, 11:37 AM   Clinical Narrative:    Pt stable for transition home, pt lives with mom, has needed DME at home, w/c at bedside- mom to transport home. Spoke with mom at the bedside regarding HHPT recommendations- per mom pt currently gets [facility] aide services through [facility] staff 12noon-7:30pm daily. Mom is hesitant to have anyone else come into the home with current COVID conditions in the community- at this time is going to decline referral for [facility] services- list provided Per CMS guidelines from medicare.gov website with star ratings (copy placed in shadow chart)- should she change her mind she can f/u with pt's PCP for orders and referral. Pt has been started on Eliquis- TOC pharmacy has delivered first month (free) to the bedside- discussed coverage under Medicaid with mom- preferred drug with $3.40 copay cost.  No further questions or transition needs noted per mom.    Final next level of care: Home/Self Care Barriers to Discharge: No Barriers Identified   Patient Goals and CMS Choice Patient states their goals for this hospitalization and ongoing recovery are:: to return home CMS Medicare.gov Compare Post Acute Care list provided to:: Patient Represenative (must comment)(mom) Choice offered to / list presented to : Parent  Discharge Placement            Home/ with mom            Discharge Plan and Services   Discharge Planning Services: CM Consult Post Acute Care Choice: Home Health          DME Arranged: N/A DME Agency: NA       HH Arranged: PT, Patient Refused HH          Social Determinants of Health (SDOH) Interventions     Readmission Risk  Interventions Readmission Risk Prevention Plan 11/12/2019  Post Dischage Appt Complete  Medication Screening Complete  Transportation Screening Complete  Some recent data might be hidden

## 2019-11-12 NOTE — Progress Notes (Signed)
Patient in a stable condition, discharged education reviewed with patient's mother she verbalized understanding, central line removed as ordered, patient's belongings at bedside, tele dc ccmd notified, patient to be transported home by her parents.

## 2019-11-12 NOTE — Progress Notes (Signed)
ANTICOAGULATION CONSULT NOTE - Follow Up Consult  Pharmacy Consult for heparin Indication: DVT  No Active Allergies  Patient Measurements: Height: 5\' 4"  (162.6 cm) Weight: 179 lb 3.7 oz (81.3 kg) IBW/kg (Calculated) : 54.7 Heparin Dosing Weight:70 kg  Vital Signs: Temp: 97.9 F (36.6 C) (11/16 0752) Temp Source: Oral (11/16 0752) BP: 135/81 (11/16 0752) Pulse Rate: 101 (11/16 0752)  Labs: Recent Labs    11/10/19 0500 11/11/19 0359 11/12/19 0500  HGB 9.9* 10.1* 10.5*  HCT 28.7* 28.8* 30.4*  PLT 322 301 310  HEPARINUNFRC 0.54 0.53 0.39  CREATININE 0.58 0.65 0.53    Estimated Creatinine Clearance: 103.1 mL/min (by C-G formula based on SCr of 0.53 mg/dL).   Medications:  Infusions:  . dextrose 5 % and 0.9% NaCl 50 mL/hr at 11/11/19 1735  . heparin 800 Units/hr (11/11/19 1124)    Assessment: 15 yof w/ DVT previously treated with lovenox, but now on heparin d/t procedures. No anticoagulation noted PTA.   Heparin level remains therapeutic at 0.39 on 800 units/hr. Her cbc has remained stable. No bleeding issues noted.   Goal of Therapy:  Heparin level 0.3-0.7 units/ml Monitor platelets by anticoagulation protocol: Yes   Plan:  Continue heparin infusion at 800 units/hr Check anti-Xa level and CBC daily while on heparin Follow up when transitioning to PO anticoagulation  Thank you,  Erin Hearing PharmD., BCPS Clinical Pharmacist 11/12/2019 9:56 AM  Please check amion for clinical pharmacist contact number

## 2019-11-12 NOTE — Discharge Summary (Addendum)
Physician Discharge Summary  Tracey Graham EXH:[id number] DOB: 1986/04/16 DOA: 10/13/2019  PCP: [facility]  Admit date: 10/13/2019 Discharge date: 11/12/2019 Consultations: GI, neurosurgery, Infectious diseases Admitted From: home Disposition: home with PT   Discharge Diagnoses:  Principal Problem:   Infection of VP (ventriculoperitoneal) shunt ([place]) Active Problems:   Hydrocephalus ([place])   VP (ventriculoperitoneal) shunt status   Encephalopathy   Abdominal pain   Nuchal rigidity   Aseptic meningitis   Constipation   Deep vein thrombosis (DVT) of femoral vein of left lower extremity (HCC)   AMS (altered mental status)   Sinus tachycardia   Multiple gastric ulcers   Hospital Course Summary: 33 year old with history of cerebral palsy, wheelchair-bound, s/p VP shunt for hydrocephalus, seizure disorder brought to the ER on 10/17 with fever/SIRS and abdominal pain. Her only home medication is phenobarbital but has not had any seizures for many years.  ED course: BP 99/85, pulse 114, RR 13, temp 101.8 F, SPO2 95% RA. Labs--WBC 11.4, hemoglobin 15.4, sodium 137, potassium 4.0, bicarb 22, BUN 5, creatinine 0.61, LFTs within normal limits, lactic acid 1.1.U/A was wnl. CXR unremarkable. CT head-stable shunt, CT abd/pelvis WO contrast-negative. Patient was given multiple rounds of IV morphine for pain and 1 L normal saline.  Hospital course: Work-up showed left lower extremity acute DVT, patient started on anticoagulation with Eliquis--transitioned to Heparin while undergoing procedures. LP initially performed 10/21 which showed pleocytosis, elevated protein and lymphocytic predominance without any bacteria. Patient treated with 3 days of Decadron for Aseptic meningitis. Course further complicated by worsening abdominal pain prompting GI consultation. EGD on 10/29 showed esophagitis and gastric ulcers for which GI recommended PPI twice daily and repeat EGD in 3 months. No  biopsies were taken as patient was on anticoagulation. Repeat CT of the abdomen pelvis with contrast on 10/30 showed loculated fluid collection at the tip of the VP shunt raising concerns for VP shunt infection. ID/Neurosurgery consulted, repeat LP performed on 10/31/2019- CSF shows 61W 1R TP80 G64, 92% lymphs.Patient started on Vanco, cefepime and metronidazole.  Repeat CT of the abdomen pelvis 11/03/2019 showed resolution of abdominal catheter pseudocyst, resolution of colitis adjacent to shunt catheter- inflammatory colitis which was felt to be likely to be reactive or antibiotic induced. NS deferred antibiotic regimen to ID and did not feel any acute surgical interventions needed.  Patient remained afebrile for 1 week and abx stopped on 11/14 per ID recommendations. Patient per record had transient hypoxia during the hospital course with serial CXRs showing baselar infiltrates possibly atelectasis. Currently on RA and saturating well. Patient been receiving low dose metoprolol for sinus tachycardia with HR going up to 130s , per mother she has baseline tachycardia with HR around 110s --Gets worse when she is excited or has infections. Will taper dose to 12.15m bid on discharge and advised to f/u PCP regarding further taper. TSH on 11/1 at 1.057 and on 11/14 at 4.4. Recommend repeat level when acute issues resolve.   Seen by PT and recommended Home PT, 24 hr supervision. Referral placed and patient advised to follow up as outpatient. Mother at bedside this morning and eager to take patient home. Patient is cheerful and excited to go home.   Discharge Exam:  Vitals:   11/12/19 0752 11/12/19 0755  BP: 135/81   Pulse: (!) 101   Resp: (!) 23 19  Temp: 97.9 F (36.6 C)   SpO2: 95%    Vitals:   11/11/19 2320 11/12/19 0445 11/12/19 0752 11/12/19 0755  BP: 114/82 (!) 135/95 135/81   Pulse: 72 (!) 107 (!) 101   Resp: 20 20 (!) 23 19  Temp: 98.9 F (37.2 C) 99.3 F (37.4 C) 97.9 F (36.6 C)   TempSrc:  Axillary Oral Oral   SpO2: 99% 96% 95%   Weight:      Height:        General: Pt is alert, awake, not in acute distress. Central line along right neck (removed prior to discharge) Cardiovascular: RRR, S1/S2 +, no rubs, no gallops Respiratory: CTA bilaterally, no wheezing, no rhonchi Abdominal: Soft, NT, ND, bowel sounds + Extremities: no edema, no cyanosis  Discharge Condition:Stable CODE STATUS: Full code Diet recommendation: low salt diet Recommendations for Outpatient Follow-up:  1. Follow up with PCP: 1 week 2. Follow up with consultants: Neurosurgery in 3 weeks, Gi Dr Rush [facility]mark in 2 months 3. Please obtain follow up labs including: CBC/BMP/TSH  Home Health services upon discharge: [facility] PT/OT Equipment/Devices upon discharge: None   Discharge Instructions:  Discharge Instructions    Call MD for:  difficulty breathing, headache or visual disturbances   Complete by: As directed    Call MD for:  persistant dizziness or light-headedness   Complete by: As directed    Call MD for:  persistant dizziness or light-headedness   Complete by: As directed    Call MD for:  persistant nausea and vomiting   Complete by: As directed    Call MD for:  persistant nausea and vomiting   Complete by: As directed    Call MD for:  severe uncontrolled pain   Complete by: As directed    Call MD for:  severe uncontrolled pain   Complete by: As directed    Call MD for:  temperature >100.4   Complete by: As directed    Call MD for:  temperature >100.4   Complete by: As directed    Diet - low sodium heart healthy   Complete by: As directed    Increase activity slowly   Complete by: As directed    Increase activity slowly   Complete by: As directed      Allergies as of 11/12/2019   No Active Allergies     Medication List    STOP taking these medications   cetirizine 1 MG/ML syrup Commonly known as: ZYRTEC   erythromycin ophthalmic ointment   guaiFENesin 100 MG/5ML liquid Commonly  known as: ROBITUSSIN   ibuprofen 100 MG/5ML suspension Commonly known as: ADVIL   sodium chloride 0.65 % Soln nasal spray Commonly known as: OCEAN     TAKE these medications   acetaminophen 160 MG/5ML liquid Commonly known as: TYLENOL Take 500 mg by mouth every 4 (four) hours as needed for fever.   apixaban 5 MG Tabs tablet Commonly known as: Eliquis Take 2 tablets (19m) twice daily for 7 days, then 1 tablet (550m twice daily   diphenhydrAMINE 12.5 MG/5ML syrup Commonly known as: BENYLIN Take 10 mLs (25 mg total) by mouth every 4 (four) hours as needed for allergies.   docusate 50 MG/5ML liquid Commonly known as: COLACE Take 10 mLs (100 mg total) by mouth daily as needed for mild constipation or moderate constipation.   fluticasone 50 MCG/ACT nasal spray Commonly known as: FLONASE Place 2 sprays into both nostrils daily.   metoprolol tartrate 25 MG tablet Commonly known as: LOPRESSOR Take 0.5 tablets (12.5 mg total) by mouth 2 (two) times daily. Hold HR <80   omeprazole 20 MG capsule Commonly known  as: PRILOSEC Take 1 capsule (20 mg total) by mouth 2 (two) times daily before a meal. Open capsule and gently mix granules in acidic fruit juice.*Pour the mixture down the tube, flush tube with additional juice. Clamp tube for at least 1 hour   PHENobarbital 97.2 MG tablet Commonly known as: LUMINAL Take 97.2 mg by mouth at bedtime.   polyethylene glycol 17 g packet Commonly known as: MiraLax Take 17 g by mouth daily.      Follow-up Information    [facility]. Call in 1 day(s).   Specialty: Family Medicine Why: please all for a post hospital follow up appointment. Contact information: [place] [address] [phone]        Carlyle Basques, MD. Call in 1 day(s).   Specialty: Infectious Diseases Why: please call for a post hospital follow up appointment. Contact information: [place]   [address] [phone]          No Active Allergies    The results of significant diagnostics from this hospitalization (including imaging, microbiology, ancillary and laboratory) are listed below for reference.    Labs: BNP (last 3 results) No results for input(s): BNP in the last 8760 hours. Basic Metabolic Panel: Recent Labs  Lab 11/08/19 0900 11/09/19 0433 11/10/19 0500 11/11/19 0359 11/12/19 0500  NA 139 138 139 140 138  K 3.5 3.5 3.3* 3.7 3.9  CL 107 107 104 107 104  CO2 24 24 24 23 25   GLUCOSE 123* 93 102* 105* 99  BUN <5* <5* <5* <5* <5*  CREATININE 0.59 0.52 0.58 0.65 0.53  CALCIUM 8.7* 8.5* 8.8* 9.0 8.9   Liver Function Tests: No results for input(s): AST, ALT, ALKPHOS, BILITOT, PROT, ALBUMIN in the last 168 hours. No results for input(s): LIPASE, AMYLASE in the last 168 hours. No results for input(s): AMMONIA in the last 168 hours. CBC: Recent Labs  Lab 11/08/19 0406 11/09/19 0433 11/10/19 0500 11/11/19 0359 11/12/19 0500  WBC 9.8 8.5 8.5 8.6 8.3  HGB 9.7* 9.5* 9.9* 10.1* 10.5*  HCT 28.0* 27.2* 28.7* 28.8* 30.4*  MCV 81.6 81.0 82.0 82.3 82.4  PLT 318 294 322 301 310   Cardiac Enzymes: No results for input(s): CKTOTAL, CKMB, CKMBINDEX, TROPONINI in the last 168 hours. BNP: Invalid input(s): POCBNP CBG: No results for input(s): GLUCAP in the last 168 hours. D-Dimer No results for input(s): DDIMER in the last 72 hours. Hgb A1c No results for input(s): HGBA1C in the last 72 hours. Lipid Profile No results for input(s): CHOL, HDL, LDLCALC, TRIG, CHOLHDL, LDLDIRECT in the last 72 hours. Thyroid function studies Recent Labs    11/10/19 1503  TSH 4.402   Anemia work up No results for input(s): VITAMINB12, FOLATE, FERRITIN, TIBC, IRON, RETICCTPCT in the last 72 hours. Urinalysis    Component Value Date/Time   COLORURINE AMBER (A) 10/27/2019 1429   APPEARANCEUR HAZY (A) 10/27/2019 1429   LABSPEC 1.036 (H) 10/27/2019 1429   PHURINE 5.0  10/27/2019 1429   GLUCOSEU NEGATIVE 10/27/2019 1429   HGBUR LARGE (A) 10/27/2019 [place] 10/27/2019 [place] 10/27/2019 1429   PROTEINUR 100 (A) 10/27/2019 1429   UROBILINOGEN 1.0 01/05/2013 2116   NITRITE NEGATIVE 10/27/2019 1429   LEUKOCYTESUR NEGATIVE 10/27/2019 1429   Sepsis Labs Invalid input(s): PROCALCITONIN,  WBC,  LACTICIDVEN Microbiology No results found for this or any previous visit (from the past 240 hour(s)).  Procedures/Studies: Ct Abdomen Pelvis Wo  Contrast  Result Date: 10/13/2019 CLINICAL DATA:  Generalized abdominal pain with nausea and fever EXAM: CT ABDOMEN AND PELVIS WITHOUT CONTRAST TECHNIQUE: Multidetector CT imaging of the abdomen and pelvis was performed following the standard protocol without IV contrast. COMPARISON:  01/06/2013 FINDINGS: Lower chest: No acute abnormality. Hepatobiliary: Limited without IV contrast. No large focal hepatic abnormality or intrahepatic biliary dilatation. Gallbladder and biliary system unremarkable. Common bile duct nondilated. Pancreas: Unremarkable. No pancreatic ductal dilatation or surrounding inflammatory changes. Spleen: Normal in size without focal abnormality. Adrenals/Urinary Tract: Adrenal glands are unremarkable. Kidneys are normal, without renal calculi, focal lesion, or hydronephrosis. Bladder is unremarkable. Stomach/Bowel: Negative for bowel obstruction, significant dilatation, ileus, or free air. Appendix is unremarkable and retrocecal in position. No acute inflammatory process, fluid collection, abscess, hemorrhage or hematoma Vascular/Lymphatic: Limited without IV contrast. No aneurysm. No adenopathy. Reproductive: Uterus and adnexa normal in size. Trace pelvic free fluid may be physiologic or related to the chronic VP shunt tubing. Other: No inguinal or abdominal wall hernia. Musculoskeletal: Degenerative changes of the spine. IMPRESSION: No acute intra-abdominal or pelvic finding by  noncontrast CT. Trace lower abdominopelvic free fluid may be physiologic or related to the chronic VP shunt tubing. No fluid collection or abscess. Electronically Signed   By: Jerilynn Mages.  Shick M.D.   On: 10/13/2019 12:40   Dg Skull 1-3 Views  Result Date: 10/13/2019 CLINICAL DATA:  33 year old female with fever and right-sided abdominal pain since yesterday. Evaluate for shunt malfunction. EXAM: SKULL - 1-3 VIEW COMPARISON:  No priors. FINDINGS: Left-sided ventriculoperitoneal shunt catheter noted, visualized portions of which are intact. IMPRESSION: 1. Visualized ventriculoperitoneal shunt catheter is intact. Electronically Signed   By: Vinnie Langton M.D.   On: 10/13/2019 18:30   Dg Chest 1 View  Result Date: 10/13/2019 CLINICAL DATA:  33 year old female with history of right-sided abdominal pain since yesterday. Evaluate for potential shunt malfunction. EXAM: CHEST  1 VIEW COMPARISON:  Chest x-ray 10/13/2019. FINDINGS: The heart size and mediastinal contours are within normal limits. Both lungs are clear. The visualized skeletal structures are unremarkable. Surgical clips in the left upper mediastinum. Ventriculoperitoneal shunt catheter projecting over the left hemithorax and upper left abdomen, which appears intact without obvious shunt discontinuities or kinks. IMPRESSION: 1. Ventriculoperitoneal shunt catheter as visualized appears intact. 2. No radiographic evidence of acute cardiopulmonary disease. Electronically Signed   By: Vinnie Langton M.D.   On: 10/13/2019 18:22   Dg Chest 2 View  Result Date: 10/14/2019 CLINICAL DATA:  Initial evaluation for fever of unknown origin. EXAM: CHEST - 2 VIEW COMPARISON:  Prior radiograph from 10/13/2019. FINDINGS: Cardiac and mediastinal silhouettes are stable, and remain within normal limits. Surgical clip overlies the upper left mediastinum. Lungs normally inflated. No focal infiltrates. No edema or effusion. No pneumothorax. VP shunt catheter overlies the  left chest, stable. Osseous structures are unchanged. IMPRESSION: Stable appearance of the chest, with no active cardiopulmonary disease identified. Electronically Signed   By: Jeannine Boga M.D.   On: 10/14/2019 07:28   Dg Abd 1 View  Result Date: 10/31/2019 CLINICAL DATA:  Abdominal distension. EXAM: ABDOMEN - 1 VIEW COMPARISON:  CT scan 10/26/2019 FINDINGS: Ventriculoperitoneal shunt catheter is noted in the upper pelvis. There are air-filled loops of small bowel along with scattered air and stool throughout the colon and down into the rectum. Findings could be due to an ileus or early small bowel obstruction. No free air. The bony structures are stable. IMPRESSION: Ileus versus early small bowel obstruction. Electronically Signed  By: Marijo Sanes M.D.   On: 10/31/2019 10:48   Dg Abd 1 View  Result Date: 10/13/2019 CLINICAL DATA:  33 year old female.  Evaluate for shunt malfunction. EXAM: ABDOMEN - 1 VIEW COMPARISON:  Abdominal radiograph 01/09/2016. FINDINGS: Ventriculoperitoneal shunt catheter seen projecting over the left side of the abdomen, coiled into the mid lower abdomen and upper pelvis. Visualized portions of the catheter appear intact, although there appears to be extra catheter fragments in the low abdomen and upper pelvis. Gas and stool is noted throughout the colon extending to the level of the distal rectum. No definite pathologic dilatation of small bowel. IMPRESSION: 1. Ventriculoperitoneal shunt catheter appears intact. 2. Residual catheter fragment in the lower abdomen, similar to prior examinations. 3. Nonobstructive bowel gas pattern. Electronically Signed   By: Vinnie Langton M.D.   On: 10/13/2019 18:28   Ct Head Wo Contrast  Result Date: 10/27/2019 CLINICAL DATA:  Multilevel constant. Follow-up for VP shunt. Hydrocephalus. Possible infection. EXAM: CT HEAD WITHOUT CONTRAST TECHNIQUE: Contiguous axial images were obtained from the base of the skull through the  vertex without intravenous contrast. COMPARISON:  CT head without contrast 10/13/2019 FINDINGS: Brain: Left frontal approach ventricular shunt is stable. Ventricles are collapsed. 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 Calcifications are present within the left occipital lobe. Chronic calcifications are present in the left occipital lobe. No significant extra-axial collection is present. Shunt position is stable Vascular: No hyperdense vessel or unexpected calcification. Skull: Left frontal burr hole is present. Calvarium is otherwise within normal limits. Sinuses/Orbits: The paranasal sinuses and mastoid air cells are clear. The globes and orbits are within normal limits. IMPRESSION: 1. Stable appearance of VP shunt via a left frontal approach. 2. No hydrocephalus or significant extra-axial fluid collection. 3. Stable chronic findings. Electronically Signed   By: San Morelle M.D.   On: 10/27/2019 15:10   Ct Head Wo Contrast  Result Date: 10/13/2019 CLINICAL DATA:  33 year old female with unexplained altered mental status. EXAM: CT HEAD WITHOUT CONTRAST TECHNIQUE: Contiguous axial images were obtained from the base of the skull through the vertex without intravenous contrast. COMPARISON:  [facility] head CT 01/05/2013. FINDINGS: Brain: Scaphocephaly. Dysgenesis of the corpus callosum. Mildly dysplastic appearing ventricles which remain decompressed since 2014 in the setting of a left superior frontal approach CSF shunt. Parietal and occipital lobe chronic volume loss. Relatively normal appearing posterior fossa. No midline shift, ventriculomegaly, mass effect, evidence of mass lesion, intracranial hemorrhage or evidence of cortically based acute infarction. Vascular: No suspicious intracranial vascular hyperdensity. Skull: Chronic superior frontal burr holes. No acute osseous abnormality identified. Sinuses/Orbits: Visualized paranasal sinuses and mastoids are stable and well  pneumatized. Other: Left convexity CSF reservoir with shunt tubing continuing caudally in the left neck. Partial calcification of the catheter beginning in the left suboccipital region, but no adverse features. Chronic postoperative changes to the contralateral right scalp. Rightward gaze deviation. IMPRESSION: 1. No acute intracranial abnormality. 2. Left frontal approach CSF shunt appears stable along with decompressed ventricles since 2014. 3. Underlying dysgenesis of the corpus callosum, scaphocephaly, chronic parieto-occipital cerebral volume loss. Electronically Signed   By: Genevie Ann M.D.   On: 10/13/2019 18:40   Ct Chest W Contrast  Result Date: 10/26/2019 CLINICAL DATA:  33 year old female with lower extremity DVT and concern for pulmonary embolism. Abdominal distension and pain. History of cerebral palsy. EXAM: CT CHEST, ABDOMEN, AND PELVIS WITH CONTRAST TECHNIQUE: Multidetector CT imaging of the chest, abdomen and pelvis was performed following  the standard protocol during bolus administration of intravenous contrast. CONTRAST:  197m OMNIPAQUE IOHEXOL 300 MG/ML  SOLN COMPARISON:  Chest radiograph dated 10/23/2019 and CT of the abdomen pelvis dated 01/06/2013. FINDINGS: Evaluation is limited due to streak artifact caused by patient's arms. CT CHEST FINDINGS Cardiovascular: There is no cardiomegaly or pericardial effusion. The thoracic aorta is unremarkable. The main pulmonary trunk and central pulmonary arteries appear grossly unremarkable. This is not a diagnostic PE protocol study. Left-sided PICC with tip close to the cavoatrial junction. Mediastinum/Nodes: There is no hilar or mediastinal adenopathy. The esophagus and the thyroid gland are grossly unremarkable. No mediastinal fluid collection. Lungs/Pleura: There is consolidative changes of the majority of the right middle lobe which may represent atelectasis or pneumonia. Patchy bilateral lower lobe ground-glass and nodular densities most  concerning for pneumonia. Clinical correlation and follow-up to resolution recommended. There is no pleural effusion or pneumothorax. The central airways are patent. Musculoskeletal: No chest wall mass or suspicious bone lesions identified. CT ABDOMEN PELVIS FINDINGS There is no intra-abdominal free air. There is a small free fluid in the pelvis. Hepatobiliary: The liver is grossly unremarkable. No intrahepatic biliary ductal dilatation. No calcified gallstone or pericholecystic fluid. Pancreas: Unremarkable. No pancreatic ductal dilatation or surrounding inflammatory changes. Spleen: Normal in size without focal abnormality. Adrenals/Urinary Tract: The adrenal glands are unremarkable. There is no hydronephrosis on either side. The visualized ureters and urinary bladder appear unremarkable. Stomach/Bowel: Mild thickened appearance of the distal colon and rectosigmoid, likely related to inflammatory changes of the pelvis. There is no bowel obstruction. The appendix is unremarkable. Vascular/Lymphatic: The abdominal aorta and IVC are unremarkable. The SMV, splenic vein, and main portal vein are patent. No portal venous gas. There is no adenopathy. Reproductive: The uterus is anteverted and grossly unremarkable. No adnexal masses. Other: A VP shunt terminates in the pelvis superior to the uterus. There is free fluid within the pelvis secondary to VP shunt. There is a 5.6 x 2.5 cm loculated appearing fluid adjacent to the tip of the VP shunt in the left anterior pelvis anterior to the uterus and the left ovary. There is apparent enhancement of the adjacent mesentery. Superimposed infection is not excluded. Clinical correlation is recommended. A second tube is noted within the pelvis, likely related to prior VP shunt. Musculoskeletal: No acute or significant osseous findings. IMPRESSION: 1. Findings concerning for multifocal pneumonia. Clinical correlation and follow-up to resolution recommended. 2. A 5.6 x 2.5 cm  loculated appearing fluid collection adjacent to the tip of the VP shunt in the left anterior pelvis anterior to the uterus and the left ovary. There is apparent enhancement of the adjacent mesentery concerning for possible superimposed infection. Clinical correlation is recommended. 3. Mild thickened appearance of the distal colon and rectosigmoid likely related to inflammatory changes of the pelvis. No bowel obstruction. Normal appendix. Electronically Signed   By: AAnner CreteM.D.   On: 10/26/2019 23:48   Ct Abdomen Pelvis W Contrast  Result Date: 11/03/2019 CLINICAL DATA:  Abdominal pain, fever, abscess suspected EXAM: CT ABDOMEN AND PELVIS WITH CONTRAST TECHNIQUE: Multidetector CT imaging of the abdomen and pelvis was performed using the standard protocol following bolus administration of intravenous contrast. CONTRAST:  1034mOMNIPAQUE IOHEXOL 300 MG/ML SOLN, additional oral enteric contrast COMPARISON:  10/26/2019 FINDINGS: Lower chest: Bibasilar atelectasis or consolidation, similar to prior examination. Trace bilateral pleural effusions. Hepatobiliary: No solid liver abnormality is seen. No gallstones, gallbladder wall thickening, or biliary dilatation. Pancreas: Unremarkable. No pancreatic ductal dilatation or  surrounding inflammatory changes. Spleen: Normal in size without significant abnormality. Adrenals/Urinary Tract: Adrenal glands are unremarkable. Kidneys are normal, without renal calculi, solid lesion, or hydronephrosis. Bladder is unremarkable. Stomach/Bowel: Stomach is within normal limits. Appendix appears normal. Thickened, inflamed appearance of the distal colon and rectum is improved compared to prior examination. Vascular/Lymphatic: No significant vascular findings are present. No enlarged abdominal or pelvic lymph nodes. Reproductive: No mass or other significant abnormality. Other: No abdominal wall hernia or abnormality. There are two segments of intra-abdominal shunt catheter  tubing, one tip positioned in the lower right hemiabdomen (series 3, image 70) and one positioned in the upper right hemiabdomen anterior to the cecum (series 3, image 41). Of note, the superior catheter tip is mobile when compared to prior examination, and was previously seen to rest within a fluid collection, which has since resolved or redistributed. There remains a small volume of ascites about the abdomen and pelvis. Musculoskeletal: No acute or significant osseous findings. IMPRESSION: 1. There are two segments of intra-abdominal shunt catheter tubing, one tip positioned in the lower right hemiabdomen (series 3, image 70) and one positioned in the upper right hemiabdomen anterior to the cecum (series 3, image 41). Of note, the superior catheter tip is mobile when compared to prior examination, and was previously seen to rest within a fluid collection, which has since resolved or redistributed. There remains a small volume of ascites about the abdomen and pelvis. 2. Bibasilar atelectasis or consolidation, similar to prior examination. Trace bilateral pleural effusions. Further concerning for infection or aspiration. 3. Thickened, inflamed appearance of the distal colon and rectum is improved compared to prior examination, consistent with resolving infectious or inflammatory colitis. Electronically Signed   By: Eddie Candle M.D.   On: 11/03/2019 17:02   Ct Abdomen Pelvis W Contrast  Result Date: 10/26/2019 CLINICAL DATA:  33 year old female with lower extremity DVT and concern for pulmonary embolism. Abdominal distension and pain. History of cerebral palsy. EXAM: CT CHEST, ABDOMEN, AND PELVIS WITH CONTRAST TECHNIQUE: Multidetector CT imaging of the chest, abdomen and pelvis was performed following the standard protocol during bolus administration of intravenous contrast. CONTRAST:  170m OMNIPAQUE IOHEXOL 300 MG/ML  SOLN COMPARISON:  Chest radiograph dated 10/23/2019 and CT of the abdomen pelvis dated  01/06/2013. FINDINGS: Evaluation is limited due to streak artifact caused by patient's arms. CT CHEST FINDINGS Cardiovascular: There is no cardiomegaly or pericardial effusion. The thoracic aorta is unremarkable. The main pulmonary trunk and central pulmonary arteries appear grossly unremarkable. This is not a diagnostic PE protocol study. Left-sided PICC with tip close to the cavoatrial junction. Mediastinum/Nodes: There is no hilar or mediastinal adenopathy. The esophagus and the thyroid gland are grossly unremarkable. No mediastinal fluid collection. Lungs/Pleura: There is consolidative changes of the majority of the right middle lobe which may represent atelectasis or pneumonia. Patchy bilateral lower lobe ground-glass and nodular densities most concerning for pneumonia. Clinical correlation and follow-up to resolution recommended. There is no pleural effusion or pneumothorax. The central airways are patent. Musculoskeletal: No chest wall mass or suspicious bone lesions identified. CT ABDOMEN PELVIS FINDINGS There is no intra-abdominal free air. There is a small free fluid in the pelvis. Hepatobiliary: The liver is grossly unremarkable. No intrahepatic biliary ductal dilatation. No calcified gallstone or pericholecystic fluid. Pancreas: Unremarkable. No pancreatic ductal dilatation or surrounding inflammatory changes. Spleen: Normal in size without focal abnormality. Adrenals/Urinary Tract: The adrenal glands are unremarkable. There is no hydronephrosis on either side. The visualized ureters and urinary bladder  appear unremarkable. Stomach/Bowel: Mild thickened appearance of the distal colon and rectosigmoid, likely related to inflammatory changes of the pelvis. There is no bowel obstruction. The appendix is unremarkable. Vascular/Lymphatic: The abdominal aorta and IVC are unremarkable. The SMV, splenic vein, and main portal vein are patent. No portal venous gas. There is no adenopathy. Reproductive: The uterus  is anteverted and grossly unremarkable. No adnexal masses. Other: A VP shunt terminates in the pelvis superior to the uterus. There is free fluid within the pelvis secondary to VP shunt. There is a 5.6 x 2.5 cm loculated appearing fluid adjacent to the tip of the VP shunt in the left anterior pelvis anterior to the uterus and the left ovary. There is apparent enhancement of the adjacent mesentery. Superimposed infection is not excluded. Clinical correlation is recommended. A second tube is noted within the pelvis, likely related to prior VP shunt. Musculoskeletal: No acute or significant osseous findings. IMPRESSION: 1. Findings concerning for multifocal pneumonia. Clinical correlation and follow-up to resolution recommended. 2. A 5.6 x 2.5 cm loculated appearing fluid collection adjacent to the tip of the VP shunt in the left anterior pelvis anterior to the uterus and the left ovary. There is apparent enhancement of the adjacent mesentery concerning for possible superimposed infection. Clinical correlation is recommended. 3. Mild thickened appearance of the distal colon and rectosigmoid likely related to inflammatory changes of the pelvis. No bowel obstruction. Normal appendix. Electronically Signed   By: Anner Crete M.D.   On: 10/26/2019 23:48   Dg Chest Port 1 View  Result Date: 10/27/2019 CLINICAL DATA:  Central line placement. EXAM: PORTABLE CHEST 1 VIEW COMPARISON:  Prior today FINDINGS: A new right jugular central venous catheter is seen with tip overlying the inferior aspect of the right atrium. A left arm PICC line remains in appropriate position. No evidence of pneumothorax. Shunt tubing is again seen overlying the left hemithorax. Low lung volumes are again seen. Bilateral lower lung airspace disease shows no significant change. Heart size remains within normal limits. IMPRESSION: New right jugular central venous catheter tip overlies the inferior aspect of the right atrium. No evidence of  pneumothorax. Stable bilateral lower lung airspace disease. Electronically Signed   By: Marlaine Hind M.D.   On: 10/27/2019 22:10   Dg Chest Port 1 View  Result Date: 10/27/2019 CLINICAL DATA:  Hypoxia. EXAM: PORTABLE CHEST 1 VIEW COMPARISON:  Earlier film, same date. FINDINGS: The cardiac silhouette, mediastinal and hilar contours are stable. The left PICC line is stable. Persistent and slight worsening bibasilar infiltrates. No definite pleural effusions. IMPRESSION: Progressive bibasilar infiltrates. Electronically Signed   By: Marijo Sanes M.D.   On: 10/27/2019 17:38   Dg Chest Port 1 View  Result Date: 10/27/2019 CLINICAL DATA:  Fever and abdominal pain EXAM: PORTABLE CHEST 1 VIEW COMPARISON:  10/23/2019 FINDINGS: Again noted are multifocal airspace opacities bilaterally. The lung volumes are low. There is a well-positioned left-sided PICC line with tip terminating near the cavoatrial junction. A VP shunt is noted. IMPRESSION: 1. Well-positioned left-sided PICC line. 2. Low lung volumes with persistent multifocal airspace opacities better appreciated on prior CT. Electronically Signed   By: Constance Holster M.D.   On: 10/27/2019 03:25   Dg Chest Port 1 View  Result Date: 10/23/2019 CLINICAL DATA:  Chest pain. EXAM: PORTABLE CHEST 1 VIEW COMPARISON:  October 14, 2019. FINDINGS: The heart size and mediastinal contours are within normal limits. No pneumothorax or pleural effusion is noted. Right lung is clear. Mild left  basilar atelectasis is noted. The visualized skeletal structures are unremarkable. Left-sided ventriculoperitoneal shunt is again noted with associated calcification. IMPRESSION: Mild left basilar subsegmental atelectasis. Left-sided ventriculoperitoneal shunt is noted with associated calcification. Electronically Signed   By: Marijo Conception M.D.   On: 10/23/2019 08:20   Dg Abd Portable 1v  Result Date: 10/23/2019 CLINICAL DATA:  Epigastric abdominal pain. EXAM: PORTABLE  ABDOMEN - 1 VIEW COMPARISON:  October 13, 2019. FINDINGS: The bowel gas pattern is normal. No free air is noted. Distal tip of ventriculoperitoneal shunt is seen in the central portion of the abdomen. No abnormal calcifications are noted. IMPRESSION: Distal tip of left-sided ventriculoperitoneal shunt is seen in central portion of the abdomen. There is no evidence of bowel obstruction or ileus. Electronically Signed   By: Marijo Conception M.D.   On: 10/23/2019 08:22   Dg Swallowing Func-speech Pathology  Result Date: 10/31/2019 Objective Swallowing Evaluation: Type of Study: MBS-Modified Barium Swallow Study  Patient Details Name: Tracey Graham MRN: [id number] Date of Birth: 10-15-1986 Today's Date: 10/31/2019 Time: SLP Start Time (ACUTE ONLY): 1302 -SLP Stop Time (ACUTE ONLY): 1321 SLP Time Calculation (min) (ACUTE ONLY): 19 min Past Medical History: Past Medical History: Diagnosis Date . Hydrocephalus ([place])  . Seizures ([place])  . VP (ventriculoperitoneal) shunt status  Past Surgical History: Past Surgical History: Procedure Laterality Date . CARDIAC SURGERY   . ESOPHAGOGASTRODUODENOSCOPY (EGD) WITH PROPOFOL N/A 10/25/2019  Procedure: ESOPHAGOGASTRODUODENOSCOPY (EGD) WITH PROPOFOL;  Surgeon: Rush Landmark Telford Nab., MD;  Location: [place];  Service: Gastroenterology;  Laterality: N/A; . VENTRICULOPERITONEAL SHUNT   HPI: Wynema Garoutte is a 33 year old woman with hx of CP who presented on 10/16 with encephalopathy, abdominal pain, and fevers. 10/29 EGD showed LA Grade C esophagitis distally. She failed to respond to antibiotics, and was found to have a left lower extremity DVT.  Since then she has had persistent fevers, raising concern for an infection associated with her VP shunt.  CT scan demonstrated a loculated fluid collection near the tip of the shunt in her pelvis.  Due to her anticoagulation she has been unable to have this fluid sampled.  She has been followed by ID.  CXR showed worsening bilateral  pulmonary infiltrates worse at bases. Transferred to ICU 10/31 due to high risk for decompensation and need for higher level of care. BSE completed 10/18 with recommendation for D1/thin meds crushed in puree.  No data recorded Assessment / Plan / Recommendation CHL IP CLINICAL IMPRESSIONS 10/31/2019 Clinical Impression Pt joined by mother who assisted in feeding during MBS in order to ease pt anxiety and support PO intake as pt demonstrated aversion to barrium trials. Pt exhibited oropharyngeal dysphagia with mild risk of aspiration and was observed with thin (straw), puree, and regular solids. Cup presentation of thins not accepted. Thin via straw had decreased bolus cohesion, delayed swallow initiation and epiglottic inversion resulting in 1x instance of penetration above VF before swallow which was cleared with swallow. With puree pt had decreased bolus cohesion, lingual pumping, and delayed oral transit and swallow initiation. Pt initially refused solid presentations but once it was within oral cavity pt accepted and masticated it with reduced rotary chewing and decreased bolus cohesion prior to swallow. Overall, pts oral phase and feeding difficulties are the main barriers to proceeding with regular diet. Pharyngeal phase significant for delayed swallow initiation, although once swallow is triggered it is well coordinated with no residue. Suspect pts penetration with thin due to pts aversion resulting in verbalizations and  delayed swallow. Instances of gagging and coughing with absence of bolus going beyond valleculae present. Recommend continue with current diet of Dys 3 (soft) and thin liquids (straw or cup), meds crushed in puree. Education provided to mother on aspiration precautions and she agrees with current POC. SLP will not follow for swallowing treatment.  SLP Visit Diagnosis Dysphagia, oropharyngeal phase (R13.12) Attention and concentration deficit following -- Frontal lobe and executive function  deficit following -- Impact on safety and function Mild aspiration risk   CHL IP TREATMENT RECOMMENDATION 10/31/2019 Treatment Recommendations No treatment recommended at this time   Prognosis 10/14/2019 Prognosis for Safe Diet Advancement Good Barriers to Reach Goals Cognitive deficits Barriers/Prognosis Comment -- CHL IP DIET RECOMMENDATION 10/31/2019 SLP Diet Recommendations Dysphagia 3 (Mech soft) solids;Thin liquid Liquid Administration via Cup;Straw Medication Administration Crushed with puree Compensations Minimize environmental distractions;Slow rate;Small sips/bites;Lingual sweep for clearance of pocketing Postural Changes Seated upright at 90 degrees   CHL IP OTHER RECOMMENDATIONS 10/31/2019 Recommended Consults -- Oral Care Recommendations Oral care BID;Staff/trained caregiver to provide oral care Other Recommendations --   CHL IP FOLLOW UP RECOMMENDATIONS 10/31/2019 Follow up Recommendations 24 hour supervision/assistance   CHL IP FREQUENCY AND DURATION 10/14/2019 Speech Therapy Frequency (ACUTE ONLY) min 2x/week Treatment Duration 2 weeks      CHL IP ORAL PHASE 10/31/2019 Oral Phase Impaired Oral - Pudding Teaspoon -- Oral - Pudding Cup -- Oral - Honey Teaspoon -- Oral - Honey Cup -- Oral - Nectar Teaspoon -- Oral - Nectar Cup -- Oral - Nectar Straw -- Oral - Thin Teaspoon -- Oral - Thin Cup -- Oral - Thin Straw Decreased bolus cohesion Oral - Puree Lingual pumping;Delayed oral transit;Decreased bolus cohesion Oral - Mech Soft -- Oral - Regular Impaired mastication;Decreased bolus cohesion Oral - Multi-Consistency -- Oral - Pill -- Oral Phase - Comment --  CHL IP PHARYNGEAL PHASE 10/31/2019 Pharyngeal Phase Impaired Pharyngeal- Pudding Teaspoon -- Pharyngeal -- Pharyngeal- Pudding Cup -- Pharyngeal -- Pharyngeal- Honey Teaspoon -- Pharyngeal -- Pharyngeal- Honey Cup -- Pharyngeal -- Pharyngeal- Nectar Teaspoon -- Pharyngeal -- Pharyngeal- Nectar Cup -- Pharyngeal -- Pharyngeal- Nectar Straw -- Pharyngeal --  Pharyngeal- Thin Teaspoon -- Pharyngeal -- Pharyngeal- Thin Cup -- Pharyngeal -- Pharyngeal- Thin Straw Delayed swallow initiation-vallecula Pharyngeal Material enters airway, remains ABOVE vocal cords then ejected out Pharyngeal- Puree Delayed swallow initiation-vallecula Pharyngeal -- Pharyngeal- Mechanical Soft -- Pharyngeal -- Pharyngeal- Regular WFL Pharyngeal -- Pharyngeal- Multi-consistency -- Pharyngeal -- Pharyngeal- Pill -- Pharyngeal -- Pharyngeal Comment --  CHL IP CERVICAL ESOPHAGEAL PHASE 10/31/2019 Cervical Esophageal Phase WFL Pudding Teaspoon -- Pudding Cup -- Honey Teaspoon -- Honey Cup -- Nectar Teaspoon -- Nectar Cup -- Nectar Straw -- Thin Teaspoon -- Thin Cup -- Thin Straw -- Puree -- Mechanical Soft -- Regular -- Multi-consistency -- Pill -- Cervical Esophageal Comment -- [name] 10/31/2019, 9:00 PM    [name] Litaker M.Ed Actor Pager [phone] Office [phone]           Vas [place] Lower Extremity Venous (dvt)  Result Date: 10/14/2019  Lower Venous Study Indications: Fever, elevated D-Dimer.  Risk Factors: Cerebal Palsy, wheelchair bound, abdominal pain. Limitations: Body habitus and edema, depth of vessels. Comparison Study: No prior study on file Performing Technologist: Sharion Dove RVS  Examination Guidelines: A complete evaluation includes B-mode imaging, spectral Doppler, color Doppler, and power Doppler as needed of all accessible portions of each vessel. Bilateral testing is considered an integral part of a complete examination. Limited examinations for  reoccurring indications may be performed as noted.  +---------+---------------+---------+-----------+----------+-------------------+ RIGHT    CompressibilityPhasicitySpontaneityPropertiesThrombus Aging      +---------+---------------+---------+-----------+----------+-------------------+ CFV      Full           Yes      Yes                                       +---------+---------------+---------+-----------+----------+-------------------+ SFJ      Full                                                             +---------+---------------+---------+-----------+----------+-------------------+ FV Prox  Full                                                             +---------+---------------+---------+-----------+----------+-------------------+ FV Mid   Full                                                             +---------+---------------+---------+-----------+----------+-------------------+ FV DistalFull                                                             +---------+---------------+---------+-----------+----------+-------------------+ PFV      Full                                                             +---------+---------------+---------+-----------+----------+-------------------+ POP                                                   Patent by color and                                                       Doppler             +---------+---------------+---------+-----------+----------+-------------------+ PERO                                                  Not visualized      +---------+---------------+---------+-----------+----------+-------------------+   +---------+---------------+---------+-----------+----------+-------------------+ LEFT  CompressibilityPhasicitySpontaneityPropertiesThrombus Aging      +---------+---------------+---------+-----------+----------+-------------------+ CFV      Full           Yes      Yes                                      +---------+---------------+---------+-----------+----------+-------------------+ SFJ      Full                                                             +---------+---------------+---------+-----------+----------+-------------------+ FV Prox  Full                                                              +---------+---------------+---------+-----------+----------+-------------------+ FV Mid   Full                                                             +---------+---------------+---------+-----------+----------+-------------------+ FV DistalFull                                                             +---------+---------------+---------+-----------+----------+-------------------+ PFV      Full                                                             +---------+---------------+---------+-----------+----------+-------------------+ POP                                                   Patent by color and                                                       Dopple              +---------+---------------+---------+-----------+----------+-------------------+ PTV      None                                         Acute               +---------+---------------+---------+-----------+----------+-------------------+ PERO     None  Acute               +---------+---------------+---------+-----------+----------+-------------------+     Summary: Right: There is no evidence of deep vein thrombosis in the lower extremity. However, portions of this examination were limited- see technologist comments above. Left: Findings consistent with acute deep vein thrombosis involving the left posterior tibial veins, and left peroneal veins.  *See table(s) above for measurements and observations. Electronically signed by Harold Barban MD on 10/14/2019 at 4:36:11 PM.    Final    Irpicc Placement Left >5 Yrs Inc Img Guide  Result Date: 10/25/2019 INDICATION: Patient with history of cerebral palsy, lower extremity DVT, abdominal pain, fever, poor venous access; request received for central venous access for fluids/medications. EXAM: ULTRASOUND AND FLUOROSCOPIC GUIDED PICC LINE INSERTION MEDICATIONS: 1% lidocaine to skin/SQ tissue  ANESTHESIA/SEDATION: None FLUOROSCOPY TIME:  Fluoroscopy Time:  36 seconds (1.2 mGy). COMPLICATIONS: None immediate. TECHNIQUE: The procedure, risks, benefits, and alternatives were explained to patient's mother and informed written consent was obtained. A timeout was performed prior to the initiation of the procedure. The left upper extremity was prepped with chlorhexidine in a sterile fashion, and a sterile drape was applied covering the operative field. Maximum barrier sterile technique with sterile gowns and gloves were used for the procedure. A timeout was performed prior to the initiation of the procedure. Local anesthesia was provided with 1% lidocaine. Under direct ultrasound guidance, the left brachial vein was accessed with a micropuncture kit after the overlying soft tissues were anesthetized with 1% lidocaine. An ultrasound image was saved for documentation purposes. A guidewire was advanced to the level of the superior caval-atrial junction for measurement purposes and the PICC line was cut to length. A peel-away sheath was placed and a 41 cm, 5 [place], dual lumen was inserted to level of the superior caval-atrial junction. A post procedure spot fluoroscopic was obtained. The catheter easily aspirated and flushed and was sutured in place. A dressing was placed. The patient tolerated the procedure well without immediate post procedural complication. FINDINGS: After catheter placement, the tip lies within the superior cavoatrial junction ;the catheter aspirates and flushes normally and is ready for immediate use. IMPRESSION: Successful ultrasound and fluoroscopic guided placement of a left brachial vein approach, 41 cm 5 French, dual lumen PICC with tip at the superior caval-atrial junction. The PICC line is ready for immediate use. Read by: Rowe Robert, PA-C Electronically Signed   By: Lucrezia Europe M.D.   On: 10/25/2019 12:51   Dg Fl Guided Lumbar Puncture  Result Date: 10/30/2019 CLINICAL DATA:   33 year old female with suspicion of infected CSF shunt. Patient currently on a blood thinner regimen due to DVT, which has been appropriately adjusted to allow for this procedure. Cerebral palsy repeat CSF analysis requested. EXAM: DIAGNOSTIC LUMBAR PUNCTURE UNDER FLUOROSCOPIC GUIDANCE COMPARISON:  Fluoroscopic guided lumbar puncture 10/17/2019. CT Abdomen and Pelvis 10/26/2019. FLUOROSCOPY TIME:  Fluoroscopy Time:  0 minutes 12 seconds Radiation Exposure Index (if provided by the fluoroscopic device): Number of Acquired Spot Images: 0 PROCEDURE: Informed consent was obtained from the patient's mother prior to the procedure, including potential complications of headache, allergy, and pain. A "time-out" was performed. With the patient prone, the lower back was prepped with Betadine. 1% Lidocaine was used for local anesthesia. Lumbar puncture was performed at the L2-L3 level using right sub laminar technique and a 3.5 in x 20 gauge needle with return of initially slightly blood tinged but ultimately clear CSF. The thecal sac was just barely  within reach using a 3.5 in needle (had to be hubbed and then held in place, and no CSF opening pressure measurement could be obtained. 19.5 ml of CSF were obtained for laboratory studies. Bleeding was minimal. The patient tolerated the procedure well and there were no apparent complications. Appropriate post procedural orders were placed on the chart. The patient was returned to the inpatient floor in stable condition for continued treatment. IMPRESSION: Fluoroscopic guided lumbar puncture at L2-L3. 19.5 mL of CSF obtained for laboratory analysis. Electronically Signed   By: Genevie Ann M.D.   On: 10/30/2019 12:01   Dg Fl Guided Lumbar Puncture  Result Date: 10/17/2019 CLINICAL DATA:  Fever. EXAM: DIAGNOSTIC LUMBAR PUNCTURE UNDER FLUOROSCOPIC GUIDANCE FLUOROSCOPY TIME:  Fluoroscopy Time:  18 seconds Radiation Exposure Index (if provided by the fluoroscopic device): 1.8 mGy  Number of Acquired Spot Images: 0 PROCEDURE: Informed consent was obtained from the patient prior to the procedure, including potential complications of headache, allergy, and pain. With the patient prone, the lower back was prepped with Betadine. 1% Lidocaine was used for local anesthesia. Lumbar puncture was performed at the L4-L5 level using a 21 gauge needle with return of clear CSF with grossly normal opening pressure to slightly diminished, CSF flowed slowly mild blood-tinged was noted on later samples perhaps from adjacent venous plexus, 7 ml of CSF were obtained for laboratory studies. The patient tolerated the procedure well and there were no apparent complications. IMPRESSION: Technically successful L4-L5 lumbar puncture without signs of complication. Electronically Signed   By: Zetta Bills M.D.   On: 10/17/2019 10:19   US Abdomen Limited Ruq  Result Date: 10/14/2019 CLINICAL DATA:  Abdominal pain x2 days EXAM: ULTRASOUND ABDOMEN LIMITED RIGHT UPPER QUADRANT COMPARISON:  None. FINDINGS: Gallbladder: No gallstones, gallbladder wall thickening, or pericholecystic fluid. Negative sonographic Murphy's sign. Common bile duct: Diameter: 4 mm Liver: Poorly visualized. No focal lesion identified. Within normal limits in parenchymal echogenicity. Portal vein is patent on color Doppler imaging with normal direction of blood flow towards the liver. Other: None. IMPRESSION: Negative right upper quadrant ultrasound. Electronically Signed   By: Julian Hy M.D.   On: 10/14/2019 12:47    Time coordinating discharge: Over 30 minutes  SIGNED:   Guilford Shi, MD  [facility] 11/12/2019, 11:22 AM Pager : [phone]

## 2019-12-05 ENCOUNTER — Ambulatory Visit (INDEPENDENT_AMBULATORY_CARE_PROVIDER_SITE_OTHER): Payer: Medicaid Other | Admitting: Internal Medicine

## 2019-12-05 ENCOUNTER — Encounter: Payer: Self-pay | Admitting: Internal Medicine

## 2019-12-05 ENCOUNTER — Other Ambulatory Visit: Payer: Self-pay

## 2019-12-05 VITALS — BP 135/80 | HR 108

## 2019-12-05 DIAGNOSIS — R509 Fever, unspecified: Secondary | ICD-10-CM

## 2019-12-05 NOTE — Progress Notes (Signed)
RFV: follow up for hospitalization presumed VP shunt infection  Patient ID: Tracey Graham, female   DOB: 1986-07-20, 33 y.o.   MRN: [id number]  HPI 33yo F with CP, developmental delay, hx of ventral shunt for hydrocephaly admitted for FUO work up revealed DVT but then still had ongoing fevers despite treatment of DVT. Imaging suggested fluid collection about shunt in peritoneum. She responded to abtx. Now back to baseline. Had some vaginal candidiasis post hospitalization now improved after receiving treatment from pcp. Mother reports that her daughter is back to her baseline health. No complaints of abdominal pain nor having any fevers or neck pain  Outpatient Encounter Medications as of 12/05/2019  Medication Sig  . acetaminophen (TYLENOL) 160 MG/5ML liquid Take 500 mg by mouth every 4 (four) hours as needed for fever.  Marland Kitchen apixaban (ELIQUIS) 5 MG TABS tablet Take 2 tablets (10mg ) twice daily for 7 days, then 1 tablet (5mg ) twice daily  . diphenhydrAMINE (BENYLIN) 12.5 MG/5ML syrup Take 10 mLs (25 mg total) by mouth every 4 (four) hours as needed for allergies.  Marland Kitchen docusate (COLACE) 50 MG/5ML liquid Take 10 mLs (100 mg total) by mouth daily as needed for mild constipation or moderate constipation.  . fluticasone (FLONASE) 50 MCG/ACT nasal spray Place 2 sprays into both nostrils daily.  . metoprolol tartrate (LOPRESSOR) 25 MG tablet Take 0.5 tablets (12.5 mg total) by mouth 2 (two) times daily. Hold HR <80  . omeprazole (PRILOSEC) 20 MG capsule Take 1 capsule (20 mg total) by mouth 2 (two) times daily before a meal. Open capsule and gently mix granules in acidic fruit juice.*Pour the mixture down the tube, flush tube with additional juice. Clamp tube for at least 1 hour  . PHENobarbital (LUMINAL) 97.2 MG tablet Take 97.2 mg by mouth at bedtime.   . polyethylene glycol (MIRALAX) packet Take 17 g by mouth daily.  Marland Kitchen PRESCRIPTION MEDICATION Patient was seen 05/24/2019.  I agree with recommendation  for Focus tilt in space wheelchair  . Nutritional Supplements (ENSURE HIGH PROTEIN) LIQD Take 1 Can by mouth 2 (two) times daily. (Patient not taking: Reported on 12/05/2019)   No facility-administered encounter medications on file as of 12/05/2019.      Patient Active Problem List   Diagnosis Date Noted  . Sinus tachycardia 11/12/2019  . Multiple gastric ulcers 11/12/2019  . AMS (altered mental status)   . Abdominal pain   . Nuchal rigidity   . Aseptic meningitis   . Constipation   . Deep vein thrombosis (DVT) of femoral vein of left lower extremity ([place])   . Infection of VP (ventriculoperitoneal) shunt ([place]) 10/13/2019  . Hydrocephalus ([place])   . VP (ventriculoperitoneal) shunt status   . Encephalopathy      Health Maintenance Due  Topic Date Due  . TETANUS/TDAP  03/31/2005  . PAP SMEAR-Modifier  04/01/2007  . INFLUENZA VACCINE  07/28/2019    Sochx: no etoh, nor smoking, nor illicit drug use Review of Systems 12 point ros is negative per mother Physical Exam   BP 135/80   Pulse (!) 108   LMP 11/12/2019 (Approximate)   SpO2 99%   Physical Exam  Constitutional:  oriented to person, talkative, boisterous at her baseline. appears well-developed and well-nourished. No distress.  HENT: [place]/AT, PERRLA, no scleral icterus Mouth/Throat: Oropharynx is clear and moist. No oropharyngeal exudate.  Cardiovascular: Normal rate, regular rhythm and normal heart sounds. Exam reveals no gallop and no friction rub.  No murmur heard.  Pulmonary/Chest: Effort normal  and breath sounds normal. No respiratory distress.  has no wheezes.  Neck = supple, no nuchal rigidity Abdominal: Soft. Bowel sounds are normal.  exhibits no distension. There is no tenderness.  Lymphadenopathy: no cervical adenopathy. No axillary adenopathy Neurological: alert and oriented to person, place, and time.  Skin: Skin is warm and dry. No rash noted. No erythema.  Psychiatric: a normal mood and affect.  behavior is  normal.   CBC Lab Results  Component Value Date   WBC 8.3 11/12/2019   RBC 3.69 (L) 11/12/2019   HGB 10.5 (L) 11/12/2019   HCT 30.4 (L) 11/12/2019   PLT 310 11/12/2019   MCV 82.4 11/12/2019   MCH 28.5 11/12/2019   MCHC 34.5 11/12/2019   RDW 15.9 (H) 11/12/2019   LYMPHSABS 1.2 10/27/2019   MONOABS 1.0 10/27/2019   EOSABS 0.1 10/27/2019    BMET Lab Results  Component Value Date   NA 138 11/12/2019   K 3.9 11/12/2019   CL 104 11/12/2019   CO2 25 11/12/2019   GLUCOSE 99 11/12/2019   BUN <5 (L) 11/12/2019   CREATININE 0.53 11/12/2019   CALCIUM 8.9 11/12/2019   GFRNONAA >60 11/12/2019   GFRAA >60 11/12/2019      Assessment and Plan  FUO-presumed peritonitis unclear if related to VP shunt malfunction/dispalcement Health maintenance =Deferred flu vaccine at this time  No need for further abtx. Appears at her baseline/ repeat abd ct imaging on 11/7 - showed resolution of intra abd fluid collection  rtc as needed

## 2019-12-06 ENCOUNTER — Ambulatory Visit: Payer: Medicaid Other | Admitting: Physician Assistant

## 2019-12-11 DIAGNOSIS — R0902 Hypoxemia: Secondary | ICD-10-CM

## 2019-12-12 ENCOUNTER — Ambulatory Visit: Payer: Medicaid Other | Admitting: Gastroenterology

## 2019-12-14 ENCOUNTER — Ambulatory Visit: Payer: Medicaid Other | Admitting: Physician Assistant

## 2019-12-26 ENCOUNTER — Ambulatory Visit (INDEPENDENT_AMBULATORY_CARE_PROVIDER_SITE_OTHER): Payer: Medicaid Other | Admitting: Gastroenterology

## 2019-12-26 ENCOUNTER — Encounter: Payer: Self-pay | Admitting: Gastroenterology

## 2019-12-26 ENCOUNTER — Telehealth: Payer: Self-pay

## 2019-12-26 VITALS — BP 136/98 | HR 100 | Temp 98.4°F | Ht 62.0 in | Wt 160.0 lb

## 2019-12-26 DIAGNOSIS — K209 Esophagitis, unspecified without bleeding: Secondary | ICD-10-CM

## 2019-12-26 DIAGNOSIS — K259 Gastric ulcer, unspecified as acute or chronic, without hemorrhage or perforation: Secondary | ICD-10-CM | POA: Diagnosis not present

## 2019-12-26 DIAGNOSIS — D649 Anemia, unspecified: Secondary | ICD-10-CM | POA: Diagnosis not present

## 2019-12-26 NOTE — Patient Instructions (Addendum)
I am recommending an upper endoscopy to follow-up on your stomach ulcers when you are able to stop the Eliquis. Please call to schedule at your convenience.   Please continue to take the omeprazole or pantoprazole every day until your endoscopy.  Continue to avoid all NSAIDs like ibuprofen.   If you are age 33 or older, your body mass index should be between 23-30. Your Body mass index is 29.26 kg/m. If this is out of the aforementioned range listed, please consider follow up with your Primary Care Provider.  If you are age 32 or younger, your body mass index should be between 19-25. Your Body mass index is 29.26 kg/m. If this is out of the aformentioned range listed, please consider follow up with your Primary Care Provider.   Due to recent changes in healthcare laws, you may see the results of your imaging and laboratory studies on MyChart before your provider has had a chance to review them.  We understand that in some cases there may be results that are confusing or concerning to you. Not all laboratory results come back in the same time frame and the provider may be waiting for multiple results in order to interpret others.  Please give [place] 48 hours in order for your provider to thoroughly review all the results before contacting the office for clarification of your results.   Due to recent COVID-19 restrictions implemented by our local and state authorities and in an effort to keep both patients and staff as safe as possible, our hospital system now requires COVID-19 testing prior to any scheduled hospital procedure. Please go to our [facility] location drive thru testing site ([address], [place], [place] [address]) on 01/25/2020 at  12:30pm. There will be multiple testing areas, the first checkpoint being for pre-procedure/surgery testing. Get into the right (yellow) lane that leads to the PAT testing team. You will not be billed at the time of testing but may receive a bill later  depending on your insurance. The approximate cost of the test is $100. You must agree to quarantine from the time of your testing until the procedure date on 01/29/2020. This should include staying at home with ONLY the people you live with. Avoid take-out, grocery store shopping or leaving the house for any non-emergent reason. Failure to have your COVID-19 test done on the date and time you have been scheduled will result in cancellation of procedure. Please call our office at [phone] if you have any questions.

## 2019-12-26 NOTE — Telephone Encounter (Signed)
   Tracey Graham [birth year] [id number]  Dear Dr Luciana Axe,  We have scheduled the above named patient for a(n) endoscopy procedure. Our records show that (s)he is on anticoagulation therapy.  Please advise as to whether the patient may come off their therapy of Eliquis 2 days prior to their procedure which is scheduled for 01/29/2020.  Please route your response to Harrel Lemon, CMA or fax response to [phone].  Sincerely,    [facility]

## 2019-12-26 NOTE — Progress Notes (Signed)
Referring Provider: Clinic, General Medical Primary Care Physician:  Verlon Au, MD  Chief complaint:  Hospital follow-up   IMPRESSION:  Non-bleeding gastric ulcers 10/24/19 likely due to ibuprofen    - no testing for H pylori performed LA Class C reflux esophagitis on EGD 10/24/19 RUQ pain was the presenting symptoms of GU and esophagitis Chronic constipation    -No significant stool burden seen on abdominal imaging studies 11/20 Normocytic anemia on labs November 2020 Eliquis for lower extremity DVT Wheelchair bound  Found to have gastric ulcers and reflux esophagitis during the evaluation for right upper quadrant pain.  Symptoms were initially improving on proton pump inhibitor.  Mother notes intermittent right upper quadrant pain recently.  EGD recommended to follow-up on the gastric ulcers, obtain biopsies for H. pylori, and document resolution of esophagitis.  Will defer endoscopic evaluation until she is able to hold her Eliquis.  She has follow-up with Dr. Leavy Cella in January to determine the endpoint for antiplatelet therapy.  Recent normocytic anemia may be related to gastric ulcers and esophagitis.  We will plan labs to clarify iron deficiency and anemia status.  Mother is satisfied with current management of chronic constipation.  She will continue to use Senokot and MiraLAX as needed.  I have asked her to contact me with any questions or concerns in the future.  PLAN: Continue omeprazole 20 mg BID Continue MiraLAX and Senkot PRN Hemoglobin, iron, ferritin EGD at the hospital after an Eliquis washout to follow-up on gastric ulcer, obtain H pylori biopsies  Please see the "Patient Instructions" section for addition details about the plan.  HPI: Tracey Graham is a 33 y.o. female who is seen in hospital follow-up. The history is obtained through her mother and review of her electronic health record. Patient with cerebral palsy, mother communicates for the  patient.  Wheelchair-bound, fully dependent for all ADLs.  S/p VP shunt 2014 to address hydrocephalus.  Also pull shunt revisions.  Seizures.  Intellectual disability.  Legally blind.  Speech disorder.  Chronic constipation. Left tibial vein, peritoneal vein DVT. On Lovenox as an inpatient, now Eliquis.   Patient chronic constipation for many years with a bowel movement every 1-3 days with the use of Senokot and MiraLAX. Meds are dosed PRN by her Mom to avoid diarrhea. Associated abdominal discomfort that improves with defecation.  Stool is intermittently green.    Reported a different, RUQ abdominal pain during recent hospitalization. EGD 10/25/19 with Dr. Meridee Score showed  LA Grade C esophagitis distally. Non-bleeding gastric ulcers with a clean ulcer base ([place] Class III). Erythematous mucosa in the gastric body and antrum. One gastric polyp - likely fundic gland. No gross lesions in the duodenal bulb, in the first portion of the duodenum and in the second portion of the duodenum. No biopsies obtained due to patient still being on active anticoagulation. Recommended to stop NSAIDs and repeat EGD in 3 months.  No H pylori testing was performed.   Returns today in scheduled hospital follow-up. Improving. Frequent eructation. Rare RUQ pain, although this may be occurring slightly more frequently.  No noted nausea or vomiting.  No change in bowel habits. She is no longer using ibuprofen. No pain medications have been needed.  No other associated symptoms. No identified exacerbating or relieving features.   Labs 11/12/19: WBC 8.3, hgb 10.5, MCV 82, RDW 15.9, platelets 310  Past Medical History:  Diagnosis Date  . Hydrocephalus ([facility])   . Seizures ([facility])   . VP (ventriculoperitoneal) shunt  status     Past Surgical History:  Procedure Laterality Date  . CARDIAC SURGERY    . ESOPHAGOGASTRODUODENOSCOPY (EGD) WITH PROPOFOL N/A 10/25/2019   Procedure: ESOPHAGOGASTRODUODENOSCOPY (EGD) WITH PROPOFOL;   Surgeon: Meridee ScoreMansouraty, Netty StarringGabriel Jr., MD;  Location: [facility] ENDOSCOPY;  Service: Gastroenterology;  Laterality: N/A;  . VENTRICULOPERITONEAL SHUNT      Current Outpatient Medications  Medication Sig Dispense Refill  . acetaminophen (TYLENOL) 160 MG/5ML liquid Take 500 mg by mouth every 4 (four) hours as needed for fever.    Marland Kitchen. apixaban (ELIQUIS) 5 MG TABS tablet Take 2 tablets (10mg ) twice daily for 7 days, then 1 tablet (5mg ) twice daily 60 tablet 0  . diphenhydrAMINE (BENYLIN) 12.5 MG/5ML syrup Take 10 mLs (25 mg total) by mouth every 4 (four) hours as needed for allergies. 120 mL 0  . omeprazole (PRILOSEC) 20 MG capsule Take 1 capsule (20 mg total) by mouth 2 (two) times daily before a meal. Open capsule and gently mix granules in acidic fruit juice.*Pour the mixture down the tube, flush tube with additional juice. Clamp tube for at least 1 hour 60 capsule 0  . PHENobarbital (LUMINAL) 97.2 MG tablet Take 97.2 mg by mouth at bedtime.     . polyethylene glycol (MIRALAX) packet Take 17 g by mouth daily. 5 each 0  . PRESCRIPTION MEDICATION Patient was seen 05/24/2019.  I agree with recommendation for Focus tilt in space wheelchair    . senna (SENOKOT) 8.6 MG tablet Take 1 tablet by mouth as needed for constipation.    . metoprolol tartrate (LOPRESSOR) 25 MG tablet Take 0.5 tablets (12.5 mg total) by mouth 2 (two) times daily. Hold HR <80 (Patient not taking: Reported on 12/26/2019) 30 tablet 0   No current facility-administered medications for this visit.    Allergies as of 12/26/2019 - Review Complete 12/26/2019  Allergen Reaction Noted  . Penicillins Swelling 01/05/2013    Family History  Problem Relation Age of Onset  . Hypertension Mother   . Hypertension Father   . Diabetes Father     Social History   Socioeconomic History  . Marital status: Single    Spouse name: Not on file  . Number of children: Not on file  . Years of education: Not on file  . Highest education level: Not on file   Occupational History  . Not on file  Tobacco Use  . Smoking status: Never Smoker  . Smokeless tobacco: Never Used  Substance and Sexual Activity  . Alcohol use: No  . Drug use: No  . Sexual activity: Never  Other Topics Concern  . Not on file  Social History Narrative  . Not on file   Social Determinants of Health   Financial Resource Strain:   . Difficulty of Paying Living Expenses: Not on file  Food Insecurity:   . Worried About Programme researcher, broadcasting/film/videounning Out of Food in the Last Year: Not on file  . Ran Out of Food in the Last Year: Not on file  Transportation Needs:   . Lack of Transportation (Medical): Not on file  . Lack of Transportation (Non-Medical): Not on file  Physical Activity:   . Days of Exercise per Week: Not on file  . Minutes of Exercise per Session: Not on file  Stress:   . Feeling of Stress : Not on file  Social Connections:   . Frequency of Communication with Friends and Family: Not on file  . Frequency of Social Gatherings with Friends and Family: Not on file  .  Attends Religious Services: Not on file  . Active Member of Clubs or Organizations: Not on file  . Attends Archivist Meetings: Not on file  . Marital Status: Not on file  Intimate Partner Violence:   . Fear of Current or Ex-Partner: Not on file  . Emotionally Abused: Not on file  . Physically Abused: Not on file  . Sexually Abused: Not on file    Review of Systems: 12 system ROS is negative except as noted above.   Physical Exam: General:   Alert,  well-nourished, pleasant and cooperative in NAD Head:  Normocephalic and atraumatic. Eyes:  Sclera clear, no icterus.   Conjunctiva pink. Ears:  Normal auditory acuity. Nose:  No deformity, discharge,  or lesions. Mouth:  No deformity or lesions.   Neck:  Supple; no masses or thyromegaly. Lungs:  Clear throughout to auscultation.   No wheezes. Heart:  Regular rate and rhythm; no murmurs. Abdomen:  Soft,nontender, nondistended, normal bowel  sounds, no rebound or guarding. No hepatosplenomegaly.   Rectal:  Deferred  Msk:  Symmetrical. No boney deformities LAD: No inguinal or umbilical LAD Extremities:  No clubbing or edema. Neurologic:  Alert and  oriented x4;  grossly nonfocal Skin:  Intact without significant lesions or rashes. Psych:  Alert and cooperative. Normal mood and affect.    Tracey Strike L. Tarri Glenn, MD, MPH 12/26/2019, 2:23 PM

## 2020-01-25 ENCOUNTER — Other Ambulatory Visit (HOSPITAL_COMMUNITY): Payer: Medicaid Other

## 2020-01-31 ENCOUNTER — Other Ambulatory Visit: Payer: Self-pay | Admitting: Emergency Medicine

## 2020-01-31 DIAGNOSIS — K259 Gastric ulcer, unspecified as acute or chronic, without hemorrhage or perforation: Secondary | ICD-10-CM

## 2020-01-31 DIAGNOSIS — D649 Anemia, unspecified: Secondary | ICD-10-CM

## 2020-02-01 ENCOUNTER — Other Ambulatory Visit (HOSPITAL_COMMUNITY): Payer: Medicaid Other

## 2020-02-05 ENCOUNTER — Encounter (HOSPITAL_COMMUNITY): Admission: RE | Payer: Self-pay | Source: Home / Self Care

## 2020-02-05 ENCOUNTER — Ambulatory Visit (HOSPITAL_COMMUNITY): Admit: 2020-02-05 | Payer: Medicaid Other | Admitting: Gastroenterology

## 2020-02-05 ENCOUNTER — Ambulatory Visit (HOSPITAL_COMMUNITY): Admission: RE | Admit: 2020-02-05 | Payer: Medicaid Other | Source: Home / Self Care | Admitting: Gastroenterology

## 2020-02-05 ENCOUNTER — Encounter (HOSPITAL_COMMUNITY): Payer: Self-pay

## 2020-02-05 SURGERY — ESOPHAGOGASTRODUODENOSCOPY (EGD) WITH PROPOFOL
Anesthesia: Monitor Anesthesia Care

## 2020-02-11 ENCOUNTER — Other Ambulatory Visit: Payer: Self-pay | Admitting: Emergency Medicine

## 2020-02-11 DIAGNOSIS — K259 Gastric ulcer, unspecified as acute or chronic, without hemorrhage or perforation: Secondary | ICD-10-CM

## 2020-02-11 DIAGNOSIS — D649 Anemia, unspecified: Secondary | ICD-10-CM

## 2020-02-11 DIAGNOSIS — K209 Esophagitis, unspecified without bleeding: Secondary | ICD-10-CM

## 2020-02-26 ENCOUNTER — Telehealth: Payer: Self-pay | Admitting: Gastroenterology

## 2020-02-26 ENCOUNTER — Other Ambulatory Visit: Payer: Self-pay

## 2020-02-26 ENCOUNTER — Emergency Department (HOSPITAL_COMMUNITY): Payer: Medicaid Other

## 2020-02-26 ENCOUNTER — Emergency Department (HOSPITAL_COMMUNITY)
Admission: EM | Admit: 2020-02-26 | Discharge: 2020-02-26 | Disposition: A | Discharge status: Home / Self Care | Payer: Medicaid Other | Attending: Emergency Medicine | Admitting: Emergency Medicine

## 2020-02-26 DIAGNOSIS — R1084 Generalized abdominal pain: Secondary | ICD-10-CM | POA: Diagnosis not present

## 2020-02-26 DIAGNOSIS — Z79899 Other long term (current) drug therapy: Secondary | ICD-10-CM | POA: Insufficient documentation

## 2020-02-26 DIAGNOSIS — R109 Unspecified abdominal pain: Secondary | ICD-10-CM | POA: Diagnosis present

## 2020-02-26 LAB — CBC WITH DIFFERENTIAL/PLATELET
Abs Immature Granulocytes: 0.03 10*3/uL (ref 0.00–0.07)
Basophils Absolute: 0 10*3/uL (ref 0.0–0.1)
Basophils Relative: 0 %
Eosinophils Absolute: 0.1 10*3/uL (ref 0.0–0.5)
Eosinophils Relative: 1 %
HCT: 42.7 % (ref 36.0–46.0)
Hemoglobin: 14.3 g/dL (ref 12.0–15.0)
Immature Granulocytes: 0 %
Lymphocytes Relative: 24 %
Lymphs Abs: 1.7 10*3/uL (ref 0.7–4.0)
MCH: 24.9 pg — ABNORMAL LOW (ref 26.0–34.0)
MCHC: 33.5 g/dL (ref 30.0–36.0)
MCV: 74.3 fL — ABNORMAL LOW (ref 80.0–100.0)
Monocytes Absolute: 0.7 10*3/uL (ref 0.1–1.0)
Monocytes Relative: 9 %
Neutro Abs: 4.6 10*3/uL (ref 1.7–7.7)
Neutrophils Relative %: 66 %
Platelets: 189 10*3/uL (ref 150–400)
RBC: 5.75 MIL/uL — ABNORMAL HIGH (ref 3.87–5.11)
RDW: 16.1 % — ABNORMAL HIGH (ref 11.5–15.5)
WBC: 7.1 10*3/uL (ref 4.0–10.5)
nRBC: 0 % (ref 0.0–0.2)

## 2020-02-26 LAB — COMPREHENSIVE METABOLIC PANEL
ALT: 48 U/L — ABNORMAL HIGH (ref 0–44)
AST: 38 U/L (ref 15–41)
Albumin: 3.9 g/dL (ref 3.5–5.0)
Alkaline Phosphatase: 129 U/L — ABNORMAL HIGH (ref 38–126)
Anion gap: 12 (ref 5–15)
BUN: 6 mg/dL (ref 6–20)
CO2: 24 mmol/L (ref 22–32)
Calcium: 8.7 mg/dL — ABNORMAL LOW (ref 8.9–10.3)
Chloride: 100 mmol/L (ref 98–111)
Creatinine, Ser: 0.55 mg/dL (ref 0.44–1.00)
GFR calc Af Amer: >60 mL/min (ref >60)
GFR calc non Af Amer: >60 mL/min (ref >60)
Glucose, Bld: 94 mg/dL (ref 70–99)
Potassium: 3.9 mmol/L (ref 3.5–5.1)
Sodium: 136 mmol/L (ref 135–145)
Total Bilirubin: 0.8 mg/dL (ref 0.3–1.2)
Total Protein: 8.1 g/dL (ref 6.5–8.1)

## 2020-02-26 LAB — LIPASE, BLOOD: Lipase: 15 U/L (ref 11–51)

## 2020-02-26 MED ORDER — SODIUM CHLORIDE (PF) 0.9 % IJ SOLN
INTRAMUSCULAR | Status: AC
  Filled 2020-02-26: qty 50

## 2020-02-26 MED ORDER — IOHEXOL 300 MG/ML  SOLN
100.0000 mL | Freq: Once | INTRAMUSCULAR | Status: AC | PRN
  Administered 2020-02-26: 100 mL via INTRAVENOUS

## 2020-02-26 NOTE — ED Provider Notes (Signed)
[facility]-EMERGENCY DEPT Provider Note   CSN: [id number] Arrival date & time: 02/26/20  0753     History Chief Complaint  Patient presents with  . Abdominal Pain    Tracey Graham is a 33 y.o. female.  Patient with a history of cerebral palsy.  VP shunt and recently has had difficulty with gastric ulcers.  Has a follow-up upper endoscopy scheduled by gastroenterology on March 9.  Patient currently taking Prilosec.  Started complaining of abdominal pain started around February 26 patient vomited once on Friday has not vomited since.  Pain seems to be getting worse.  Is eating but intake is a little bit less than usual.        Past Medical History:  Diagnosis Date  . Hydrocephalus (HCC)   . Seizures (HCC)   . VP (ventriculoperitoneal) shunt status     Patient Active Problem List   Diagnosis Date Noted  . Hypoxia   . Sinus tachycardia 11/12/2019  . Multiple gastric ulcers 11/12/2019  . AMS (altered mental status)   . Abdominal pain   . Nuchal rigidity   . Aseptic meningitis   . Constipation   . Deep vein thrombosis (DVT) of femoral vein of left lower extremity (HCC)   . Infection of VP (ventriculoperitoneal) shunt (HCC) 10/13/2019  . Hydrocephalus (HCC)   . VP (ventriculoperitoneal) shunt status   . Encephalopathy     Past Surgical History:  Procedure Laterality Date  . CARDIAC SURGERY    . ESOPHAGOGASTRODUODENOSCOPY (EGD) WITH PROPOFOL N/A 10/25/2019   Procedure: ESOPHAGOGASTRODUODENOSCOPY (EGD) WITH PROPOFOL;  Surgeon: Mansouraty, Gabriel Jr., MD;  Location: [facility] ENDOSCOPY;  Service: Gastroenterology;  Laterality: N/A;  . VENTRICULOPERITONEAL SHUNT       OB History   No obstetric history on file.     Family History  Problem Relation Age of Onset  . Hypertension Mother   . Hypertension Father   . Diabetes Father     Social History   Tobacco Use  . Smoking status: Never Smoker  . Smokeless tobacco: Never Used  Substance Use  Topics  . Alcohol use: No  . Drug use: No    Home Medications Prior to Admission medications   Medication Sig Start Date End Date Taking? Authorizing Provider  acetaminophen (TYLENOL) 160 MG/5ML liquid Take 500 mg by mouth every 4 (four) hours as needed for fever.   Yes [provider]  diphenhydrAMINE (BENYLIN) 12.5 MG/5ML syrup Take 10 mLs (25 mg total) by mouth every 4 (four) hours as needed for allergies. 10/19/16  Yes Knott, Daniel, MD  omeprazole (PRILOSEC) 20 MG capsule Take 1 capsule (20 mg total) by mouth 2 (two) times daily before a meal. Open capsule and gently mix granules in acidic fruit juice.*Pour the mixture down the tube, flush tube with additional juice. Clamp tube for at least 1 hour 11/12/19  Yes Kamineni, Neelima, MD  PHENobarbital (LUMINAL) 97.2 MG tablet Take 97.2 mg by mouth at bedtime.    Yes [provider]  polyethylene glycol (MIRALAX) packet Take 17 g by mouth daily. Patient taking differently: Take 17 g by mouth daily as needed for mild constipation or moderate constipation.  01/06/13  Yes Schinlever, Catherine, PA-C  senna (SENOKOT) 8.6 MG tablet Take 1 tablet by mouth as needed for constipation.   Yes [provider]  apixaban (ELIQUIS) 5 MG TABS tablet Take 2 tablets (10mg) twice daily for 7 days, then 1 tablet (5mg) twice daily Patient not taking: Reported on 02/26/2020   10/23/19   Kayleen Memos, DO  metoprolol tartrate (LOPRESSOR) 25 MG tablet Take 0.5 tablets (12.5 mg total) by mouth 2 (two) times daily. Hold HR <80 Patient not taking: Reported on 12/26/2019 11/12/19 12/26/19  Guilford Shi, MD  PRESCRIPTION MEDICATION Patient was seen 05/24/2019.  I agree with recommendation for Focus tilt in space wheelchair 10/23/19   [provider]    Allergies    Penicillins  Review of Systems   Review of Systems  Unable to perform ROS: Mental status change    Physical Exam Updated Vital Signs BP (!) 160/95 (BP Location:  Right Arm)   Pulse 72   Temp 99.6 F (37.6 C) (Axillary)   Resp 20   LMP 01/30/2020 (Approximate)   SpO2 98%   Physical Exam Vitals and nursing note reviewed.  Constitutional:      General: She is not in acute distress.    Appearance: Normal appearance. She is well-developed.  HENT:     Head: Normocephalic and atraumatic.  Eyes:     Conjunctiva/sclera: Conjunctivae normal.  Cardiovascular:     Rate and Rhythm: Normal rate and regular rhythm.     Heart sounds: No murmur.  Pulmonary:     Effort: Pulmonary effort is normal. No respiratory distress.     Breath sounds: Normal breath sounds.  Abdominal:     Palpations: Abdomen is soft.     Tenderness: There is abdominal tenderness.     Comments: Generalized tenderness but difficult to assess.  Musculoskeletal:     Cervical back: Neck supple.  Skin:    General: Skin is warm and dry.  Neurological:     Mental Status: She is alert. Mental status is at baseline.     Comments: Alert     ED Results / Procedures / Treatments   Labs (all labs ordered are listed, but only abnormal results are displayed) Labs Reviewed  CBC WITH DIFFERENTIAL/PLATELET - Abnormal; Notable for the following components:      Result Value   RBC 5.75 (*)    MCV 74.3 (*)    MCH 24.9 (*)    RDW 16.1 (*)    All other components within normal limits  COMPREHENSIVE METABOLIC PANEL - Abnormal; Notable for the following components:   Calcium 8.7 (*)    ALT 48 (*)    Alkaline Phosphatase 129 (*)    All other components within normal limits  LIPASE, BLOOD    EKG None  Radiology CT Abdomen Pelvis W Contrast  Result Date: 02/26/2020 CLINICAL DATA:  Right lower quadrant abdominal pain EXAM: CT ABDOMEN AND PELVIS WITH CONTRAST TECHNIQUE: Multidetector CT imaging of the abdomen and pelvis was performed using the standard protocol following bolus administration of intravenous contrast. CONTRAST:  16m OMNIPAQUE IOHEXOL 300 MG/ML  SOLN COMPARISON:  11/03/2019  FINDINGS: Lower chest: No acute abnormality. Hepatobiliary: No focal liver abnormality is seen. No gallstones, gallbladder wall thickening, or biliary dilatation. Pancreas: Unremarkable. No pancreatic ductal dilatation or surrounding inflammatory changes. Spleen: Normal in size without focal abnormality. Adrenals/Urinary Tract: Adrenal glands are unremarkable. Kidneys are normal, without renal calculi, focal lesion, or hydronephrosis. Bladder is unremarkable. Stomach/Bowel: Stomach is within normal limits. Appendix appears normal (series 2, images 33-35). No evidence of bowel wall thickening, distention, or inflammatory changes. Vascular/Lymphatic: No significant vascular findings are present. No enlarged abdominal or pelvic lymph nodes. Reproductive: Uterus and bilateral adnexa are unremarkable. Other: Ventriculoperitoneal shunt catheter tubing with a small volume of free fluid within the anterior aspect of  the pelvis. Trace fluid in the cul-de-sac. No well-defined or rim enhancing fluid collections. No free intraperitoneal air. Musculoskeletal: No acute or significant osseous findings. IMPRESSION: 1. No acute abdominopelvic findings.  Normal appendix. 2. Small volume of free fluid within the pelvis, likely related to VP shunt tubing. 3. No well-defined or rim enhancing fluid collections. Electronically Signed   By: Nicholas  Plundo D.O.   On: 02/26/2020 12:44    Procedures Procedures (including critical care time)  Medications Ordered in ED Medications  sodium chloride (PF) 0.9 % injection (has no administration in time range)  iohexol (OMNIPAQUE) 300 MG/ML solution 100 mL (100 mLs Intravenous Contrast Given 02/26/20 1200)    ED Course  I have reviewed the triage vital signs and the nursing notes.  Pertinent labs & imaging results that were available during my care of the patient were reviewed by me and considered in my medical decision making (see chart for details).    MDM Rules/Calculators/A&P                       Work-up here in the emergency department without any significant findings.  No leukocytosis.  No anemia.  Electrolytes without significant abnormalities.  No evidence of any dehydration.  Liver function test just with an elevated alk phos.  Symptoms in the face of a negative CT scan may very well be persistent gastric ulcer problem.  She has an upper endoscopy scheduled for March 9.  I have the patient's mother contact gastroenterology to see if they want to make any adjustments on her meds.   Final Clinical Impression(s) / ED Diagnoses Final diagnoses:  Generalized abdominal pain    Rx / DC Orders ED Discharge Orders    None       Zackowski, Scott, MD 02/26/20 1329  

## 2020-02-26 NOTE — Telephone Encounter (Signed)
Patients mom is calling- stating that patient has been sick since Friday- with abdominal pain. She went to the ED and had a CT scan. They stated that the CT scan did not show anything that could be wrong- they think it is still ulcers. Asking what they can do.

## 2020-02-26 NOTE — Discharge Instructions (Addendum)
Follow back up with gastroenterology.  To see if they want to change any of her medications.  Today CT scan of the abdomen without any acute findings.  Also labs without any significant abnormalities.  It will be important for her to have her upper endoscopy done as scheduled on March 9.

## 2020-02-26 NOTE — ED Notes (Signed)
Main phlebotomy called regarding need for lab draw. Stated they will be here shortly.

## 2020-02-26 NOTE — ED Triage Notes (Signed)
Pt coming from home with her mother whom she lives with with complaints of abdominal pain. Mom is caregiver. Mother states the pain started on Friday 2/26. Pt vomited once on Friday and has since been able to keep intake down although intake has been decreased.  Mother states that pt has been crying about her stomach pain. Pt has a hx of stomach ulcers and has been taking medication. Pt is scheduled to have an endoscopy done on 3/9.

## 2020-02-27 NOTE — Telephone Encounter (Signed)
Thank you for scheduling the EGD. She should increase her omeprazole to 40 mg BID. Thank you.

## 2020-02-27 NOTE — Telephone Encounter (Signed)
Patient's mother calling to follow up on previous message.

## 2020-02-27 NOTE — Telephone Encounter (Signed)
EGD scheduled at [facility] on 3/9, increased and worsening abd pain, taking 20 mg BID omeprazole (not helping), mother requesting new prescription or increased dose prior to EGD. Please advise?

## 2020-02-27 NOTE — Telephone Encounter (Signed)
Patient's mother notified of the medication dosage change and also the need to take on an empty stomach 30-60 minutes prior to eating.

## 2020-02-29 ENCOUNTER — Other Ambulatory Visit (HOSPITAL_COMMUNITY)
Admission: RE | Admit: 2020-02-29 | Discharge: 2020-02-29 | Disposition: A | Discharge status: Home / Self Care | Payer: Medicaid Other | Source: Ambulatory Visit | Attending: Gastroenterology | Admitting: Gastroenterology

## 2020-02-29 DIAGNOSIS — Z01812 Encounter for preprocedural laboratory examination: Secondary | ICD-10-CM | POA: Diagnosis not present

## 2020-02-29 DIAGNOSIS — Z20822 Contact with and (suspected) exposure to covid-19: Secondary | ICD-10-CM | POA: Diagnosis not present

## 2020-02-29 LAB — SARS CORONAVIRUS 2 (TAT 6-24 HRS): SARS Coronavirus 2: NEGATIVE

## 2020-03-04 ENCOUNTER — Encounter (HOSPITAL_COMMUNITY): Payer: Self-pay | Admitting: Gastroenterology

## 2020-03-04 ENCOUNTER — Ambulatory Visit (HOSPITAL_COMMUNITY): Payer: Medicaid Other | Admitting: Anesthesiology

## 2020-03-04 ENCOUNTER — Encounter (HOSPITAL_COMMUNITY)
Admission: RE | Disposition: A | Discharge status: Home / Self Care | Payer: Self-pay | Source: Home / Self Care | Attending: Gastroenterology

## 2020-03-04 ENCOUNTER — Other Ambulatory Visit: Payer: Self-pay

## 2020-03-04 ENCOUNTER — Ambulatory Visit (HOSPITAL_COMMUNITY)
Admission: RE | Admit: 2020-03-04 | Discharge: 2020-03-04 | Disposition: A | Discharge status: Home / Self Care | Payer: Medicaid Other | Attending: Gastroenterology | Admitting: Gastroenterology

## 2020-03-04 ENCOUNTER — Ambulatory Visit (HOSPITAL_COMMUNITY): Admit: 2020-03-04 | Payer: Medicaid Other | Admitting: Gastroenterology

## 2020-03-04 ENCOUNTER — Encounter (HOSPITAL_COMMUNITY): Payer: Self-pay

## 2020-03-04 DIAGNOSIS — K5909 Other constipation: Secondary | ICD-10-CM | POA: Diagnosis not present

## 2020-03-04 DIAGNOSIS — K2100 Gastro-esophageal reflux disease with esophagitis, without bleeding: Secondary | ICD-10-CM | POA: Insufficient documentation

## 2020-03-04 DIAGNOSIS — Z7901 Long term (current) use of anticoagulants: Secondary | ICD-10-CM | POA: Insufficient documentation

## 2020-03-04 DIAGNOSIS — Z8719 Personal history of other diseases of the digestive system: Secondary | ICD-10-CM | POA: Insufficient documentation

## 2020-03-04 DIAGNOSIS — H548 Legal blindness, as defined in USA: Secondary | ICD-10-CM | POA: Insufficient documentation

## 2020-03-04 DIAGNOSIS — Z8711 Personal history of peptic ulcer disease: Secondary | ICD-10-CM | POA: Diagnosis not present

## 2020-03-04 DIAGNOSIS — Z86718 Personal history of other venous thrombosis and embolism: Secondary | ICD-10-CM | POA: Insufficient documentation

## 2020-03-04 DIAGNOSIS — Z09 Encounter for follow-up examination after completed treatment for conditions other than malignant neoplasm: Secondary | ICD-10-CM | POA: Insufficient documentation

## 2020-03-04 DIAGNOSIS — R1011 Right upper quadrant pain: Secondary | ICD-10-CM | POA: Insufficient documentation

## 2020-03-04 DIAGNOSIS — F79 Unspecified intellectual disabilities: Secondary | ICD-10-CM | POA: Insufficient documentation

## 2020-03-04 DIAGNOSIS — Z993 Dependence on wheelchair: Secondary | ICD-10-CM | POA: Insufficient documentation

## 2020-03-04 DIAGNOSIS — G919 Hydrocephalus, unspecified: Secondary | ICD-10-CM | POA: Diagnosis not present

## 2020-03-04 DIAGNOSIS — Z982 Presence of cerebrospinal fluid drainage device: Secondary | ICD-10-CM | POA: Diagnosis not present

## 2020-03-04 DIAGNOSIS — G809 Cerebral palsy, unspecified: Secondary | ICD-10-CM | POA: Insufficient documentation

## 2020-03-04 SURGERY — CANCELLED PROCEDURE
Anesthesia: Monitor Anesthesia Care

## 2020-03-04 SURGERY — ESOPHAGOGASTRODUODENOSCOPY (EGD) WITH PROPOFOL
Anesthesia: Monitor Anesthesia Care

## 2020-03-04 MED ORDER — ONDANSETRON HCL 4 MG/2ML IJ SOLN
INTRAMUSCULAR | Status: DC | PRN
  Administered 2020-03-04: 4 mg via INTRAVENOUS

## 2020-03-04 MED ORDER — LIDOCAINE HCL (CARDIAC) PF 100 MG/5ML IV SOSY
PREFILLED_SYRINGE | INTRAVENOUS | Status: DC | PRN
  Administered 2020-03-04: 80 mg via INTRAVENOUS

## 2020-03-04 MED ORDER — PROPOFOL 500 MG/50ML IV EMUL
INTRAVENOUS | Status: DC | PRN
  Administered 2020-03-04: 135 ug/kg/min via INTRAVENOUS

## 2020-03-04 MED ORDER — PROPOFOL 10 MG/ML IV BOLUS
INTRAVENOUS | Status: AC
  Filled 2020-03-04: qty 20

## 2020-03-04 MED ORDER — LACTATED RINGERS IV SOLN: INTRAVENOUS | Status: DC

## 2020-03-04 MED ORDER — SODIUM CHLORIDE 0.9 % IV SOLN: INTRAVENOUS | Status: DC

## 2020-03-04 SURGICAL SUPPLY — 14 items

## 2020-03-04 NOTE — Anesthesia Preprocedure Evaluation (Addendum)
Anesthesia Evaluation  Patient identified by MRN, date of birth, ID band Patient awake    Reviewed: Allergy & Precautions, NPO status , Patient's Chart, lab work & pertinent test results  History of Anesthesia Complications (+) DIFFICULT IV STICK / SPECIAL LINE and history of anesthetic complications  Airway Mallampati: III  TM Distance: >3 FB Neck ROM: Limited    Dental  (+) Poor Dentition, Dental Advisory Given   Pulmonary neg pulmonary ROS,    Pulmonary exam normal breath sounds clear to auscultation       Cardiovascular + DVT  Normal cardiovascular exam Rhythm:Regular Rate:Normal  Hx DVT LLE   Neuro/Psych Seizures -, Well Controlled,  Hydrocephalus s/p VPS CP  Last seizure 10 yrs ago negative psych ROS   GI/Hepatic Neg liver ROS, PUD, Recurrent gastric ulcers, anemia, chronic abdominal pain   Endo/Other  negative endocrine ROS  Renal/GU negative Renal ROS  negative genitourinary   Musculoskeletal negative musculoskeletal ROS (+)   Abdominal (+) + obese,   Peds negative pediatric ROS (+)  Hematology negative hematology ROS (+) hct 42.7   Anesthesia Other Findings   Reproductive/Obstetrics negative OB ROS                            Anesthesia Physical  Anesthesia Plan  ASA: III  Anesthesia Plan: MAC   Post-op Pain Management:    Induction:   PONV Risk Score and Plan: 2 and Propofol infusion and TIVA  Airway Management Planned: Natural Airway and Nasal Cannula  Additional Equipment: None  Intra-op Plan:   Post-operative Plan:   Informed Consent: I have reviewed the patients History and Physical, chart, labs and discussed the procedure including the risks, benefits and alternatives for the proposed anesthesia with the patient or authorized representative who has indicated his/her understanding and acceptance.       Plan Discussed with: CRNA  Anesthesia Plan  Comments:         Anesthesia Quick Evaluation

## 2020-03-04 NOTE — H&P (Signed)
Referring Provider: No ref. provider found Primary Care Physician:  Verlon Au, MD  Chief complaint:  Here for EGD   IMPRESSION:  Non-bleeding gastric ulcers 10/24/19 likely due to ibuprofen    - no testing for H pylori performed LA Class C reflux esophagitis on EGD 10/24/19 RUQ pain was the presenting symptoms of GU and esophagitis Chronic constipation    -No significant stool burden seen on abdominal imaging studies 11/20 Normocytic anemia on labs November 2020 Eliquis for lower extremity DVT Wheelchair bound  Found to have gastric ulcers and reflux esophagitis during the evaluation for right upper quadrant pain.  Symptoms were initially improving on proton pump inhibitor.  Mother notes intermittent right upper quadrant pain recently.  EGD recommended to follow-up on the gastric ulcers, obtain biopsies for H. pylori, and document resolution of esophagitis.   Recent normocytic anemia may be related to gastric ulcers and esophagitis.  We will plan labs to clarify iron deficiency and anemia status.  PLAN: EGD to follow-up on gastric ulcer, obtain H pylori biopsies  Please see the "Patient Instructions" section for addition details about the plan.  HPI: Tracey Graham is a 34 y.o. female who is seen in hospital follow-up. The history is obtained through her mother and review of her electronic health record. Patient with cerebral palsy, mother communicates for the patient.  Wheelchair-bound, fully dependent for all ADLs.  S/p VP shunt 2014 to address hydrocephalus.  Also pull shunt revisions.  Seizures.  Intellectual disability.  Legally blind.  Speech disorder.  Chronic constipation. Left tibial vein, peritoneal vein DVT. On Lovenox as an inpatient, now Eliquis.   Patient chronic constipation for many years with a bowel movement every 1-3 days with the use of Senokot and MiraLAX. Meds are dosed PRN by her Mom to avoid diarrhea. Associated abdominal discomfort that improves  with defecation.  Stool is intermittently green.    Reported a different, RUQ abdominal pain during recent hospitalization. EGD 10/25/19 with Dr. Meridee Score showed  LA Grade C esophagitis distally. Non-bleeding gastric ulcers with a clean ulcer base (Forrest Class III). Erythematous mucosa in the gastric body and antrum. One gastric polyp - likely fundic gland. No gross lesions in the duodenal bulb, in the first portion of the duodenum and in the second portion of the duodenum. No biopsies obtained due to patient still being on active anticoagulation. Recommended to stop NSAIDs and repeat EGD in 3 months.  No H pylori testing was performed.   Returns today in scheduled hospital follow-up. Improving. Frequent eructation. Rare RUQ pain, although this may be occurring slightly more frequently.  No noted nausea or vomiting.  No change in bowel habits. She is no longer using ibuprofen. No pain medications have been needed.  No other associated symptoms. No identified exacerbating or relieving features.   Labs 11/12/19: WBC 8.3, hgb 10.5, MCV 82, RDW 15.9, platelets 310  Past Medical History:  Diagnosis Date  . Hydrocephalus (HCC)   . Seizures (HCC)   . VP (ventriculoperitoneal) shunt status     Past Surgical History:  Procedure Laterality Date  . CARDIAC SURGERY    . ESOPHAGOGASTRODUODENOSCOPY (EGD) WITH PROPOFOL N/A 10/25/2019   Procedure: ESOPHAGOGASTRODUODENOSCOPY (EGD) WITH PROPOFOL;  Surgeon: Meridee Score Netty Starring., MD;  Location: [facility] ENDOSCOPY;  Service: Gastroenterology;  Laterality: N/A;  . VENTRICULOPERITONEAL SHUNT      Current Facility-Administered Medications  Medication Dose Route Frequency Provider Last Rate Last Admin  . 0.9 %  sodium chloride infusion   Intravenous Continuous  Thornton Park, MD        Allergies as of 02/11/2020 - Review Complete 12/26/2019  Allergen Reaction Noted  . Penicillins Swelling 01/05/2013    Family History  Problem Relation Age of Onset  .  Hypertension Mother   . Hypertension Father   . Diabetes Father     Social History   Socioeconomic History  . Marital status: Single    Spouse name: Not on file  . Number of children: Not on file  . Years of education: Not on file  . Highest education level: Not on file  Occupational History  . Not on file  Tobacco Use  . Smoking status: Never Smoker  . Smokeless tobacco: Never Used  Substance and Sexual Activity  . Alcohol use: No  . Drug use: No  . Sexual activity: Never  Other Topics Concern  . Not on file  Social History Narrative  . Not on file   Social Determinants of Health   Financial Resource Strain:   . Difficulty of Paying Living Expenses: Not on file  Food Insecurity:   . Worried About Charity fundraiser in the Last Year: Not on file  . Ran Out of Food in the Last Year: Not on file  Transportation Needs:   . Lack of Transportation (Medical): Not on file  . Lack of Transportation (Non-Medical): Not on file  Physical Activity:   . Days of Exercise per Week: Not on file  . Minutes of Exercise per Session: Not on file  Stress:   . Feeling of Stress : Not on file  Social Connections:   . Frequency of Communication with Friends and Family: Not on file  . Frequency of Social Gatherings with Friends and Family: Not on file  . Attends Religious Services: Not on file  . Active Member of Clubs or Organizations: Not on file  . Attends Archivist Meetings: Not on file  . Marital Status: Not on file  Intimate Partner Violence:   . Fear of Current or Ex-Partner: Not on file  . Emotionally Abused: Not on file  . Physically Abused: Not on file  . Sexually Abused: Not on file     Physical Exam: General:   Alert,  well-nourished, pleasant and cooperative in NAD Head:  Normocephalic and atraumatic. Eyes:  Sclera clear, no icterus.   Conjunctiva pink. Ears:  Normal auditory acuity. Nose:  No deformity, discharge,  or lesions. Mouth:  No deformity or  lesions.   Neck:  Supple; no masses or thyromegaly. Lungs:  Clear throughout to auscultation.   No wheezes. Heart:  Regular rate and rhythm; no murmurs. Abdomen:  Soft,nontender, nondistended, normal bowel sounds, no rebound or guarding. No hepatosplenomegaly.   Rectal:  Deferred  Msk:  Symmetrical. No boney deformities LAD: No inguinal or umbilical LAD Extremities:  No clubbing or edema. Neurologic:  Alert and  oriented x4;  grossly nonfocal Skin:  Intact without significant lesions or rashes. Psych:  Alert and cooperative. Normal mood and affect.    Jazmaine Fuelling L. Tarri Glenn, MD, MPH 03/04/2020, 9:43 AM

## 2020-03-04 NOTE — Transfer of Care (Addendum)
Immediate Anesthesia Transfer of Care Note  Patient: Tracey Graham  Procedure(s) Performed: Procedure(s): ESOPHAGOGASTRODUODENOSCOPY (EGD) WITH PROPOFOL (N/A)  Patient Location: PACU  Anesthesia Type:MAC  Level of Consciousness:  sedated, patient cooperative and responds to stimulation  Airway & Oxygen Therapy:Patient Spontanous Breathing and Patient connected to face mask oxgen  Post-op Assessment:  Report given to PACU RN and Post -op Vital signs reviewed and stable  Post vital signs:  Reviewed and stable  Last Vitals:  Vitals:   03/04/20 0956  BP: (!) 164/110  Pulse: 81  Resp: (!) 9  Temp: 36.7 C  SpO2: 11%    Complications: No apparent anesthesia complications

## 2020-03-04 NOTE — Anesthesia Postprocedure Evaluation (Signed)
Anesthesia Post Note  Patient: Tracey Graham  Procedure(s) Performed: ESOPHAGOGASTRODUODENOSCOPY (EGD) WITH PROPOFOL (N/A )     Comments: IV infiltrated, unable to place another IV despite multiple attempts with ultrasound guidance. Recommend PICC in future for all procedures    Last Vitals:  Vitals:   03/04/20 1140 03/04/20 1150  BP: (!) 159/116 (!) 159/88  Pulse: 79 76  Resp: 14 11  Temp:    SpO2: 100% 100%    Last Pain:  Vitals:   03/04/20 1150  TempSrc:   PainSc: 0-No pain                 Lannie Fields

## 2020-03-04 NOTE — Progress Notes (Signed)
Unable to perform EGD as previously planned due to lack of IV access. Will need PICC if endoscopy is needed in the future. Plan UGI series and H pylori breath test in place of the EGD at this time.

## 2020-03-05 ENCOUNTER — Other Ambulatory Visit: Payer: Self-pay

## 2020-03-05 ENCOUNTER — Telehealth: Payer: Self-pay | Admitting: Gastroenterology

## 2020-03-05 DIAGNOSIS — K259 Gastric ulcer, unspecified as acute or chronic, without hemorrhage or perforation: Secondary | ICD-10-CM

## 2020-03-05 NOTE — Telephone Encounter (Signed)
Spoke with pts mother and she is aware, orders in epic. She knows to hold omeprazole for 2 weeks prior to test.

## 2020-03-05 NOTE — Telephone Encounter (Signed)
Tracey Danas, MD   Unable to proceed with EGD today due to lack of IV access.  Please arrange for UGI series and H pylori breath test ASAP to follow-up on her gastric ulcers. She has recently been in the ED and I would like to get some answers before her symptoms escalate again. Thank you.    Pt scheduled for upper GI at [facility] 03/17/20@10 :30am, pt to arrive there at 10:15am. Pt to be NPO after midnight. Mother aware of appt and instructions.  Discussed urea breath test with pts mother and she states pt is disabled and she does not think she will be able to do this, not sure she can breathe out as required, she does not know how to blow up a balloon.  Please advise.

## 2020-03-05 NOTE — Telephone Encounter (Signed)
Patient's mother called stated her daughter was not able to proceed with procedure yesterday due to IV and she is calling to follow up.

## 2020-03-05 NOTE — Telephone Encounter (Signed)
Tracey Graham is off all week. Please see note below regarding breath test as advise.

## 2020-03-05 NOTE — Telephone Encounter (Signed)
MyChart message sent to [facility] yesterday to arrange these studies as outlined in that note. Please schedule if she has not done so.

## 2020-03-05 NOTE — Telephone Encounter (Signed)
Okay. Please schedule H pylori stool antigen test. Thank you.

## 2020-03-17 ENCOUNTER — Other Ambulatory Visit: Payer: Self-pay

## 2020-03-17 ENCOUNTER — Ambulatory Visit (HOSPITAL_COMMUNITY)
Admission: RE | Admit: 2020-03-17 | Discharge: 2020-03-17 | Disposition: A | Discharge status: Home / Self Care | Payer: Medicaid Other | Source: Ambulatory Visit | Attending: Gastroenterology | Admitting: Gastroenterology

## 2020-03-17 DIAGNOSIS — K259 Gastric ulcer, unspecified as acute or chronic, without hemorrhage or perforation: Secondary | ICD-10-CM | POA: Diagnosis not present

## 2020-03-25 ENCOUNTER — Other Ambulatory Visit: Payer: Medicaid Other

## 2020-03-25 DIAGNOSIS — K259 Gastric ulcer, unspecified as acute or chronic, without hemorrhage or perforation: Secondary | ICD-10-CM

## 2020-03-26 LAB — HELICOBACTER PYLORI  SPECIAL ANTIGEN
MICRO NUMBER:: 10307471
SPECIMEN QUALITY: ADEQUATE

## 2020-04-04 IMAGING — CT CT ABD-PELV W/ CM
2 of 4 series · 15 of 46 positions shown, 17 images · IV contrast (APPLIED)
Comparison: 10/26/2019

CLINICAL DATA: Abdominal pain, fever, abscess suspected

EXAM:
CT ABDOMEN AND PELVIS WITH CONTRAST
TECHNIQUE: Multidetector CT imaging of the abdomen and pelvis was performed
using the standard protocol following bolus administration of
intravenous contrast.
CONTRAST:  100mL OMNIPAQUE IOHEXOL 300 MG/ML SOLN, additional oral
enteric contrast

[Series 3: abdomen 5.0 · axial · 0.80mm/px · z∈[-358,+52]mm · 12 of 94 slices shown, 14 images]
[im 6/94  soft-tissue]
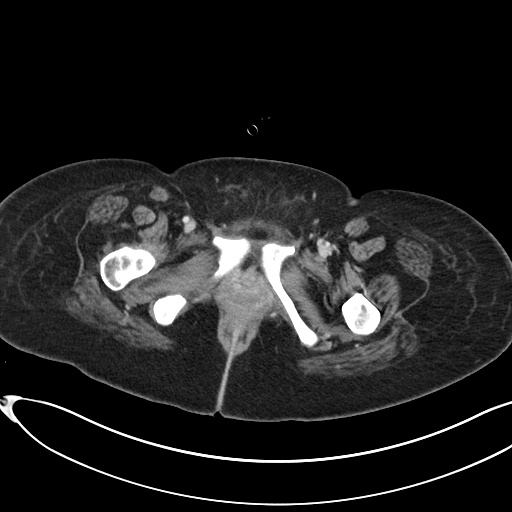
[im 6/94  bone]
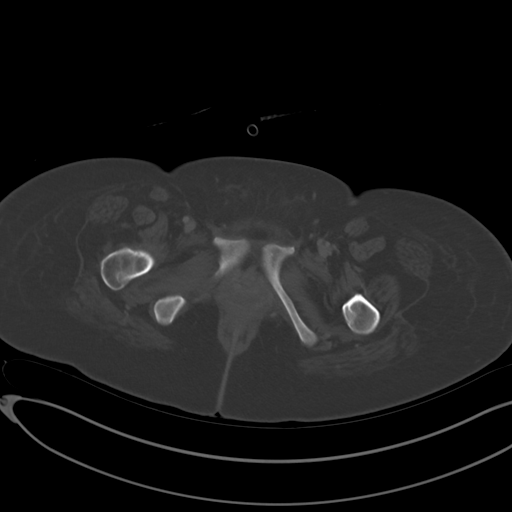
[im 17/94  soft-tissue]
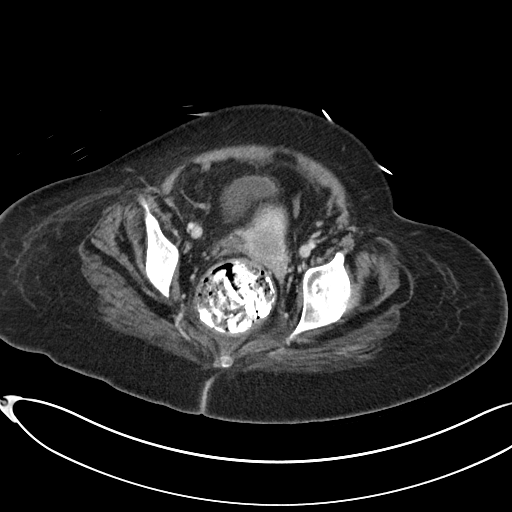
[im 22/94  soft-tissue]
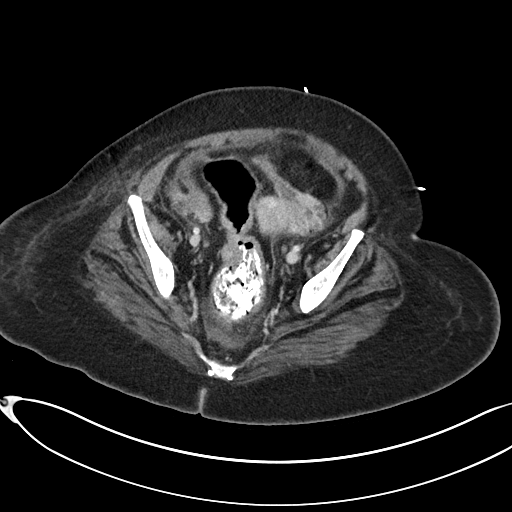
[im 28/94  soft-tissue]
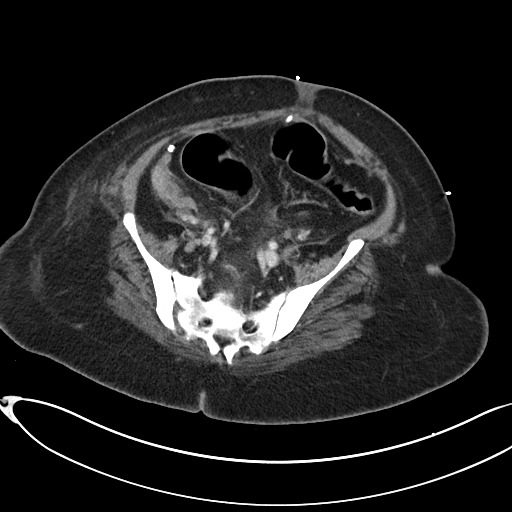
[im 39/94  soft-tissue]
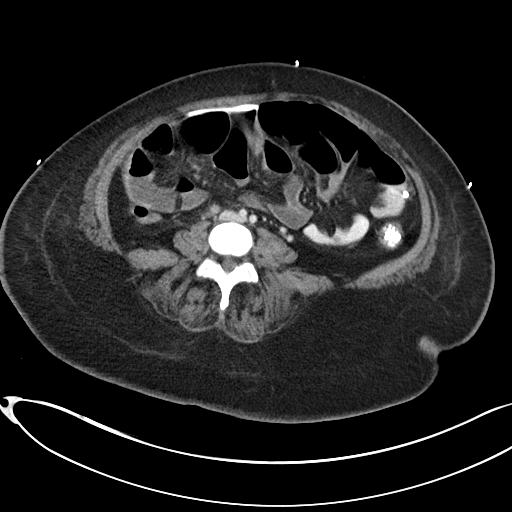
[im 44/94  soft-tissue]
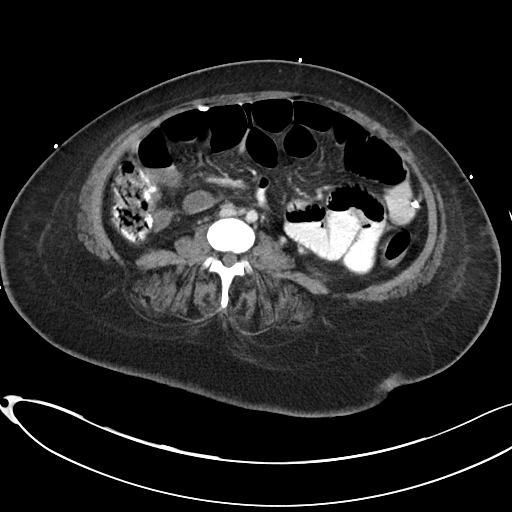
[im 50/94  soft-tissue]
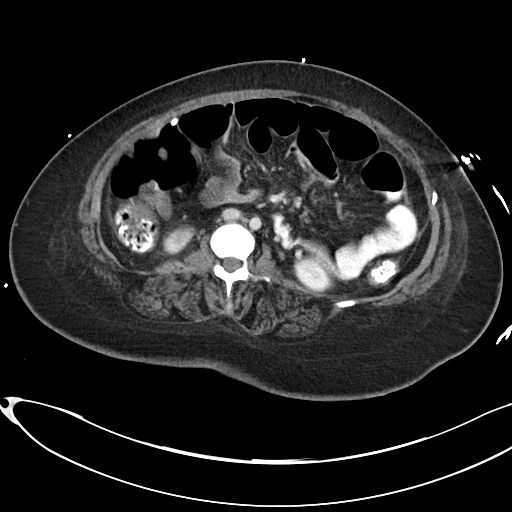
[im 61/94  soft-tissue]
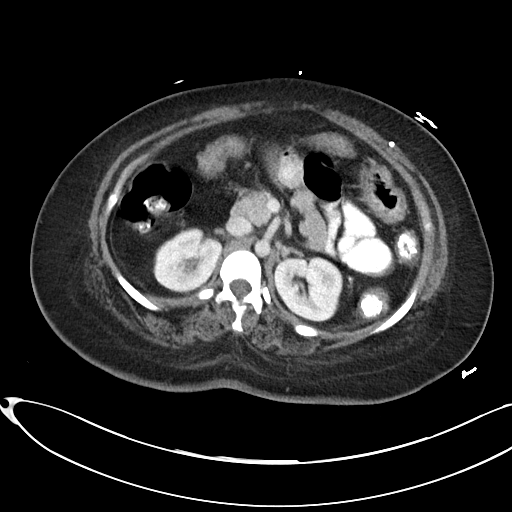
[im 66/94  soft-tissue]
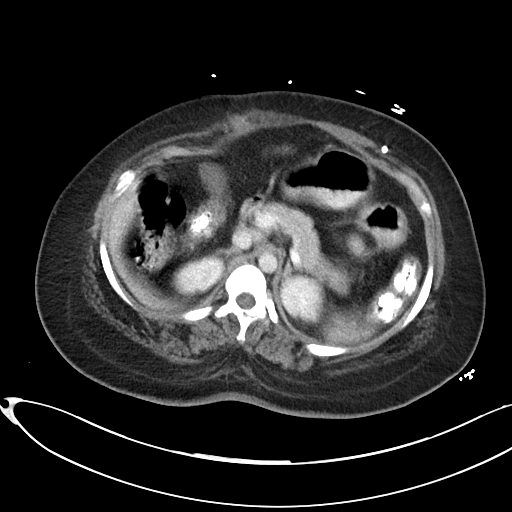
[im 66/94  bone]
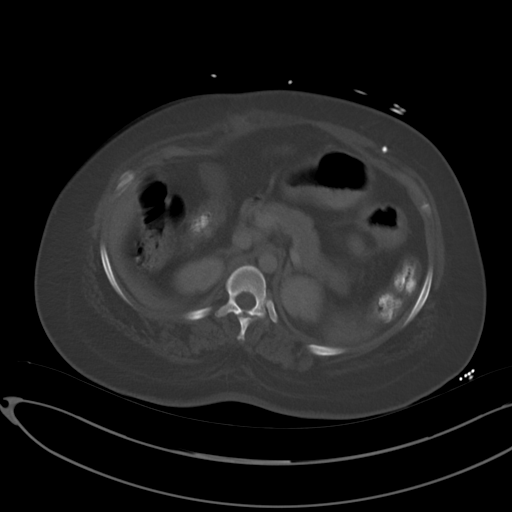
[im 72/94  soft-tissue]
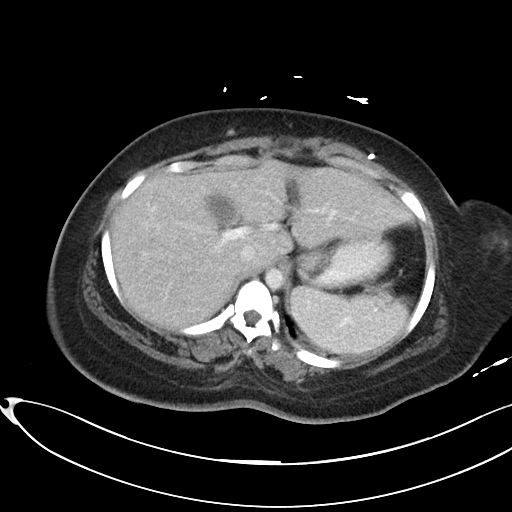
[im 83/94  soft-tissue]
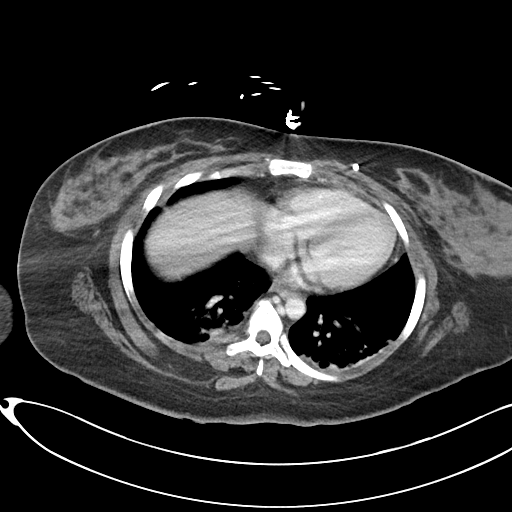
[im 88/94  soft-tissue]
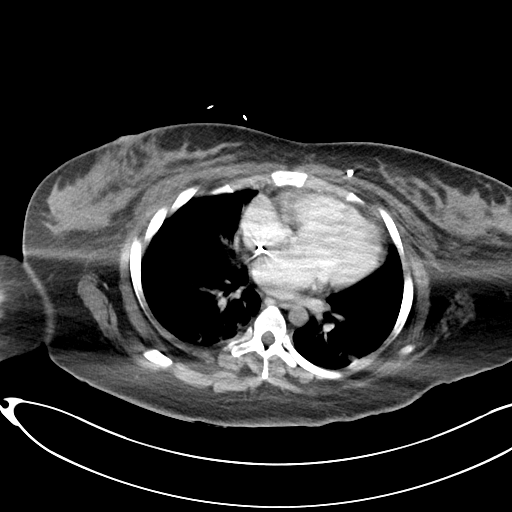

[Series 6: abdomen 3.0 mpr cor · coronal · 0.86mm/px · 3 of 103 slices shown]
[im 35/103  soft-tissue]
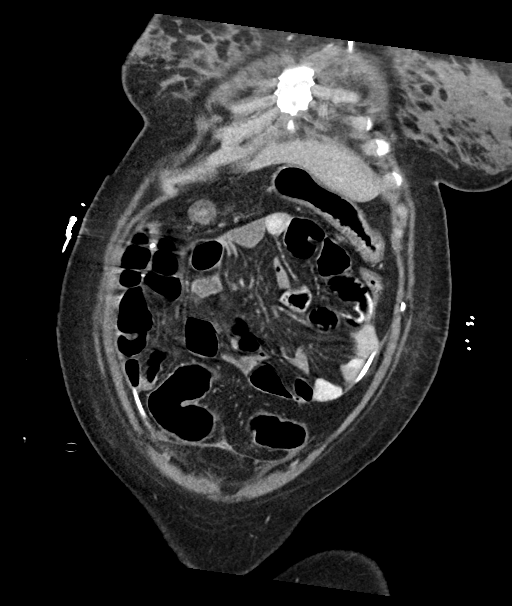
[im 46/103  soft-tissue]
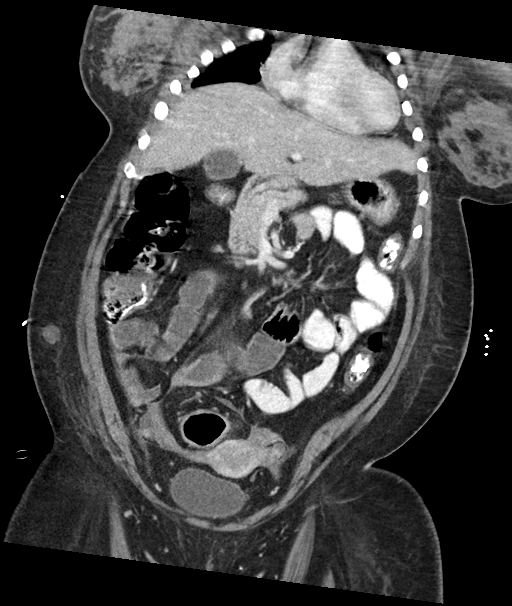
[im 57/103  soft-tissue]
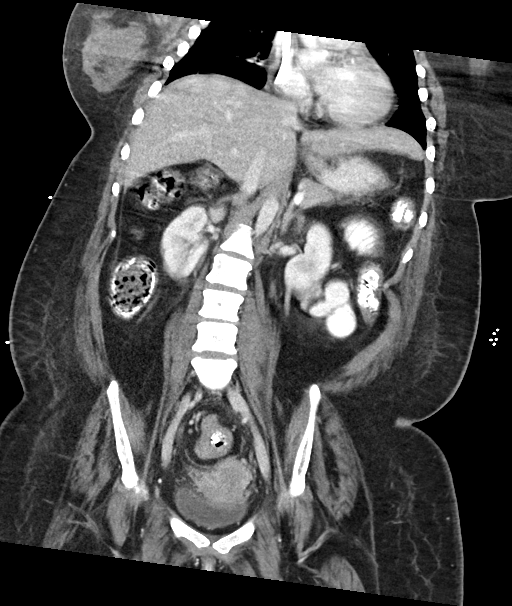

[15 of 46 positions shown; findings below may reference images not displayed]

FINDINGS: Lower chest: Bibasilar atelectasis or consolidation, similar to
prior examination. Trace bilateral pleural effusions.

Hepatobiliary: No solid liver abnormality is seen. No gallstones,
gallbladder wall thickening, or biliary dilatation.

Pancreas: Unremarkable. No pancreatic ductal dilatation or
surrounding inflammatory changes.

Spleen: Normal in size without significant abnormality.

Adrenals/Urinary Tract: Adrenal glands are unremarkable. Kidneys are
normal, without renal calculi, solid lesion, or hydronephrosis.
Bladder is unremarkable.

Stomach/Bowel: Stomach is within normal limits. Appendix appears
normal. Thickened, inflamed appearance of the distal colon and
rectum is improved compared to prior examination.

Vascular/Lymphatic: No significant vascular findings are present. No
enlarged abdominal or pelvic lymph nodes.

Reproductive: No mass or other significant abnormality.

Other: No abdominal wall hernia or abnormality. There are two
segments of intra-abdominal shunt catheter tubing, one tip
positioned in the lower right hemiabdomen (series 3, image 70) and
one positioned in the upper right hemiabdomen anterior to the cecum
(series 3, image 41). Of note, the superior catheter tip is mobile
when compared to prior examination, and was previously seen to rest
within a fluid collection, which has since resolved or
redistributed. There remains a small volume of ascites about the
abdomen and pelvis.

Musculoskeletal: No acute or significant osseous findings.
IMPRESSION: 1. There are two segments of intra-abdominal shunt catheter tubing,
one tip positioned in the lower right hemiabdomen (series 3, image
70) and one positioned in the upper right hemiabdomen anterior to
the cecum (series 3, image 41). Of note, the superior catheter tip
is mobile when compared to prior examination, and was previously
seen to rest within a fluid collection, which has since resolved or
redistributed. There remains a small volume of ascites about the
abdomen and pelvis.

2. Bibasilar atelectasis or consolidation, similar to prior
examination. Trace bilateral pleural effusions. Further concerning
for infection or aspiration.

3. Thickened, inflamed appearance of the distal colon and rectum is
improved compared to prior examination, consistent with resolving
infectious or inflammatory colitis.

## 2020-06-19 ENCOUNTER — Ambulatory Visit (INDEPENDENT_AMBULATORY_CARE_PROVIDER_SITE_OTHER): Payer: Medicaid Other | Admitting: Nurse Practitioner

## 2020-06-19 ENCOUNTER — Other Ambulatory Visit: Payer: Medicaid Other

## 2020-06-19 ENCOUNTER — Other Ambulatory Visit (INDEPENDENT_AMBULATORY_CARE_PROVIDER_SITE_OTHER): Payer: Medicaid Other

## 2020-06-19 VITALS — BP 116/74 | HR 65 | Ht 64.0 in | Wt 157.0 lb

## 2020-06-19 DIAGNOSIS — R1031 Right lower quadrant pain: Secondary | ICD-10-CM | POA: Diagnosis not present

## 2020-06-19 DIAGNOSIS — Z8669 Personal history of other diseases of the nervous system and sense organs: Secondary | ICD-10-CM | POA: Diagnosis not present

## 2020-06-19 LAB — CBC WITH DIFFERENTIAL/PLATELET
Basophils Absolute: 0 10*3/uL (ref 0.0–0.1)
Basophils Relative: 1.1 % (ref 0.0–3.0)
Eosinophils Absolute: 0.1 10*3/uL (ref 0.0–0.7)
Eosinophils Relative: 1.1 % (ref 0.0–5.0)
HCT: 40.7 % (ref 36.0–46.0)
Hemoglobin: 14 g/dL (ref 12.0–15.0)
Lymphocytes Relative: 30.6 % (ref 12.0–46.0)
Lymphs Abs: 1.4 10*3/uL (ref 0.7–4.0)
MCHC: 34.3 g/dL (ref 30.0–36.0)
MCV: 76.7 fl — ABNORMAL LOW (ref 78.0–100.0)
Monocytes Absolute: 0.3 10*3/uL (ref 0.1–1.0)
Monocytes Relative: 7.5 % (ref 3.0–12.0)
Neutro Abs: 2.8 10*3/uL (ref 1.4–7.7)
Neutrophils Relative %: 59.7 % (ref 43.0–77.0)
Platelets: 223 10*3/uL (ref 150.0–400.0)
RBC: 5.31 Mil/uL — ABNORMAL HIGH (ref 3.87–5.11)
RDW: 18.3 % — ABNORMAL HIGH (ref 11.5–15.5)
WBC: 4.6 10*3/uL (ref 4.0–10.5)

## 2020-06-19 LAB — COMPREHENSIVE METABOLIC PANEL
ALT: 16 U/L (ref 0–35)
AST: 13 U/L (ref 0–37)
Albumin: 4.3 g/dL (ref 3.5–5.2)
Alkaline Phosphatase: 131 U/L — ABNORMAL HIGH (ref 39–117)
BUN: 8 mg/dL (ref 6–23)
CO2: 27 mEq/L (ref 19–32)
Calcium: 9.1 mg/dL (ref 8.4–10.5)
Chloride: 104 mEq/L (ref 96–112)
Creatinine, Ser: 0.54 mg/dL (ref 0.40–1.20)
GFR: 156.17 mL/min (ref >60.00)
Glucose, Bld: 83 mg/dL (ref 70–99)
Potassium: 4.4 mEq/L (ref 3.5–5.1)
Sodium: 138 mEq/L (ref 135–145)
Total Bilirubin: 0.2 mg/dL (ref 0.2–1.2)
Total Protein: 7.6 g/dL (ref 6.0–8.3)

## 2020-06-19 LAB — C-REACTIVE PROTEIN: CRP: <1 mg/dL (ref 0.5–20.0)

## 2020-06-19 NOTE — Progress Notes (Signed)
Reviewed and agree with management plans. ? ?Jarica Plass L. Mary-Ann Pennella, MD, MPH  ?

## 2020-06-19 NOTE — Patient Instructions (Addendum)
   Scheduled on 06/25/2020 at 3:00pm. You should arrive 15 minutes prior to your appointment time for registration. Please follow the written instructions below on the day of your exam:  WARNING: IF YOU ARE ALLERGIC TO IODINE/X-RAY DYE, PLEASE NOTIFY RADIOLOGY IMMEDIATELY AT [phone]! YOU WILL BE GIVEN A 13 HOUR PREMEDICATION PREP.  1) Do not eat or drink anything after 11:00am (4 hours prior to your test) 2) You have been given 2 bottles of oral contrast to drink. The solution may taste better if refrigerated, but do NOT add ice or any other liquid to this solution. Shake well before drinking.    Drink 1 bottle of contrast @ 1:00pm (2 hours prior to your exam)  Drink 1 bottle of contrast @ 2:00pm (1 hour prior to your exam)  You may take any medications as prescribed with a small amount of water, if necessary. If you take any of the following medications: METFORMIN, GLUCOPHAGE, GLUCOVANCE, AVANDAMET, RIOMET, FORTAMET, [place] MET, JANUMET, GLUMETZA or METAGLIP, you MAY be asked to HOLD this medication 48 hours AFTER the exam.  The purpose of you drinking the oral contrast is to aid in the visualization of your intestinal tract. The contrast solution may cause some diarrhea. Depending on your individual set of symptoms, you may also receive an intravenous injection of x-ray contrast/dye. Plan on being at [facility] for 30 minutes or longer, depending on the type of exam you are having performed.  This test typically takes 30-45 minutes to complete.  If you have any questions regarding your exam or if you need to reschedule, you may call the CT department at [phone] between the hours of 8:00 am and 5:00 pm, Monday-Friday.  ________________________________________________________________________ Your provider has requested that you go to the basement level for lab work before leaving today. Press "B" on the elevator. The lab is located at the first door on the left as you exit the  elevator.   Due to recent changes in healthcare laws, you may see the results of your imaging and laboratory studies on MyChart before your provider has had a chance to review them.  We understand that in some cases there may be results that are confusing or concerning to you. Not all laboratory results come back in the same time frame and the provider may be waiting for multiple results in order to interpret others.  Please give [place] 48 hours in order for your provider to thoroughly review all the results before contacting the office for clarification of your results.   Thank you for choosing [facility] Noralyn Pick, CRNP

## 2020-06-19 NOTE — Progress Notes (Signed)
06/19/2020 Tracey Graham [id number] 09-03-1986   Chief Complaint: Right mid to RLQ pain   History of Present Illness: Tracey Graham is a 34 year old female with cerebral palsy, seizure disorder, she is mostly nonverbal, legally blind and wheelchair bound s/p VP shunt for hydrocephalus in 2014.  Left tibial vein, peritoneal vein DVT previously treated with Lovenox then Eliquis which has been discontinued.  Her mother and father communicate for her.  She is  accompanied by her mother and father today for further evaluation regarding right lower abdominal pain.  Her mother stated for the past week when she changes Tracey Graham's diaper she grimaces with pain when her right mid to lower quadrant is touched. However, no noticeable right abdominal pain for the past 2 days. No nausea or vomiting.  She stated Tracey Graham's eating pattern has not changed.  She has chronic constipation for which she takes MiraLAX every other day and Activa yogurt with fiber once daily.  Her mother reports she passes a soft brown to green stool once or twice daily. No rectal bleeding or black stool. Her mother stated her current RLQ pain seems similar to the abdominal pain  Tracey Graham experienced when she was hospitalized for with sepsis/aseptic meningitis, VP shunt fluid collection and abdominal pain October 2020. An EGD was done 10/25/2019 during her hospital admission by Dr. Meridee Score which identified grade C esophagitis distally, a nonbleeding gastric ulcers with a clean base (most likely due to NSAID use) and one gastric polyp was identified.  The duodenum was normal.  Biopsies were not obtained as the patient was on active anticoagulation.  A repeat EGD was recommended in 3 months.  She was scheduled for an EGD on 03/05/19 1 at [facility] with Dr. Orvan Falconer.  However, IV access was not obtained therefore the EGD was canceled.  If a repeat EGD was attempted the patient would most likely require a PICC line.  Omeprazole was  stopped for 2 weeks and H. pylori stool antigen testing was done 03/25/2020 which was was negative.  An upper GI series was completed 03/17/2020 out evidence of a gastric ulcer.  Omeprazole was not restarted as her UGI did not show any evidence of an ulcer.  No recent NSAID use.   Upper GI series 03/17/2020: Limited single contrast evaluation, as above. No large gastric ulcer is evident within the gastric antrum. Duodenal bulb is within normal limits.  Abdominal/pelvic CT with contrast 02/26/2020: 1. No acute abdominopelvic findings.  Normal appendix. 2. Small volume of free fluid within the pelvis, likely related to VP shunt tubing. 3. No well-defined or rim enhancing fluid collections.  Abdominal/Pelvic CT 10/26/2019: 1. Findings concerning for multifocal pneumonia. Clinical correlation and follow-up to resolution recommended. 2. A 5.6 x 2.5 cm loculated appearing fluid collection adjacent to the tip of the VP shunt in the left anterior pelvis anterior to the uterus and the left ovary. There is apparent enhancement of the adjacent mesentery concerning for possible superimposed infection. Clinical correlation is recommended. 3. Mild thickened appearance of the distal colon and rectosigmoid likely related to inflammatory changes of the pelvis. No bowel obstruction. Normal appendix.   EGD 10/25/2019 by Dr. Caleb Popp [facility]: - No gross lesions in esophagus proximally. LA Grade C esophagitis distally. - Non-bleeding gastric ulcers with a clean ulcer base (Forrest Class III). Erythematous mucosa in the gastric body and antrum. One gastric polyp - likely fundic gland. - No gross lesions in the duodenal bulb, in the first portion of the  duodenum and in the second portion of the duodenum. - No biopsies obtained due to patient still being on active anticoagulation.     Current Medications, Allergies, Past Medical History, Past Surgical History, Family History and Social  History were reviewed in [employer] record.  Current Outpatient Medications on File Prior to Visit  Medication Sig Dispense Refill  . acetaminophen (TYLENOL) 160 MG/5ML liquid Take 500 mg by mouth every 4 (four) hours as needed for fever.    Marland Kitchen PHENobarbital (LUMINAL) 97.2 MG tablet Take 97.2 mg by mouth at bedtime.     . polyethylene glycol (MIRALAX) packet Take 17 g by mouth daily. (Patient taking differently: Take 17 g by mouth daily as needed for mild constipation or moderate constipation. ) 5 each 0   No current facility-administered medications on file prior to visit.    Allergies  Allergen Reactions  . Penicillins Swelling    Did not have reaction to 5d of amoxicillin Has patient had a PCN reaction causing immediate rash, facial/tongue/throat swelling, SOB or lightheadedness with hypotension: Yes Has patient had a PCN reaction causing severe rash involving mucus membranes or skin necrosis: No Has patient had a PCN reaction that required hospitalization No Has patient had a PCN reaction occurring within the last 10 years: No If all of the above answers are "NO", then may proceed with Cephalosporin use.      Physical Exam: Ht 5\' 4"  (1.626 m)   Wt 157 lb (71.2 kg) Comment: verbal  BMI 26.95 kg/m  General: 34 year old female with CP presents in a wheel chair in NAD.  Head: Normocephalic and atraumatic. Eyes: No scleral icterus.  Ears: Normal auditory acuity. Mouth:  No ulcers or lesions.  Lungs: Clear throughout to auscultation. Heart: Regular rate and rhythm, no murmur. Abdomen: Soft, nontender, No facial grimacing or sings of rebound or guarding with palpation. Nondistended. Right abdomen adipose > left. No masses or hepatomegaly. Normal bowel sounds x 4 quadrants. Multiple upper abdominal scars. Father transferred patient from the wheelchair to the exam table without difficulty. Rectal: Deferred.  Musculoskeletal: UE with CP  contractures. Extremities: No edema. Neurological: Audible vocal sounds, moves all extremities.   Psychological: Alert and cooperative. Normal mood and affect  Assessment and Recommendations:  90. 35 year old female with CP, s/p VP shunt for hydrocephalus with history of  nonbleeding gastric ulcers 09/2019 presents today for further evaluation for right mid to RLQ pain as assessed by her mother and father. Today, abdominal exam without obvious tenderness or rebound/guarding. Mother and father are concerned that Lidia is having the same abdominal pain she experienced when she was diagnosed with a stomach ulcer.  -Abd/pelvic CT with oral and IV contrast to rule out abdominal/pelvic infectious/infammatory process/VP shunt fluid collection  -CBC and CMP prior to CT -No evidence of right mid to RLQ abdominal pain as assessed by her parents for the past 2 days.  -Consider restarting PPI if abdominal pain recurs.  -Parents will call our office if Lora's abdominal pain recurs  -No plans for repeat EGD at this time.  -Further recommendations to be determined after the above evaluation completed.

## 2020-06-20 ENCOUNTER — Other Ambulatory Visit (INDEPENDENT_AMBULATORY_CARE_PROVIDER_SITE_OTHER): Payer: Medicaid Other

## 2020-06-20 ENCOUNTER — Telehealth: Payer: Self-pay | Admitting: General Surgery

## 2020-06-20 DIAGNOSIS — D649 Anemia, unspecified: Secondary | ICD-10-CM

## 2020-06-20 DIAGNOSIS — R1031 Right lower quadrant pain: Secondary | ICD-10-CM

## 2020-06-20 LAB — GAMMA GT: GGT: 32 U/L (ref 7–51)

## 2020-06-20 NOTE — Telephone Encounter (Signed)
Need to have the patient return to the lab to have a alkaline phosphatase isoenzyme and a ggt. The lab is able to add on the ggt but not the alkaline phos isoenzyme. Add on sheet sent to lab for ggt.

## 2020-06-20 NOTE — Telephone Encounter (Signed)
-----  Message from Noralyn Pick, NP sent at 06/19/2020  8:44 PM EDT ----- Olivia Mackie, pls contact the lab and see if they can add alk phos isoenzyme and GGT level to these labs. Dx elevated alk phos level.  Further recommendations to be determined after CTAP results received.

## 2020-06-25 ENCOUNTER — Telehealth: Payer: Self-pay | Admitting: General Surgery

## 2020-06-25 ENCOUNTER — Other Ambulatory Visit: Payer: Self-pay

## 2020-06-25 ENCOUNTER — Ambulatory Visit (HOSPITAL_COMMUNITY)
Admission: RE | Admit: 2020-06-25 | Discharge: 2020-06-25 | Disposition: A | Discharge status: Home / Self Care | Payer: Medicaid Other | Source: Ambulatory Visit | Attending: Nurse Practitioner | Admitting: Nurse Practitioner

## 2020-06-25 ENCOUNTER — Ambulatory Visit (HOSPITAL_COMMUNITY): Payer: Medicaid Other

## 2020-06-25 ENCOUNTER — Other Ambulatory Visit: Payer: Self-pay | Admitting: Nurse Practitioner

## 2020-06-25 DIAGNOSIS — Z8669 Personal history of other diseases of the nervous system and sense organs: Secondary | ICD-10-CM

## 2020-06-25 DIAGNOSIS — R1031 Right lower quadrant pain: Secondary | ICD-10-CM | POA: Diagnosis present

## 2020-06-25 MED ORDER — SODIUM CHLORIDE (PF) 0.9 % IJ SOLN
INTRAMUSCULAR | Status: AC
  Filled 2020-06-25: qty 50

## 2020-06-25 NOTE — Telephone Encounter (Signed)
Called to advise [place] they are unable to get an IV for the patients CT. They have called in the IV team but they are unsure if they will be able to get there in time. The patients mother may want to leave before. Per colleen as long as the patient was able to drink her contrast we can forgo the IV if needed. Tech verbalized understanding

## 2020-07-03 ENCOUNTER — Other Ambulatory Visit (HOSPITAL_COMMUNITY): Payer: Medicaid Other

## 2020-09-11 ENCOUNTER — Other Ambulatory Visit: Payer: Self-pay | Admitting: Family Medicine

## 2021-02-04 ENCOUNTER — Other Ambulatory Visit: Payer: Self-pay

## 2021-02-04 ENCOUNTER — Encounter: Payer: Self-pay | Admitting: Pulmonary Disease

## 2021-02-04 ENCOUNTER — Ambulatory Visit (INDEPENDENT_AMBULATORY_CARE_PROVIDER_SITE_OTHER): Payer: Medicaid Other | Admitting: Pulmonary Disease

## 2021-02-04 VITALS — BP 110/90 | HR 82 | Temp 97.0°F | Ht 64.0 in | Wt 160.0 lb

## 2021-02-04 DIAGNOSIS — R0683 Snoring: Secondary | ICD-10-CM | POA: Diagnosis not present

## 2021-02-04 NOTE — Patient Instructions (Signed)
Moderate probability of obstructive sleep apnea  There will be significant challenges to obtaining a home sleep study  An in lab study will be more appropriate but this will be very challenging as well  Treatment which will likely include use of a CPAP will be very challenging if she will not be able to keep anything on her face without putting up a fight  Weight loss efforts with diet  Sleeping with the head of the bed elevated as much as possible may help snoring number of events   A study can always be considered however this has to be balanced with reasonable expectations of if we will be able to treat her sleep apnea successfully   Sleep Apnea Sleep apnea affects breathing during sleep. It causes breathing to stop for a short time or to become shallow. It can also increase the risk of:  Heart attack.  Stroke.  Being very overweight (obese).  Diabetes.  Heart failure.  Irregular heartbeat. The goal of treatment is to help you breathe normally again. What are the causes? There are three kinds of sleep apnea:  Obstructive sleep apnea. This is caused by a blocked or collapsed airway.  Central sleep apnea. This happens when the brain does not send the right signals to the muscles that control breathing.  Mixed sleep apnea. This is a combination of obstructive and central sleep apnea. The most common cause of this condition is a collapsed or blocked airway. This can happen if:  Your throat muscles are too relaxed.  Your tongue and tonsils are too large.  You are overweight.  Your airway is too small.   What increases the risk?  Being overweight.  Smoking.  Having a small airway.  Being older.  Being female.  Drinking alcohol.  Taking medicines to calm yourself (sedatives or tranquilizers).  Having family members with the condition. What are the signs or symptoms?  Trouble staying asleep.  Being sleepy or tired during the day.  Getting angry a  lot.  Loud snoring.  Headaches in the morning.  Not being able to focus your mind (concentrate).  Forgetting things.  Less interest in sex.  Mood swings.  Personality changes.  Feelings of sadness (depression).  Waking up a lot during the night to pee (urinate).  Dry mouth.  Sore throat. How is this diagnosed?  Your medical history.  A physical exam.  A test that is done when you are sleeping (sleep study). The test is most often done in a sleep lab but may also be done at home. How is this treated?  Sleeping on your side.  Using a medicine to get rid of mucus in your nose (decongestant).  Avoiding the use of alcohol, medicines to help you relax, or certain pain medicines (narcotics).  Losing weight, if needed.  Changing your diet.  Not smoking.  Using a machine to open your airway while you sleep, such as: ? An oral appliance. This is a mouthpiece that shifts your lower jaw forward. ? A CPAP device. This device blows air through a mask when you breathe out (exhale). ? An EPAP device. This has valves that you put in each nostril. ? A BPAP device. This device blows air through a mask when you breathe in (inhale) and breathe out.  Having surgery if other treatments do not work. It is important to get treatment for sleep apnea. Without treatment, it can lead to:  High blood pressure.  Coronary artery disease.  In men, not  being able to have an erection (impotence).  Reduced thinking ability.   Follow these instructions at home: Lifestyle  Make changes that your doctor recommends.  Eat a healthy diet.  Lose weight if needed.  Avoid alcohol, medicines to help you relax, and some pain medicines.  Do not use any products that contain nicotine or tobacco, such as cigarettes, e-cigarettes, and chewing tobacco. If you need help quitting, ask your doctor. General instructions  Take over-the-counter and prescription medicines only as told by your  doctor.  If you were given a machine to use while you sleep, use it only as told by your doctor.  If you are having surgery, make sure to tell your doctor you have sleep apnea. You may need to bring your device with you.  Keep all follow-up visits as told by your doctor. This is important. Contact a doctor if:  The machine that you were given to use during sleep bothers you or does not seem to be working.  You do not get better.  You get worse. Get help right away if:  Your chest hurts.  You have trouble breathing in enough air.  You have an uncomfortable feeling in your back, arms, or stomach.  You have trouble talking.  One side of your body feels weak.  A part of your face is hanging down. These symptoms may be an emergency. Do not wait to see if the symptoms will go away. Get medical help right away. Call your local emergency services (911 in the [place] Do not drive yourself to the hospital. Summary  This condition affects breathing during sleep.  The most common cause is a collapsed or blocked airway.  The goal of treatment is to help you breathe normally while you sleep. This information is not intended to replace advice given to you by your health care provider. Make sure you discuss any questions you have with your health care provider. Document Revised: 09/29/2018 Document Reviewed: 08/08/2018 Elsevier Patient Education  2021 [employer].

## 2021-02-04 NOTE — Progress Notes (Signed)
Tracey Graham    [id number]    06-Mar-1986  Primary Care Physician:Boyd, Leanora Cover, MD  Referring Physician: Verlon Au, MD [address] [place],  [place] [address]  Chief complaint:   Young lady with a history of cerebral palsy Accompanied by mom, concern for obstructive sleep apnea  HPI:  Snoring, witnessed apneas  Mom had concerns about a year ago and was considering evaluation but did not follow through and recently she recorded snoring  Patient does require assistance with activities She is not ambulatory  We did discuss options of evaluation which will include an in lab polysomnogram, we also did discuss the option of a home sleep study.  Both studies will be fraught with the fact that [name] will not likely leave any devices on her face or attached to without putting up a fight. We also did discuss the difficulties that one may encounter with trying to treat sleep apnea if we are not able to get her cooperation.  She usually sleeps on her stomach Gets quite fidgety  She does have a history of seizures -Has not had seizures in a while  She usually does not fall asleep easily during the day if she is stimulated She will take a nap if left alone during the day  Outpatient Encounter Medications as of 02/04/2021  Medication Sig  . acetaminophen (TYLENOL) 160 MG/5ML liquid Take 500 mg by mouth every 4 (four) hours as needed for fever.  Marland Kitchen PHENobarbital (LUMINAL) 97.2 MG tablet Take 97.2 mg by mouth at bedtime.   . polyethylene glycol (MIRALAX) packet Take 17 g by mouth daily. (Patient taking differently: Take 17 g by mouth daily as needed for mild constipation or moderate constipation.)   No facility-administered encounter medications on file as of 02/04/2021.    Allergies as of 02/04/2021 - Review Complete 02/04/2021  Allergen Reaction Noted  . Penicillins Swelling 01/05/2013    Past Medical History:  Diagnosis Date  . DVT  (deep venous thrombosis) (HCC)   . Hydrocephalus (HCC)   . Seizures (HCC)   . VP (ventriculoperitoneal) shunt status     Past Surgical History:  Procedure Laterality Date  . CARDIAC SURGERY    . ESOPHAGOGASTRODUODENOSCOPY (EGD) WITH PROPOFOL N/A 10/25/2019   Procedure: ESOPHAGOGASTRODUODENOSCOPY (EGD) WITH PROPOFOL;  Surgeon: Meridee Score Netty Starring., MD;  Location: [facility] ENDOSCOPY;  Service: Gastroenterology;  Laterality: N/A;  . VENTRICULOPERITONEAL SHUNT      Family History  Problem Relation Age of Onset  . Hypertension Mother   . Hypertension Father   . Diabetes Father     Social History   Socioeconomic History  . Marital status: Single    Spouse name: Not on file  . Number of children: Not on file  . Years of education: Not on file  . Highest education level: Not on file  Occupational History  . Not on file  Tobacco Use  . Smoking status: Never Smoker  . Smokeless tobacco: Never Used  Vaping Use  . Vaping Use: Never used  Substance and Sexual Activity  . Alcohol use: No  . Drug use: No  . Sexual activity: Never  Other Topics Concern  . Not on file  Social History Narrative  . Not on file   Social Determinants of Health   Financial Resource Strain: Not on file  Food Insecurity: Not on file  Transportation Needs: Not on file  Physical Activity: Not on file  Stress:  Not on file  Social Connections: Not on file  Intimate Partner Violence: Not on file    Review of Systems  Constitutional: Negative for fatigue.  Psychiatric/Behavioral: Positive for sleep disturbance.    There were no vitals filed for this visit.   Physical Exam Constitutional:      Appearance: She is obese.  HENT:     Mouth/Throat:     Mouth: Mucous membranes are moist.  Cardiovascular:     Rate and Rhythm: Normal rate and regular rhythm.     Heart sounds: No murmur heard. No friction rub.  Pulmonary:     Effort: No respiratory distress.     Breath sounds: No stridor. No wheezing  or rhonchi.  Neurological:     Mental Status: She is alert.     Assessment:  Moderate probability of obstructive sleep apnea  Evaluation and treatment will be difficult as he may not be able to have patient cooperation with study  Pathophysiology of sleep disordered breathing reviewed with patient's mother Treatment options for sleep apnea reviewed with patient's mother  Plan/Recommendations: We will not proceed with evaluation at the present time  Focus on dietary changes for weight loss  Sleeping with the head of the bed elevated may help however patient does sleep on her stomach,  may look into having the whole bed at an incline  Call with any significant concerns     Virl Diamond MD [facility] 02/04/2021, 9:12 AM  CC: Verlon Au, MD

## 2021-05-22 ENCOUNTER — Ambulatory Visit: Payer: Medicaid Other | Admitting: Nurse Practitioner

## 2021-06-24 ENCOUNTER — Ambulatory Visit: Payer: Medicaid Other | Admitting: Nurse Practitioner

## 2021-12-20 ENCOUNTER — Emergency Department (HOSPITAL_COMMUNITY)
Admission: EM | Admit: 2021-12-20 | Discharge: 2021-12-20 | Disposition: A | Discharge status: Home / Self Care | Payer: Medicaid Other | Attending: Emergency Medicine | Admitting: Emergency Medicine

## 2021-12-20 ENCOUNTER — Encounter (HOSPITAL_COMMUNITY): Payer: Self-pay | Admitting: Emergency Medicine

## 2021-12-20 ENCOUNTER — Other Ambulatory Visit: Payer: Self-pay

## 2021-12-20 DIAGNOSIS — U071 COVID-19: Secondary | ICD-10-CM | POA: Diagnosis not present

## 2021-12-20 DIAGNOSIS — R059 Cough, unspecified: Secondary | ICD-10-CM | POA: Diagnosis present

## 2021-12-20 LAB — RESP PANEL BY RT-PCR (FLU A&B, COVID) ARPGX2
Influenza A by PCR: NEGATIVE
Influenza B by PCR: NEGATIVE
SARS Coronavirus 2 by RT PCR: POSITIVE — AB

## 2021-12-20 NOTE — ED Provider Notes (Signed)
[facility]-EMERGENCY DEPT Provider Note   CSN: [id number] Arrival date & time: 12/20/21  1141     History No chief complaint on file.   Tracey Graham is a 35 y.o. female has medical history significant for cerebral palsy, hydrocephalus with a ventriculoperitoneal shunt, as well as history of seizures who is currently taking phenobarbital prophylactically presents with 1 day of cough and sneezing.  According to mother patient has not had any fever, chills, abdominal pain.  Patient is acting herself, is in good spirits.  Patient denies any pain at this time.  Father with positive COVID test, and patient with positive home COVID test this morning.   HPI     Past Medical History:  Diagnosis Date   DVT (deep venous thrombosis) (HCC)    Hydrocephalus (HCC)    Seizures (HCC)    VP (ventriculoperitoneal) shunt status     Patient Active Problem List   Diagnosis Date Noted   RLQ abdominal pain 06/19/2020   Hypoxia    Sinus tachycardia 11/12/2019   Multiple gastric ulcers 11/12/2019   AMS (altered mental status)    Abdominal pain    Nuchal rigidity    Aseptic meningitis    Constipation    Deep vein thrombosis (DVT) of femoral vein of left lower extremity (HCC)    Infection of VP (ventriculoperitoneal) shunt (HCC) 10/13/2019   Hydrocephalus (HCC)    VP (ventriculoperitoneal) shunt status    Encephalopathy    Dysphagia 12/13/2013   Legally blind 03/28/2013   History of hydrocephalus 03/28/2013    Past Surgical History:  Procedure Laterality Date   CARDIAC SURGERY     ESOPHAGOGASTRODUODENOSCOPY (EGD) WITH PROPOFOL N/A 10/25/2019   Procedure: ESOPHAGOGASTRODUODENOSCOPY (EGD) WITH PROPOFOL;  Surgeon: Lemar Lofty., MD;  Location: [facility] ENDOSCOPY;  Service: Gastroenterology;  Laterality: N/A;   VENTRICULOPERITONEAL SHUNT       OB History   No obstetric history on file.     Family History  Problem Relation Age of Onset   Hypertension Mother     Hypertension Father    Diabetes Father     Social History   Tobacco Use   Smoking status: Never   Smokeless tobacco: Never  Vaping Use   Vaping Use: Never used  Substance Use Topics   Alcohol use: No   Drug use: No    Home Medications Prior to Admission medications   Medication Sig Start Date End Date Taking? Authorizing Provider  acetaminophen (TYLENOL) 160 MG/5ML liquid Take 500 mg by mouth every 4 (four) hours as needed for fever.    [provider]  PHENobarbital (LUMINAL) 97.2 MG tablet Take 97.2 mg by mouth at bedtime.     [provider]  polyethylene glycol (MIRALAX) packet Take 17 g by mouth daily. Patient taking differently: Take 17 g by mouth daily as needed for mild constipation or moderate constipation. 01/06/13   Schinlever, Santina Evans, PA-C    Allergies    Penicillins  Review of Systems   Review of Systems  HENT:  Positive for sneezing.   Respiratory:  Positive for cough.   All other systems reviewed and are negative.  Physical Exam Updated Vital Signs BP 128/88 (BP Location: Left Arm)    Pulse 85    Temp 98.5 F (36.9 C) (Oral)    Resp 18    SpO2 98%   Physical Exam Vitals and nursing note reviewed.  Constitutional:      General: She is not in acute distress.  Appearance: Normal appearance. She is not ill-appearing.     Comments: Patient with clinical appearance of cerebral palsy, who is wheelchair-bound, but in pleasant spirits, no acute distress  HENT:     Head: Normocephalic and atraumatic.  Eyes:     General:        Right eye: No discharge.        Left eye: No discharge.  Cardiovascular:     Rate and Rhythm: Normal rate and regular rhythm.  Pulmonary:     Effort: Pulmonary effort is normal. No respiratory distress.     Comments: Normal excursion bilaterally, no accessory breath sounds noted, no wheezing, rhonchi, rales Musculoskeletal:        General: No deformity.     Cervical back: Neck supple. No rigidity.   Lymphadenopathy:     Cervical: No cervical adenopathy.  Skin:    General: Skin is warm and dry.  Neurological:     Mental Status: She is alert and oriented to person, place, and time.  Psychiatric:        Mood and Affect: Mood normal.        Behavior: Behavior normal.    ED Results / Procedures / Treatments   Labs (all labs ordered are listed, but only abnormal results are displayed) Labs Reviewed  RESP PANEL BY RT-PCR (FLU A&B, COVID) [id number]    EKG None  Radiology No results found.  Procedures Procedures   Medications Ordered in ED Medications - No data to display  ED Course  I have reviewed the triage vital signs and the nursing notes.  Pertinent labs & imaging results that were available during my care of the patient were reviewed by me and considered in my medical decision making (see chart for details).    MDM Rules/Calculators/A&P                          Overall well-appearing patient with clinical appearance, medical history significant for seizure disorder, cerebral palsy.  Patient with appearance of mild upper respiratory infection.  Patient has normal-appearing oropharynx, no swollen tonsils, no evidence of peritonsillar abscess, no cervical adenopathy.  Patient's airway is patent, she has clear breath sounds bilaterally.  She is afebrile, stable vital signs.  Although she is potentially at high risk factor for a complicated disease course secondary to her cerebral palsy as she is currently taking phenobarbital for seizure prevention I would not recommend that she take Paxlovid as Paxlovid is contraindicated with concurrent use of phenobarbital and I believe that it is safer for her to continue her seizure prophylaxis than begin an antiviral therapy.  As patient has stable vital signs, no accessory breath sounds today we will perform COVID, flu swab, and discussed with mother that I do not recommend any additional work-up.  Discussed return precautions including  worsening chest pain, shortness of breath, intractable nausea, vomiting.  Patient discharged in stable condition. Final Clinical Impression(s) / ED Diagnoses Final diagnoses:  COVID-19    Rx / DC Orders ED Discharge Orders     None        [place] 12/20/21 1251    Lorre Nick, MD 12/21/21 [phone]

## 2021-12-20 NOTE — ED Triage Notes (Signed)
BIB mother, reports pt was exposed to Covid and tested positive on rapid this morning. Mother reports pt is in good spirits, has been coughing and sneezing.. Denies fevers.

## 2021-12-20 NOTE — Discharge Instructions (Signed)
As we discussed you are presumably having a COVID infection as you had a positive rapid test at home.  We will repeat a more sensitive test today, you can check your results on your patient portal in 1 to 2 hours.  Current CDC guidelines if you are positive for COVID include strict quarantine for 5 days after date of diagnosis, with an additional 5 days of strict masking thereafter. ° °As we discussed I do not recommend the antiviral treatment for either of you.  For Ms. Brittini there is interaction between the antiviral medication and her phenobarbital, and we would prefer that she continue her seizure prophylaxis then take the antiviral treatment.  Based on your symptoms I do not believe that you meet the severity criteria, or other risk factors to have a complicated course of COVID.  As we discussed I recommend that you use supportive care including decongestants, cold and flu medication over-the-counter.  You can use ibuprofen or Tylenol for body aches. ° °I recommend that you drink plenty fluids, and rest.  If you begin experiencing worsening shortness of breath, chest pain, intractable nausea or vomiting please return for further evaluation. °

## 2022-04-27 ENCOUNTER — Ambulatory Visit (INDEPENDENT_AMBULATORY_CARE_PROVIDER_SITE_OTHER): Payer: Medicaid Other | Admitting: Gastroenterology

## 2022-04-27 ENCOUNTER — Encounter: Payer: Self-pay | Admitting: Gastroenterology

## 2022-04-27 ENCOUNTER — Other Ambulatory Visit (INDEPENDENT_AMBULATORY_CARE_PROVIDER_SITE_OTHER): Payer: Medicaid Other

## 2022-04-27 DIAGNOSIS — K5900 Constipation, unspecified: Secondary | ICD-10-CM | POA: Diagnosis not present

## 2022-04-27 LAB — COMPREHENSIVE METABOLIC PANEL
ALT: 12 U/L (ref 0–35)
AST: 13 U/L (ref 0–37)
Albumin: 4.3 g/dL (ref 3.5–5.2)
Alkaline Phosphatase: 119 U/L — ABNORMAL HIGH (ref 39–117)
BUN: 6 mg/dL (ref 6–23)
CO2: 27 mEq/L (ref 19–32)
Calcium: 8.9 mg/dL (ref 8.4–10.5)
Chloride: 102 mEq/L (ref 96–112)
Creatinine, Ser: 0.5 mg/dL (ref 0.40–1.20)
GFR: 120.96 mL/min (ref >60.00)
Glucose, Bld: 83 mg/dL (ref 70–99)
Potassium: 4.4 mEq/L (ref 3.5–5.1)
Sodium: 137 mEq/L (ref 135–145)
Total Bilirubin: 0.3 mg/dL (ref 0.2–1.2)
Total Protein: 7.6 g/dL (ref 6.0–8.3)

## 2022-04-27 LAB — CBC WITH DIFFERENTIAL/PLATELET
Basophils Absolute: 0 10*3/uL (ref 0.0–0.1)
Basophils Relative: 0.8 % (ref 0.0–3.0)
Eosinophils Absolute: 0 10*3/uL (ref 0.0–0.7)
Eosinophils Relative: 0.9 % (ref 0.0–5.0)
HCT: 43.5 % (ref 36.0–46.0)
Hemoglobin: 15.2 g/dL — ABNORMAL HIGH (ref 12.0–15.0)
Lymphocytes Relative: 37 % (ref 12.0–46.0)
Lymphs Abs: 1.5 10*3/uL (ref 0.7–4.0)
MCHC: 34.9 g/dL (ref 30.0–36.0)
MCV: 83.6 fl (ref 78.0–100.0)
Monocytes Absolute: 0.3 10*3/uL (ref 0.1–1.0)
Monocytes Relative: 7.7 % (ref 3.0–12.0)
Neutro Abs: 2.2 10*3/uL (ref 1.4–7.7)
Neutrophils Relative %: 53.6 % (ref 43.0–77.0)
Platelets: 198 10*3/uL (ref 150.0–400.0)
RBC: 5.2 Mil/uL — ABNORMAL HIGH (ref 3.87–5.11)
RDW: 14.3 % (ref 11.5–15.5)
WBC: 4.2 10*3/uL (ref 4.0–10.5)

## 2022-04-27 LAB — TSH: TSH: 1.04 u[IU]/mL (ref 0.35–5.50)

## 2022-04-27 MED ORDER — LINACLOTIDE 145 MCG PO CAPS: 145.0000 ug | ORAL_CAPSULE | Freq: Every day | ORAL | 3 refills | Status: DC

## 2022-04-27 NOTE — Progress Notes (Signed)
? ?Referring Provider: Bartholome Bill, MD ?Primary Care Physician:  Bartholome Bill, MD ? ? ?Reason for Consultation:  Mother's concerns ? ? ?IMPRESSION:  ?Altered bowel habits ?Chronic constipation ?Poor eating ?LA Class A reflux esophagitis on EGD 2020 ?History of gastric ulcers on EGD 2020 likely due to ibuprofen ?   - H pylori stool antigen negative ? ?We agreed to focus on improvement in constipation. Will pursue additional evaluation of symptoms do not improve with resolution of constipation.  ? ?PLAN: ?- CMP, CBC, TSH ?- Start Linzess 145 mcg daily ?- Discussed repeating EGD if symptoms persist despite improvement of constipation ?- CT abd/pelvis with contrast if not improving on Linzess ?- Mom will follow-up by MyChart in 2 weeks ? ? ? ?HPI: Tracey Graham is a 36 y.o. female  The history is obtained through her mother, who accompanies her to this appointment She has cerebral palsy, seizure disorder, s/p VP shunt for hydrocephalus 2014, and a history of DVT previously requiring anticoagulation. She is mostly nonverbal and legally blind. Last seizure was more than 12 years ago. She is on phenobarbital. She is wheelchair-bound. ? ?She has a history of constipation.  Mom feels like she is forcing her to eat. Frequency moaning which she interprets as lack of appetite and intolerance to eating food.  She has ongoing constipation. Will have a BM weekly. Miralax seems to worsen her symptoms. She will use [place] to soften the stools. No blood or mucous in the stool. She feels better after a bowel movement and will eat a little more the days she has a bowel movement. No blood or mucous in the stool.  ? ?No recent NSAID use.  ? ?EGD 10/25/19 showed LA Grade C reflux esophagitis, 4 cratered gastric ulcers the largest measuring 77mm, gastritis, a gastric pedunculated polyp ? ?H pylori stool antigen negative ? ?UGI series 03/17/20: no large gastric ulcer identified ? ?CT with contrast 02/26/20: no acute  findings ? ?Last CT 06/26/20 showed no acute findings ? ? ?Past Medical History:  ?Diagnosis Date  ? DVT (deep venous thrombosis) ([place])   ? Hydrocephalus ([place])   ? Seizures ([place])   ? VP (ventriculoperitoneal) shunt status   ? ? ?Past Surgical History:  ?Procedure Laterality Date  ? CARDIAC SURGERY    ? ESOPHAGOGASTRODUODENOSCOPY (EGD) WITH PROPOFOL N/A 10/25/2019  ? Procedure: ESOPHAGOGASTRODUODENOSCOPY (EGD) WITH PROPOFOL;  Surgeon: Rush Landmark Telford Nab., MD;  Location: [place];  Service: Gastroenterology;  Laterality: N/A;  ? VENTRICULOPERITONEAL SHUNT    ? ? ?Prior to Admission medications   ?Medication Sig Start Date End Date Taking? Authorizing Provider  ?acetaminophen (TYLENOL) 160 MG/5ML liquid Take 500 mg by mouth every 4 (four) hours as needed for fever.   Yes [provider]  ?PHENobarbital (LUMINAL) 97.2 MG tablet Take 97.2 mg by mouth at bedtime.    Yes [provider]  ?polyethylene glycol (MIRALAX) packet Take 17 g by mouth daily. ?Patient taking differently: Take 17 g by mouth daily as needed for mild constipation or moderate constipation. 01/06/13  Yes Schinlever, Barnetta Chapel, PA-C  ? ? ?Current Outpatient Medications  ?Medication Sig Dispense Refill  ? acetaminophen (TYLENOL) 160 MG/5ML liquid Take 500 mg by mouth every 4 (four) hours as needed for fever.    ? linaclotide (LINZESS) 145 MCG CAPS capsule Take 1 capsule (145 mcg total) by mouth daily before breakfast. 30 capsule 3  ? PHENobarbital (LUMINAL) 97.2 MG tablet Take 97.2 mg by mouth at bedtime.     ?  polyethylene glycol (MIRALAX) packet Take 17 g by mouth daily. (Patient taking differently: Take 17 g by mouth daily as needed for mild constipation or moderate constipation.) 5 each 0  ? ?No current facility-administered medications for this visit.  ? ? ?Allergies as of 04/27/2022 - Review Complete 04/27/2022  ?Allergen Reaction Noted  ? Penicillins Swelling 01/05/2013  ? ? ?Family History  ?Problem Relation Age of Onset  ?  Hypertension Mother   ? Hypertension Father   ? Diabetes Father   ? ? ? ? ?Physical Exam: ?General:   Alert,  well-nourished, pleasant, NAD, smiling in her wheelchair ?Head:  Normocephalic and atraumatic. ?Eyes:  Sclera clear, no icterus.   Conjunctiva pink. ?Ears:  Normal auditory acuity. ?Nose:  No deformity, discharge,  or lesions. ?Mouth:  No deformity or lesions.   ?Neck:  Supple; no masses or thyromegaly. ?Lungs:  Clear throughout to auscultation.   No wheezes. ?Heart:  Regular rate and rhythm; no murmurs. ?Abdomen:  Soft, nontender, nondistended, normal bowel sounds, no rebound or guarding. No hepatosplenomegaly.   ?Rectal:  Deferred  ?Msk:  Symmetrical. No boney deformities ?LAD: No inguinal or umbilical LAD ?Extremities:  No clubbing or edema. ?Neurologic:  Alert  ?Skin:  Intact without significant lesions or rashes. ?Psych:  Alert and cooperative. Normal mood and affect. ? ? ?Echo Propp L. Tarri Glenn, MD, MPH ?04/27/2022, 9:49 PM ? ? ? ?  ?

## 2022-04-27 NOTE — Patient Instructions (Addendum)
It was my pleasure to provide care to you today. Based on our discussion, I am providing you with my recommendations below: ? ?RECOMMENDATION(S):  ? ?LABS:  ? ?Please proceed to the basement level for lab work before leaving today. Press "B" on the elevator. The lab is located at the first door on the left as you exit the elevator. ? ?PRESCRIPTION MEDICATION(S):  ? ?We have sent the following medication(s) to your pharmacy: ? ?Linzess 145 mcg daily before breakfast. ? ?NOTE: If your medication(s) requires a PRIOR AUTHORIZATION, we will receive notification from your pharmacy. Once received, the process to submit for approval may take up to 7-10 business days. You will be contacted about any denials we have received from your insurance company as well as alternatives recommended by your provider. ? ? ?FOLLOW UP: ? ?Follow up with me via mychart in 1 week with an update.  ? ?BMI: ? ? ?If you are age 35 or younger, your body mass index should be between 19-25. Your There is no height or weight on file to calculate BMI. If this is out of the aformentioned range listed, please consider follow up with your Primary Care Provider.  ? ?MY CHART: ? ?The [place] [facility] providers would like to encourage you to use [facility] to communicate with providers for non-urgent requests or questions.  Due to long hold times on the telephone, sending your provider a message by [facility] may be a faster and more efficient way to get a response.  Please allow 48 business hours for a response.  Please remember that this is for non-urgent requests.  ? ?Thank you for trusting me with your gastrointestinal care!   ? ?Tressia Danas, MD, MPH ? ?

## 2022-09-12 ENCOUNTER — Other Ambulatory Visit: Payer: Self-pay | Admitting: Gastroenterology

## 2023-06-15 ENCOUNTER — Other Ambulatory Visit: Payer: Self-pay | Admitting: Gastroenterology

## 2023-06-27 ENCOUNTER — Other Ambulatory Visit: Payer: Self-pay | Admitting: Gastroenterology

## 2023-06-29 ENCOUNTER — Telehealth: Payer: Self-pay | Admitting: Gastroenterology

## 2023-06-29 MED ORDER — LINACLOTIDE 145 MCG PO CAPS: 145.0000 ug | ORAL_CAPSULE | Freq: Every day | ORAL | 5 refills | Status: DC

## 2023-06-29 NOTE — Telephone Encounter (Signed)
Patient's mom informed patient is due for yearly follow up. Scheduled follow up. Refills sent to pharmacy.

## 2023-06-29 NOTE — Telephone Encounter (Signed)
Inbound call from patient's mother requesting a refill for Linzess. Also requesting a call back if medication can be refilled. Please advise, thank you.

## 2023-09-14 ENCOUNTER — Ambulatory Visit: Payer: Medicaid Other | Admitting: Physician Assistant

## 2024-05-18 ENCOUNTER — Emergency Department (HOSPITAL_COMMUNITY)

## 2024-05-18 ENCOUNTER — Other Ambulatory Visit: Payer: Self-pay

## 2024-05-18 ENCOUNTER — Emergency Department (HOSPITAL_COMMUNITY)
Admission: EM | Admit: 2024-05-18 | Discharge: 2024-05-18 | Disposition: A | Discharge status: Home / Self Care | Attending: Emergency Medicine | Admitting: Emergency Medicine

## 2024-05-18 ENCOUNTER — Encounter (HOSPITAL_COMMUNITY): Payer: Self-pay

## 2024-05-18 DIAGNOSIS — R109 Unspecified abdominal pain: Secondary | ICD-10-CM | POA: Insufficient documentation

## 2024-05-18 DIAGNOSIS — M51362 Other intervertebral disc degeneration, lumbar region with discogenic back pain and lower extremity pain: Secondary | ICD-10-CM | POA: Diagnosis not present

## 2024-05-18 DIAGNOSIS — R52 Pain, unspecified: Secondary | ICD-10-CM | POA: Diagnosis present

## 2024-05-18 LAB — URINALYSIS, ROUTINE W REFLEX MICROSCOPIC
Bilirubin Urine: NEGATIVE
Glucose, UA: NEGATIVE mg/dL
Hgb urine dipstick: NEGATIVE
Ketones, ur: 20 mg/dL — AB
Leukocytes,Ua: NEGATIVE
Nitrite: NEGATIVE
Protein, ur: NEGATIVE mg/dL
Specific Gravity, Urine: 1.021 (ref 1.005–1.030)
pH: 6 (ref 5.0–8.0)

## 2024-05-18 MED ORDER — CELECOXIB 200 MG PO CAPS: 200.0000 mg | ORAL_CAPSULE | Freq: Two times a day (BID) | ORAL | 0 refills | Status: AC

## 2024-05-18 MED ORDER — PREDNISONE 20 MG PO TABS
60.0000 mg | ORAL_TABLET | Freq: Once | ORAL | Status: AC
  Administered 2024-05-18: 60 mg via ORAL
  Filled 2024-05-18: qty 3

## 2024-05-18 MED ORDER — SODIUM CHLORIDE 0.9 % IV BOLUS: 1000.0000 mL | Freq: Once | INTRAVENOUS | Status: DC

## 2024-05-18 MED ORDER — PREDNISONE 50 MG PO TABS: 50.0000 mg | ORAL_TABLET | Freq: Every day | ORAL | 0 refills | Status: DC

## 2024-05-18 MED ORDER — CELECOXIB 200 MG PO CAPS
200.0000 mg | ORAL_CAPSULE | Freq: Once | ORAL | Status: AC
  Administered 2024-05-18: 200 mg via ORAL
  Filled 2024-05-18: qty 1

## 2024-05-18 NOTE — ED Notes (Signed)
 Provider attempted line, unsuccessful, alerting another provider to try

## 2024-05-18 NOTE — ED Notes (Signed)
 Tracey Graham in room attempting US  IV

## 2024-05-18 NOTE — ED Notes (Signed)
 Let provider and pt mom know that unable to get access still

## 2024-05-18 NOTE — ED Triage Notes (Signed)
 Patients mom said patient has been moaning for the last couple of nights. She said when she lifts her legs up she moans. Was tested for blood clots and it was negative. History of ulcers in her stomach and mom said she acted this way last time.

## 2024-05-18 NOTE — ED Provider Notes (Signed)
**Tracey [name]**  Tracey Tracey Graham   CSN: [id number] Arrival date & time: 05/18/24  0830     History  Chief Complaint  Patient presents with   Pain    Tracey Tracey Graham is a 38 y.o. female.  Pt is a 38 yo female with pmhx significant for cerebral palsy with spastic quadriplegia and confined to wheelchair, constipation, seizures, s/p VP shunt, and hx stomach ulcers.  Pt's mom said pt has been moaning at night and seems to have pain in her stomach.  She notices it the most when she lifts up her legs to change her diaper.  Pt has not had fever.  She did have a bm 2 days ago.  She has been eating/drinking her normal.  She did have a nl US  to eval for DVT on 5/16.  Mom is concerned she's developed ulcers again.    Her last EGD:  10/ 29/ 2020 Yong Henle, MD  Impression: - No gross lesions in esophagus proximally. LA Grade C esophagitis distally. - Non- bleeding gastric ulcers with a clean ulcer base ( Forrest Class III) . Erythematous mucosa in the gastric body and antrum. One gastric polyp - likely fundic gland. - No gross lesions in the duodenal bulb, in the first portion of the duodenum and in the second portion of the duodenum. - No biopsies obtained due to patient still being on active anticoagulation. Recommendation: - The patient will be observed post- procedure, until all discharge criteria are met. - Return patient to hospital ward for ongoing care. - Patient has a contact number available for emergencies. The signs and symptoms of potential delayed complications were discussed with the patient. Return to normal activities tomorrow. Written discharge instructions were provided to the patient. - Advance diet as tolerated. - IV PPI x 48 more hours and then transition to PO PPI BID 40 mg. - Observe patient' s clinical course. - Send H. pylori serology and if positive should treat with quadruple therapy. - Patient should undergo repeat EGD in 3-  months to ensure healing of gastric ulcers and esophagitis. - Minimize NSAIDs as able. - The findings and recommendations were discussed with the patient' s family.  It does not look as she had a repeat EGD.  She is not currently on PPIs.       Home Medications Prior to Admission medications   Medication Sig Start Date End Date Taking? Authorizing Provider  celecoxib (CELEBREX) 200 MG capsule Take 1 capsule (200 mg total) by mouth 2 (two) times daily. 05/18/24  Yes Sofia Jaquith, MD  predniSONE (DELTASONE) 50 MG tablet Take 1 tablet (50 mg total) by mouth daily with breakfast. 05/18/24  Yes Sueellen Emery, MD  acetaminophen  (TYLENOL ) 160 MG/5ML liquid Take 500 mg by mouth every 4 (four) hours as needed for fever.    [provider]  linaclotide  (LINZESS ) 145 MCG CAPS capsule Take 1 capsule (145 mcg total) by mouth daily before breakfast. 06/29/23   Kenney Peacemaker, MD  PHENobarbital  (LUMINAL) 97.2 MG tablet Take 97.2 mg by mouth at bedtime.     [provider]  polyethylene glycol (MIRALAX ) packet Take 17 g by mouth daily. Patient taking differently: Take 17 g by mouth daily as needed for mild constipation or moderate constipation. 01/06/13   Schinlever, Bynum Cassis, PA-C      Allergies    Penicillins    Review of Systems   Review of Systems  Gastrointestinal:  Positive for abdominal pain.  All  other systems reviewed and are negative.   Physical Exam Updated Vital Signs BP (!) 152/107   Pulse 90   Temp 99 F (37.2 C) (Axillary)   Resp 16   Ht 5\' 4"  (1.626 m)   Wt 70.3 kg   SpO2 93%   BMI 26.61 kg/m  Physical Exam Vitals and nursing Tracey Graham reviewed.  Constitutional:      Appearance: Normal appearance. She is obese.  HENT:     Head: Normocephalic and atraumatic.     Right Ear: External ear normal.     Left Ear: External ear normal.     Nose: Nose normal.     Mouth/Throat:     Mouth: Mucous membranes are moist.     Pharynx: Oropharynx is clear.  Eyes:      Extraocular Movements: Extraocular movements intact.     Conjunctiva/sclera: Conjunctivae normal.     Pupils: Pupils are equal, round, and reactive to light.  Cardiovascular:     Rate and Rhythm: Normal rate and regular rhythm.     Pulses: Normal pulses.     Heart sounds: Normal heart sounds.  Pulmonary:     Effort: Pulmonary effort is normal.     Breath sounds: Normal breath sounds.  Abdominal:     General: Abdomen is flat. Bowel sounds are normal.     Palpations: Abdomen is soft.  Musculoskeletal:     Cervical back: Normal range of motion and neck supple.     Comments: contractures  Skin:    General: Skin is warm.     Capillary Refill: Capillary refill takes less than 2 seconds.  Neurological:     Mental Status: She is alert. Mental status is at baseline.  Psychiatric:        Mood and Affect: Mood normal.     ED Results / Procedures / Treatments   Labs (all labs ordered are listed, but only abnormal results are displayed) Labs Reviewed  URINALYSIS, ROUTINE W REFLEX MICROSCOPIC - Abnormal; Notable for the following components:      Result Value   Ketones, ur 20 (*)    All other components within normal limits  CBC WITH DIFFERENTIAL/PLATELET  PHENOBARBITAL  LEVEL  COMPREHENSIVE METABOLIC PANEL WITH GFR  LIPASE, BLOOD  HCG, SERUM, QUALITATIVE  CBC WITH DIFFERENTIAL/PLATELET    EKG None  Radiology CT ABDOMEN PELVIS WO CONTRAST Result Date: 05/18/2024 CLINICAL DATA:  Abdominal pain, acute, nonlocalized. EXAM: CT ABDOMEN AND PELVIS WITHOUT CONTRAST TECHNIQUE: Multidetector CT imaging of the abdomen and pelvis was performed following the standard protocol without IV contrast. RADIATION DOSE REDUCTION: This exam was performed according to the departmental dose-optimization program which includes automated exposure control, adjustment of the mA and/or kV according to patient size and/or use of iterative reconstruction technique. COMPARISON:  CT scan abdomen and pelvis from  06/25/2020. FINDINGS: Lower chest: The lung bases are clear. No pleural effusion. The heart is normal in size. No pericardial effusion. Hepatobiliary: The liver is normal in size. Non-cirrhotic configuration. No suspicious mass. No intrahepatic or extrahepatic bile duct dilation. No calcified gallstones. Normal gallbladder wall thickness. No pericholecystic inflammatory changes. Pancreas: Unremarkable. No pancreatic ductal dilatation or surrounding inflammatory changes. Spleen: Within normal limits. No focal lesion. Adrenals/Urinary Tract: Adrenal glands are unremarkable. No suspicious renal mass. No hydronephrosis. No renal or ureteric calculi. Urinary bladder is under distended, precluding optimal assessment. However, no large mass or stones identified. No perivesical fat stranding. Stomach/Bowel: No disproportionate dilation of the small or large bowel loops. No evidence  of abnormal bowel wall thickening or inflammatory changes. The appendix is unremarkable. There is small-to-moderate stool burden. Vascular/Lymphatic: There is small amount of ascites in the pelvis, likely related to ventriculoperitoneal shunt. No pneumoperitoneum. No abdominal or pelvic lymphadenopathy, by size criteria. No aneurysmal dilation of the major abdominal arteries. Reproductive: The uterus is unremarkable. No large adnexal mass. Other: Presumed ventriculoperitoneal shunt tubings noted which traverse across the left paramedian upper anterior abdominal wall and enters the peritoneal cavity through the left mid abdomen with the catheter looping in the right lower abdomen. The soft tissues and abdominal wall are otherwise unremarkable. Musculoskeletal: No suspicious osseous lesions. There are mild multilevel degenerative changes in the visualized spine. IMPRESSION: 1. No acute inflammatory process identified within the abdomen or pelvis. 2. Multiple other nonacute observations, as described above. Electronically Signed   By: Beula Brunswick  M.D.   On: 05/18/2024 14:13   DG Chest Portable 1 View Result Date: 05/18/2024 CLINICAL DATA:  Pain. EXAM: PORTABLE CHEST 1 VIEW COMPARISON:  02/23/2024 FINDINGS: Low lung volumes. Cardiopericardial silhouette is at upper limits of normal for size. The lungs are clear without focal pneumonia, edema, pneumothorax or pleural effusion. VP shunt catheter overlies the left chest. Telemetry leads overlie the chest. IMPRESSION: Low volume film without acute cardiopulmonary findings. Electronically Signed   By: Donnal Fusi M.D.   On: 05/18/2024 11:28    Procedures Procedures    Medications Ordered in ED Medications  sodium chloride  0.9 % bolus 1,000 mL (1,000 mLs Intravenous Not Given 05/18/24 1305)  predniSONE (DELTASONE) tablet 60 mg (has no administration in time range)  celecoxib (CELEBREX) capsule 200 mg (has no administration in time range)    ED Course/ Medical Decision Making/ A&P                                 Medical Decision Making Amount and/or Complexity of Data Reviewed Labs: ordered. Radiology: ordered.   This patient presents to the ED for concern of pain, this involves an extensive number of treatment options, and is a complaint that carries with it a high risk of complications and morbidity.  The differential diagnosis includes infection, electrolyte abn, constipation, ulcers, surgical abdomen   Co morbidities that complicate the patient evaluation  spastic quadriplegia and confined to wheelchair, constipation, seizures, s/p VP shunt, and hx stomach ulcers   Additional history obtained:  Additional history obtained from epic chart review External records from outside source obtained and reviewed including mom   Lab Tests:  I Ordered, and personally interpreted labs.  The pertinent results include:  ua + ketones   Imaging Studies ordered:  I ordered imaging studies including ct abd/pelvis  I independently visualized and interpreted imaging which showed  No  acute inflammatory process identified within the abdomen or  pelvis.  2. Multiple other nonacute observations, as described above.   I agree with the radiologist interpretation   Medicines ordered and prescription drug management:   I have reviewed the patients home medicines and have made adjustments as needed   Test Considered:  ct   Problem List / ED Course:  Pain:  we were unable to get any blood work. As she is afebrile and vitals are normal, I don't think we have to keep trying to stick her.   CT neg for anything acute, however, she does have ddd in the lumbar region.  This could be the cause of her pain as  mom notices it more when she moves her legs.   Reevaluation:  After the interventions noted above, I reevaluated the patient and found that they have :improved   Social Determinants of Health:  Lives at home   Dispostion:  After consideration of the diagnostic results and the patients response to treatment, I feel that the patent would benefit from discharge with outpatient f/u.          Final Clinical Impression(s) / ED Diagnoses Final diagnoses:  Degeneration of intervertebral disc of lumbar region with discogenic back pain and lower extremity pain    Rx / DC Orders ED Discharge Orders          Ordered    predniSONE (DELTASONE) 50 MG tablet  Daily with breakfast        05/18/24 1422    celecoxib (CELEBREX) 200 MG capsule  2 times daily        05/18/24 1422              Pama Roskos, MD 05/18/24 1424

## 2024-05-18 NOTE — ED Notes (Signed)
 Mom declined last set vs

## 2024-05-18 NOTE — ED Notes (Signed)
 Via mom pt no chance of pregnancy

## 2024-05-18 NOTE — ED Notes (Signed)
 Notified provider we are waiting for IV team to obtain line

## 2024-05-22 ENCOUNTER — Telehealth: Payer: Self-pay | Admitting: Gastroenterology

## 2024-05-22 NOTE — Telephone Encounter (Signed)
 Called and spoke with patient's mother. Patient's mother stated that patient was having abdominal pain and she took her to the ER. Pain was coming from a pinched nerve. CT was overall reassuring. Patient's mother denied that patient had any upper GI symptoms, including epigastric pain, nausea, or vomiting. Patient's mother stated that patient no longer needs appt. GI appt cancelled.

## 2024-05-22 NOTE — Telephone Encounter (Signed)
 Inbound call from patient mother requesting call back to discuss upcoming apt on 6/19.   States patient had ED visit on 5/23 and they ran some test.   Please review and advise.   Thank you

## 2024-06-14 ENCOUNTER — Ambulatory Visit: Admitting: Physician Assistant

## 2024-06-25 ENCOUNTER — Encounter: Payer: Self-pay | Admitting: Physician Assistant

## 2024-06-25 ENCOUNTER — Ambulatory Visit (INDEPENDENT_AMBULATORY_CARE_PROVIDER_SITE_OTHER): Admitting: Physician Assistant

## 2024-06-25 VITALS — BP 124/70 | HR 96 | Ht 64.0 in | Wt 160.0 lb

## 2024-06-25 DIAGNOSIS — R109 Unspecified abdominal pain: Secondary | ICD-10-CM

## 2024-06-25 DIAGNOSIS — K5909 Other constipation: Secondary | ICD-10-CM

## 2024-06-25 DIAGNOSIS — K2100 Gastro-esophageal reflux disease with esophagitis, without bleeding: Secondary | ICD-10-CM | POA: Diagnosis not present

## 2024-06-25 DIAGNOSIS — K259 Gastric ulcer, unspecified as acute or chronic, without hemorrhage or perforation: Secondary | ICD-10-CM

## 2024-06-25 DIAGNOSIS — G809 Cerebral palsy, unspecified: Secondary | ICD-10-CM

## 2024-06-25 DIAGNOSIS — R1031 Right lower quadrant pain: Secondary | ICD-10-CM

## 2024-06-25 DIAGNOSIS — K5904 Chronic idiopathic constipation: Secondary | ICD-10-CM

## 2024-06-25 DIAGNOSIS — R7989 Other specified abnormal findings of blood chemistry: Secondary | ICD-10-CM | POA: Diagnosis not present

## 2024-06-25 DIAGNOSIS — R1084 Generalized abdominal pain: Secondary | ICD-10-CM

## 2024-06-25 DIAGNOSIS — Z982 Presence of cerebrospinal fluid drainage device: Secondary | ICD-10-CM

## 2024-06-25 NOTE — Progress Notes (Signed)
 06/25/2024 Tracey Graham [id number] 1986/08/05  Referring provider: Jolee Madelin Patch, MD Primary GI doctor: Dr. San (Dr. Eda)  ASSESSMENT AND PLAN:  AB pain per mom, worse with lying down, moving her legs with chronic constipation 05/18/2024 CT abdominal pelvis without contrast for abdominal pain in the ER shows no acute inflammatory process, normal liver normal gallbladder normal pancreas normal spleen normal bowels, small to moderate stool burden, prednisone  was given with some help 04/2024 labs without anemia On Linzess  145 but states this is not helping, was on children's vitamin, not given last week - increase linzess  to 290 mcg, consider motegrity if this does not help or every other day senokot - can try salon pas patches, heating pad and tylenol  for possible MSK pain  History of class A reflux esophagitis and gastric ulcers likely secondary to ibuprofen  on EGD in 2020 02/2020 negative H. pylori stool She has beltching and not wanting to eat as much No NSAIDS, no ETOH, she did do 5 days of prednisone  She is not on medication for reflux Will add on allginate therapy for possible reflux If fixing the constipation does not help the discomfort, can consider EGD in the hospital and treatment with PPI  Elevated LFTs 2021 alk phos 129 ALT 48 AST 38 normal bilirubin 04/27/2022 alk phos 119, AST 13 ALT 12 total bilirubin 0.3 05/18/2024 CT and pelvis without contrast unremarkable liver and gallbladder -Continue to monitor LFTs - declines labs today, check GGT  Cerebral palsy with VP shunt for hydrocephalus 2014 and seizure disorder Mostly nonverbal and legally blind Patient is wheelchair-bound  Patient Care Team: Jolee Madelin Patch, MD as PCP - General (Family Medicine)  HISTORY OF PRESENT ILLNESS: 38 y.o. female with a past medical history listed below presents for evaluation of constipation and AB pain.   Patient last seen in the office 04/27/2022 by Dr.  Eda due to mother's concern of abdominal pain and constipation. Mother is here and provides history.   Discussed the use of AI scribe software for clinical note transcription with the patient, who gave verbal consent to proceed.  History of Present Illness   Tracey Graham is a 38 year old female with a history of ulcers who presents with abdominal pain and constipation. She is accompanied by her caregiver.  She experiences significant abdominal pain, described as severe and located in the stomach area, resembling a blockage. The pain is in the same area as her previous ulcers. She has difficulty passing stools, with very little bowel movement, and when she does, the stool is dark and sticky. The constipation has persisted for a couple of months.  She has been using Linzess  for constipation, but it is no longer effective. Previously, Linzess  worked well for her. She has also tried Senokot, which helped her move her bowels and reduced her abdominal pain temporarily. She is not currently on any medications for heartburn or reflux and has not been taking NSAIDs regularly. She was given prednisone  during a recent hospital visit, which seemed to reduce her pain, but she is not currently taking it.  Her caregiver reports increased belching and a reduced appetite, which is unusual for her. She has a history of difficulty chewing and swallowing, which sometimes leads to choking. No fever, chills, or shortness of breath. Her stools have not shown any blood, but they have been darker than usual.  In May 2025, she underwent a CT scan which did not reveal any acute issues with the liver or gallbladder,  and there was no evidence of anemia. She has a history of being legally blind, which contributes to her stress levels.        She  reports that she has never smoked. She has never used smokeless tobacco. She reports that she does not drink alcohol and does not use drugs.  RELEVANT GI HISTORY, IMAGING  AND LABS: Results   LABS H. pylori: Negative (2021) Alkaline phosphatase: Elevated (2021)  RADIOLOGY Abdominal CT: No acute process in liver or gallbladder (05/18/2024)     EGD 10/25/19 showed LA Grade C reflux esophagitis, 4 cratered gastric ulcers the largest measuring 10mm, gastritis, a gastric pedunculated polyp   H pylori stool antigen negative   UGI series 03/17/20: no large gastric ulcer identified   CT with contrast 02/26/20: no acute findings   CT 06/26/20 showed no acute findings   CBC    Component Value Date/Time   WBC 4.2 04/27/2022 1015   RBC 5.20 (H) 04/27/2022 1015   HGB 15.2 (H) 04/27/2022 1015   HCT 43.5 04/27/2022 1015   HCT 28.8 (L) 10/28/2019 1025   PLT 198.0 04/27/2022 1015   MCV 83.6 04/27/2022 1015   MCH 24.9 (L) 02/26/2020 1050   MCHC 34.9 04/27/2022 1015   RDW 14.3 04/27/2022 1015   LYMPHSABS 1.5 04/27/2022 1015   MONOABS 0.3 04/27/2022 1015   EOSABS 0.0 04/27/2022 1015   BASOSABS 0.0 04/27/2022 1015   No results for input(s): HGB in the last 8760 hours.  CMP     Component Value Date/Time   NA 137 04/27/2022 1015   K 4.4 04/27/2022 1015   CL 102 04/27/2022 1015   CO2 27 04/27/2022 1015   GLUCOSE 83 04/27/2022 1015   BUN 6 04/27/2022 1015   CREATININE 0.50 04/27/2022 1015   CALCIUM 8.9 04/27/2022 1015   PROT 7.6 04/27/2022 1015   ALBUMIN  4.3 04/27/2022 1015   AST 13 04/27/2022 1015   ALT 12 04/27/2022 1015   ALKPHOS 119 (H) 04/27/2022 1015   BILITOT 0.3 04/27/2022 1015   GFRNONAA >60 02/26/2020 1050   GFRAA >60 02/26/2020 1050      Latest Ref Rng & Units 04/27/2022   10:15 AM 06/19/2020   10:01 AM 02/26/2020   10:50 AM  Hepatic Function  Total Protein 6.0 - 8.3 g/dL 7.6  7.6  8.1   Albumin  3.5 - 5.2 g/dL 4.3  4.3  3.9   AST 0 - 37 U/L 13  13  38   ALT 0 - 35 U/L 12  16  48   Alk Phosphatase 39 - 117 U/L 119  131  129   Total Bilirubin 0.2 - 1.2 mg/dL 0.3  0.2  0.8       Current Medications:   Current Outpatient Medications  (Endocrine & Metabolic):    predniSONE  (DELTASONE ) 50 MG tablet, Take 1 tablet (50 mg total) by mouth daily with breakfast. (Patient not taking: Reported on 06/25/2024)    Current Outpatient Medications (Analgesics):    acetaminophen  (TYLENOL ) 160 MG/5ML liquid, Take 500 mg by mouth every 4 (four) hours as needed for fever.   celecoxib  (CELEBREX ) 200 MG capsule, Take 1 capsule (200 mg total) by mouth 2 (two) times daily. (Patient not taking: Reported on 06/25/2024)   Current Outpatient Medications (Other):    linaclotide  (LINZESS ) 145 MCG CAPS capsule, Take 1 capsule (145 mcg total) by mouth daily before breakfast.   PHENobarbital  (LUMINAL) 97.2 MG tablet, Take 97.2 mg by mouth at bedtime.  polyethylene glycol (MIRALAX ) packet, Take 17 g by mouth daily. (Patient not taking: Reported on 06/25/2024)  Medical History:  Past Medical History:  Diagnosis Date   DVT (deep venous thrombosis) (HCC)    Hydrocephalus (HCC)    Seizures (HCC)    VP (ventriculoperitoneal) shunt status    Allergies:  Allergies  Allergen Reactions   Penicillins Swelling    Did not have reaction to 5d of amoxicillin Has patient had a PCN reaction causing immediate rash, facial/tongue/throat swelling, SOB or lightheadedness with hypotension: Yes Has patient had a PCN reaction causing severe rash involving mucus membranes or skin necrosis: No Has patient had a PCN reaction that required hospitalization No Has patient had a PCN reaction occurring within the last 10 years: No If all of the above answers are NO, then may proceed with Cephalosporin use.      Surgical History:  She  has a past surgical history that includes Ventriculoperitoneal shunt; Cardiac surgery; and Esophagogastroduodenoscopy (egd) with propofol  (N/A, 10/25/2019). Family History:  Her family history includes Diabetes in her father; Hypertension in her father and mother.  REVIEW OF SYSTEMS  : All other systems reviewed and negative except  where noted in the History of Present Illness.  PHYSICAL EXAM: BP 124/70   Pulse 96   Ht 5' 4 (1.626 m)   Wt 160 lb (72.6 kg)   BMI 27.46 kg/m  Physical Exam   GENERAL APPEARANCE: Well nourished, in no apparent distress, in her wheel chair HEENT: No cervical lymphadenopathy, unremarkable thyroid, sclerae anicteric, conjunctiva pink. RESPIRATORY: Respiratory effort normal, breath sounds clear bilaterally without rales, rhonchi, or wheezing. CARDIO: Regular rate and rhythm with no murmurs, rubs, or gallops, peripheral pulses intact. ABDOMEN: Soft, non-distended, hypoactive bowel sounds in all four quadrants, no tenderness to palpation, no rebound, no mass appreciated. RECTAL: Declines. MUSCULOSKELETAL: upper spasticity, in wheelchair, non pitting edema SKIN: Dry, intact without rashes or lesions. No jaundice. NEURO: Alert, PSYCH: Cooperative, normal mood and affect. NECK: Supple.      Alan JONELLE Coombs, PA-C 2:33 PM

## 2024-06-25 NOTE — Patient Instructions (Addendum)
 We have given you samples of the following medication to take: take one [name] mcg capsule daily.   VISIT SUMMARY:  During your visit, we discussed your ongoing abdominal pain and constipation. We reviewed your history of ulcers and recent symptoms, including difficulty passing stools and dark, sticky stools. We also discussed your current medications and their effectiveness.  YOUR PLAN:  -CONSTIPATION: Constipation means having difficulty passing stools. We will increase your Linzess  dose to 290 mcg daily and consider using Senokot every other day if needed. If Linzess  is still not effective, we may try a different medication called Motegrity. We also discussed positions that may help with bowel movements and the potential impact of immobility on your pelvic floor.  -ABDOMINAL PAIN: Your abdominal pain may be related to constipation. Since your CT scan did not show any acute issues, we will focus on treating your constipation first and monitor for any changes in your pain.  -PEPTIC ULCERS: Peptic ulcers are sores that develop on the lining of your stomach. Although you have a history of ulcers, there are no active ulcers on your recent CT scan. We will monitor your symptoms and consider an 8-week ulcer treatment if managing your constipation does not relieve your pain.  -ELEVATED ALKALINE PHOSPHATASE: Elevated alkaline phosphatase is a condition where there is a higher than normal level of an enzyme in your blood. This has been mildly elevated since 2021, but your liver function tests are normal, and your CT scan did not show any issues with your liver or gallbladder. We will continue to monitor your levels and may recheck with a different lab if needed.  INSTRUCTIONS:  Please follow the new medication plan for your constipation and monitor your symptoms. If your abdominal pain changes or worsens, or if you have any new symptoms, please contact our office. We will recheck your alkaline  phosphatase levels in the future and consider further testing if necessary.

## 2024-06-27 ENCOUNTER — Ambulatory Visit: Admitting: Physician Assistant

## 2024-07-11 NOTE — Progress Notes (Signed)
 Agree with the assessment and plan as outlined by Quentin Mulling, PA-C. ? ?Keron Neenan, DO, FACG ? ?

## 2024-07-31 ENCOUNTER — Other Ambulatory Visit: Payer: Self-pay | Admitting: Internal Medicine

## 2024-08-06 NOTE — Progress Notes (Deleted)
 08/06/2024 Tracey Graham [id number] [birth year]  Referring provider: Jolee Madelin Patch, MD Primary GI doctor: Dr. San (Dr. Eda)  ASSESSMENT AND PLAN:  AB pain per mom, worse with lying down, moving her legs with chronic constipation 05/18/2024 CT abdominal pelvis without contrast for abdominal pain in the ER shows no acute inflammatory process, normal liver normal gallbladder normal pancreas normal spleen normal bowels, small to moderate stool burden, prednisone  was given with some help 04/2024 labs without anemia Linzess  145 not helping, increased linzess  to 290 mcg -consider motegrity if this does not help or every other day senokot - can try salon pas patches, heating pad and tylenol  for possible MSK pain  History of class A reflux esophagitis and gastric ulcers likely secondary to ibuprofen  on EGD in 2020 02/2020 negative H. pylori stool She has beltching and not wanting to eat as much No NSAIDS, no ETOH, she did do 5 days of prednisone  She is not on medication for reflux Will add on allginate therapy for possible reflux If fixing the constipation does not help the discomfort, can consider EGD in the hospital and treatment with PPI  Elevated LFTs 2021 alk phos 129 ALT 48 AST 38 normal bilirubin 04/27/2022 alk phos 119, AST 13 ALT 12 total bilirubin 0.3 05/18/2024 CT and pelvis without contrast unremarkable liver and gallbladder - Suggest rechecking liver function  Cerebral palsy with VP shunt for hydrocephalus 2014 and seizure disorder Mostly nonverbal and legally blind Patient is wheelchair-bound  Patient Care Team: Jolee Madelin Patch, MD as PCP - General (Family Medicine)  HISTORY OF PRESENT ILLNESS: 38 y.o. female with a past medical history listed below presents for evaluation of constipation and AB pain.   Patient last seen in the office by myself on 06/25/2024. Mother is here and provides history, patient with history of CP, mostly nonverbal and  legally blind in wheelchair.  Discussed the use of AI scribe software for clinical note transcription with the patient, who gave verbal consent to proceed.  History of Present Illness            She  reports that she has never smoked. She has never used smokeless tobacco. She reports that she does not drink alcohol and does not use drugs.  RELEVANT GI HISTORY, IMAGING AND LABS: Results         EGD 10/25/19 showed LA Grade C reflux esophagitis, 4 cratered gastric ulcers the largest measuring 10mm, gastritis, a gastric pedunculated polyp   H pylori stool antigen negative   UGI series 03/17/20: no large gastric ulcer identified   CT with contrast 02/26/20: no acute findings   CT 06/26/20 showed no acute findings   CBC    Component Value Date/Time   WBC 4.2 04/27/2022 1015   RBC 5.20 (H) 04/27/2022 1015   HGB 15.2 (H) 04/27/2022 1015   HCT 43.5 04/27/2022 1015   HCT 28.8 (L) 10/28/2019 1025   PLT 198.0 04/27/2022 1015   MCV 83.6 04/27/2022 1015   MCH 24.9 (L) 02/26/2020 1050   MCHC 34.9 04/27/2022 1015   RDW 14.3 04/27/2022 1015   LYMPHSABS 1.5 04/27/2022 1015   MONOABS 0.3 04/27/2022 1015   EOSABS 0.0 04/27/2022 1015   BASOSABS 0.0 04/27/2022 1015   No results for input(s): HGB in the last 8760 hours.  CMP     Component Value Date/Time   NA 137 04/27/2022 1015   K 4.4 04/27/2022 1015   CL 102 04/27/2022 1015   CO2 27 04/27/2022 1015  GLUCOSE 83 04/27/2022 1015   BUN 6 04/27/2022 1015   CREATININE 0.50 04/27/2022 1015   CALCIUM 8.9 04/27/2022 1015   PROT 7.6 04/27/2022 1015   ALBUMIN  4.3 04/27/2022 1015   AST 13 04/27/2022 1015   ALT 12 04/27/2022 1015   ALKPHOS 119 (H) 04/27/2022 1015   BILITOT 0.3 04/27/2022 1015   GFRNONAA >60 02/26/2020 1050   GFRAA >60 02/26/2020 1050      Latest Ref Rng & Units 04/27/2022   10:15 AM 06/19/2020   10:01 AM 02/26/2020   10:50 AM  Hepatic Function  Total Protein 6.0 - 8.3 g/dL 7.6  7.6  8.1   Albumin  3.5 - 5.2 g/dL 4.3   4.3  3.9   AST 0 - 37 U/L 13  13  38   ALT 0 - 35 U/L 12  16  48   Alk Phosphatase 39 - 117 U/L 119  131  129   Total Bilirubin 0.2 - 1.2 mg/dL 0.3  0.2  0.8       Current Medications:   Current Outpatient Medications (Endocrine & Metabolic):    predniSONE  (DELTASONE ) 50 MG tablet, Take 1 tablet (50 mg total) by mouth daily with breakfast. (Patient not taking: Reported on 06/25/2024)    Current Outpatient Medications (Analgesics):    acetaminophen  (TYLENOL ) 160 MG/5ML liquid, Take 500 mg by mouth every 4 (four) hours as needed for fever.   celecoxib  (CELEBREX ) 200 MG capsule, Take 1 capsule (200 mg total) by mouth 2 (two) times daily. (Patient not taking: Reported on 06/25/2024)   Current Outpatient Medications (Other):    linaclotide  (LINZESS ) 145 MCG CAPS capsule, Take 1 capsule (145 mcg total) by mouth daily before breakfast.   PHENobarbital  (LUMINAL) 97.2 MG tablet, Take 97.2 mg by mouth at bedtime.    polyethylene glycol (MIRALAX ) packet, Take 17 g by mouth daily. (Patient not taking: Reported on 06/25/2024)  Medical History:  Past Medical History:  Diagnosis Date   DVT (deep venous thrombosis) (HCC)    Hydrocephalus (HCC)    Seizures (HCC)    VP (ventriculoperitoneal) shunt status    Allergies:  Allergies  Allergen Reactions   Penicillins Swelling    Did not have reaction to 5d of amoxicillin Has patient had a PCN reaction causing immediate rash, facial/tongue/throat swelling, SOB or lightheadedness with hypotension: Yes Has patient had a PCN reaction causing severe rash involving mucus membranes or skin necrosis: No Has patient had a PCN reaction that required hospitalization No Has patient had a PCN reaction occurring within the last 10 years: No If all of the above answers are NO, then may proceed with Cephalosporin use.      Surgical History:  She  has a past surgical history that includes Ventriculoperitoneal shunt; Cardiac surgery; and  Esophagogastroduodenoscopy (egd) with propofol  (N/A, 10/25/2019). Family History:  Her family history includes Diabetes in her father; Hypertension in her father and mother.  REVIEW OF SYSTEMS  : All other systems reviewed and negative except where noted in the History of Present Illness.  PHYSICAL EXAM: There were no vitals taken for this visit. Physical Exam   GENERAL APPEARANCE: Well nourished, in no apparent distress, in her wheel chair HEENT: No cervical lymphadenopathy, unremarkable thyroid, sclerae anicteric, conjunctiva pink. RESPIRATORY: Respiratory effort normal, breath sounds clear bilaterally without rales, rhonchi, or wheezing. CARDIO: Regular rate and rhythm with no murmurs, rubs, or gallops, peripheral pulses intact. ABDOMEN: Soft, non-distended, hypoactive bowel sounds in all four quadrants, no tenderness to palpation,  no rebound, no mass appreciated. RECTAL: Declines. MUSCULOSKELETAL: upper spasticity, in wheelchair, non pitting edema SKIN: Dry, intact without rashes or lesions. No jaundice. NEURO: Alert, PSYCH: Cooperative, normal mood and affect. NECK: Supple.      Alan JONELLE Coombs, PA-C 12:51 PM

## 2024-08-07 ENCOUNTER — Ambulatory Visit: Admitting: Physician Assistant

## 2024-08-08 ENCOUNTER — Other Ambulatory Visit: Payer: Self-pay

## 2024-08-08 ENCOUNTER — Other Ambulatory Visit: Payer: Self-pay | Admitting: Internal Medicine

## 2024-08-08 ENCOUNTER — Telehealth: Payer: Self-pay | Admitting: Physician Assistant

## 2024-08-08 MED ORDER — LINACLOTIDE 290 MCG PO CAPS: 290.0000 ug | ORAL_CAPSULE | Freq: Every day | ORAL | 5 refills | Status: AC

## 2024-08-08 NOTE — Telephone Encounter (Signed)
 [name] rx sent

## 2024-08-08 NOTE — Telephone Encounter (Signed)
 Inbound call from patient's mother requesting a refill for Linzess  sent to pharmacy. Please advise, thank you

## 2024-11-27 ENCOUNTER — Telehealth: Payer: Self-pay | Admitting: Physician Assistant

## 2024-11-27 MED ORDER — PANTOPRAZOLE SODIUM 20 MG PO TBEC: 20.0000 mg | DELAYED_RELEASE_TABLET | Freq: Every day | ORAL | 1 refills | Status: AC

## 2024-11-27 NOTE — Telephone Encounter (Signed)
 Spoke with mother & instructed on [name]'s recommendations below. Mother wrote down directions for MiraLAX  Dulcolax bowel purge and read instructions back. Mother will also restart [name] daily tomorrow & add [name] 1 tablespoon daily. If diarrhea continues, mother verbalized she will call back. Mother aware of new prescription for Pantoprazole  & will pick this up from the pharmacy. If any abdominal pain, nausea, vomiting and inability to hold anything down she will take patient to the ER. If things improve, mother will call the office for refills. No questions at this time.   Tracey Graham Tracey Graham to Me [facility]    11/27/24 11:59 AM Please get her to do MiraLAX  Dulcolax bowel purge. Then started on [name] daily with [name] 1 tablespoon daily. If she is still having diarrhea after this we can try switching her to [name] 3 mg daily with 1 tablespoon [name]. Please add on pantoprazole  20 mg once daily for nausea and abdominal pain, #30, 1 RF If patient has any severe abdominal pain, nausea vomiting able to hold anything down please go to the ER. Tracey

## 2024-11-27 NOTE — Telephone Encounter (Signed)
 Spoke with mother. States that patient had a small amount of bowel movement last week. The patient was moaning & whining like she was in pain with her stomach. Mother did give her an enema pn Sunday however she only passed 2 little balls. She also gave her a suppository but no bowel movement. Mother gave her Senokot this morning however no bowel movement was passed. Mother has been giving her the [name] mcg; however this seemed to give the patient diarrhea when she was giving it every day so she has only been giving it to her every other day. Being wheelchair bound it is more difficult for her to pass her stool sitting up so she has been trying to lay her down to help. Mother reports patient does pass a lot of gas when she does this. Patient is eating and drinking. Unsure if she is nauseous or not. Patient has been moving her mouth like she may be. Please advise.

## 2024-11-27 NOTE — Telephone Encounter (Signed)
 Inbound call from patients mother stating that patient is very constipated. She states she has tried everything she knows to do, even a Enema. She states with the Enema she did have some stool after but is requesting a  call to discuss next steps. Please advise.

## 2024-12-03 NOTE — Telephone Encounter (Signed)
 Patient has cerebral palsy and is wheel-chair bound. History of constipation. Completed Miralax  bowel purge last week, now taking [name] mcg (145 mg ineffective) daily in addition to [name] once a day. Little to no bowel movement since 12/3. Alan, [place] is out of the office all week.  Any additional recommendations at this time?

## 2025-01-02 ENCOUNTER — Ambulatory Visit (HOSPITAL_COMMUNITY): Payer: Self-pay

## 2025-01-02 ENCOUNTER — Ambulatory Visit (HOSPITAL_COMMUNITY)

## 2025-01-03 ENCOUNTER — Ambulatory Visit: Payer: Self-pay

## 2025-01-19 ENCOUNTER — Ambulatory Visit (HOSPITAL_COMMUNITY)

## 2025-01-19 ENCOUNTER — Encounter (HOSPITAL_COMMUNITY): Payer: Self-pay

## 2025-01-19 ENCOUNTER — Inpatient Hospital Stay (HOSPITAL_COMMUNITY)
Admission: RE | Admit: 2025-01-19 | Discharge: 2025-01-19 | Payer: Self-pay | Attending: Emergency Medicine | Admitting: Emergency Medicine

## 2025-01-19 VITALS — BP 123/74 | HR 89 | Temp 98.4°F | Resp 18

## 2025-01-19 DIAGNOSIS — R0981 Nasal congestion: Secondary | ICD-10-CM | POA: Diagnosis not present

## 2025-01-19 DIAGNOSIS — J069 Acute upper respiratory infection, unspecified: Secondary | ICD-10-CM | POA: Diagnosis not present

## 2025-01-19 DIAGNOSIS — L309 Dermatitis, unspecified: Secondary | ICD-10-CM

## 2025-01-19 DIAGNOSIS — R051 Acute cough: Secondary | ICD-10-CM

## 2025-01-19 MED ORDER — TRIAMCINOLONE ACETONIDE 0.1 % EX CREA: 1.0000 | TOPICAL_CREAM | Freq: Two times a day (BID) | CUTANEOUS | 0 refills | Status: AC

## 2025-01-19 MED ORDER — CETIRIZINE HCL 1 MG/ML PO SOLN: 10.0000 mg | Freq: Every day | ORAL | 0 refills | Status: AC

## 2025-01-19 MED ORDER — PREDNISOLONE 15 MG/5ML PO SOLN: 30.0000 mg | Freq: Every day | ORAL | 0 refills | Status: AC

## 2025-01-19 NOTE — Discharge Instructions (Addendum)
 As discussed I believe her symptoms are likely related to ongoing symptoms after viral respiratory illness. You can continue giving her Mucinex  and using nasal sprays to help with nasal congestion. I have also prescribed 3 days of prednisolone  that you can give her once daily preferably in the mornings to help with inflammation related to her symptoms. You can also give her Zyrtec  once daily at bedtime to help with nasal congestion as well.  I prescribed triamcinolone  cream that you can apply to the area on her earlobe twice daily.  You can also apply Aquaphor throughout the day to this area. Follow-up with her primary care provider or return here as needed.

## 2025-01-19 NOTE — ED Triage Notes (Signed)
 Cough and nasal congestion started 1-2 weeks ago. Mother has requested a chest- xray for her cough.

## 2025-01-19 NOTE — ED Provider Notes (Signed)
 " [facility]    CSN: [id number] Arrival date & time: 01/19/25  9177      History   Chief Complaint Chief Complaint  Patient presents with   Nasal Congestion    Would like xray to make sure no pneumonia - Entered by patient    HPI Lataysha Vohra is a 39 y.o. female.   Patient presents with mother who provides HPI.  Mother reports that patient has continued intermittent productive cough and nasal congestion after being seen by primary care provider on 1/12.  Mother reports that patient was diagnosed with an ear infection at that time and was started on azithromycin .  Mother reports that she has been using saline nasal spray and giving Mucinex  with minimal relief.  Mother reports that patient is unable to verbalize if she is having any chest discomfort or difficulty breathing and therefore would like a chest x-ray today to confirm that she does not have any underlying pneumonia due to continued symptoms.  Mother denies any fever or lethargy.  Mother denies any signs of difficulty breathing other than difficulty breathing through her nose which worsens at night when she lays down.  The history is provided by a relative and medical records. The history is limited by a developmental delay.    Past Medical History:  Diagnosis Date   DVT (deep venous thrombosis) (HCC)    Hydrocephalus (HCC)    Seizures (HCC)    VP (ventriculoperitoneal) shunt status     Patient Active Problem List   Diagnosis Date Noted   RLQ abdominal pain 06/19/2020   Hypoxia    Sinus tachycardia 11/12/2019   Multiple gastric ulcers 11/12/2019   AMS (altered mental status)    Abdominal pain    Nuchal rigidity    Aseptic meningitis    Constipation    Deep vein thrombosis (DVT) of femoral vein of left lower extremity (HCC)    Infection of VP (ventriculoperitoneal) shunt 10/13/2019   Hydrocephalus (HCC)    VP (ventriculoperitoneal) shunt status    Encephalopathy    Dysphagia 12/13/2013    Legally blind 03/28/2013   History of hydrocephalus 03/28/2013    Past Surgical History:  Procedure Laterality Date   CARDIAC SURGERY     ESOPHAGOGASTRODUODENOSCOPY (EGD) WITH PROPOFOL  N/A 10/25/2019   Procedure: ESOPHAGOGASTRODUODENOSCOPY (EGD) WITH PROPOFOL ;  Surgeon: Wilhelmenia Aloha Raddle., MD;  Location: [facility] ENDOSCOPY;  Service: Gastroenterology;  Laterality: N/A;   VENTRICULOPERITONEAL SHUNT      OB History   No obstetric history on file.      Home Medications    Prior to Admission medications  Medication Sig Start Date End Date Taking? Authorizing Provider  cetirizine  HCl (ZYRTEC ) 1 MG/ML solution Take 10 mLs (10 mg total) by mouth daily. 01/19/25  Yes Johnie Flaming A, NP  linaclotide  (LINZESS ) 290 MCG CAPS capsule Take 1 capsule (290 mcg total) by mouth daily before breakfast. 08/08/24 02/04/25 Yes Craig Alan SAUNDERS, PA-C  PHENobarbital  (LUMINAL) 97.2 MG tablet Take 97.2 mg by mouth at bedtime.    Yes [provider]  prednisoLONE  (PRELONE ) 15 MG/5ML SOLN Take 10 mLs (30 mg total) by mouth daily before breakfast for 3 days. 01/19/25 01/22/25 Yes Emara Lichter A, NP  triamcinolone  cream (KENALOG ) 0.1 % Apply 1 Application topically 2 (two) times daily. 01/19/25  Yes Johnie Flaming A, NP  acetaminophen  (TYLENOL ) 160 MG/5ML liquid Take 500 mg by mouth every 4 (four) hours as needed for fever.    [provider]  celecoxib  (CELEBREX )  200 MG capsule Take 1 capsule (200 mg total) by mouth 2 (two) times daily. Patient not taking: No sig reported 05/18/24   Dean Clarity, MD  pantoprazole  (PROTONIX ) 20 MG tablet Take 1 tablet (20 mg total) by mouth daily. 11/27/24   Craig Alan SAUNDERS, PA-C  polyethylene glycol (MIRALAX ) packet Take 17 g by mouth daily. Patient not taking: No sig reported 01/06/13   Schinlever, Dorothyann, PA-C    Family History Family History  Problem Relation Age of Onset   Hypertension Mother    Hypertension Father    Diabetes Father      Social History Social History[1]   Allergies   Penicillins   Review of Systems Review of Systems  Per HPI  Physical Exam Triage Vital Signs ED Triage Vitals [01/19/25 0837]  Encounter Vitals Group     BP 123/74     Girls Systolic BP Percentile      Girls Diastolic BP Percentile      Boys Systolic BP Percentile      Boys Diastolic BP Percentile      Pulse Rate 89     Resp 18     Temp 98.4 F (36.9 C)     Temp Source Oral     SpO2 96 %     Weight      Height      Head Circumference      Peak Flow      Pain Score      Pain Loc      Pain Education      Exclude from Growth Chart    No data found.  Updated Vital Signs BP 123/74 (BP Location: Left Arm)   Pulse 89   Temp 98.4 F (36.9 C) (Oral)   Resp 18   LMP 01/06/2025   SpO2 96%   Visual Acuity Right Eye Distance:   Left Eye Distance:   Bilateral Distance:    Right Eye Near:   Left Eye Near:    Bilateral Near:     Physical Exam Vitals and nursing note reviewed.  Constitutional:      General: She is awake. She is not in acute distress.    Appearance: Normal appearance. She is well-developed and well-groomed. She is not ill-appearing.  HENT:     Right Ear: Tympanic membrane, ear canal and external ear normal.     Left Ear: Tympanic membrane and ear canal normal.     Ears:      Comments: Mild dryness and irritation noted to left earlobe    Nose: Congestion and rhinorrhea present.     Mouth/Throat:     Mouth: Mucous membranes are moist.     Pharynx: Posterior oropharyngeal erythema and postnasal drip present. No oropharyngeal exudate.  Cardiovascular:     Rate and Rhythm: Normal rate and regular rhythm.  Pulmonary:     Effort: Pulmonary effort is normal.     Breath sounds: Normal breath sounds.  Skin:    General: Skin is warm and dry.  Neurological:     General: No focal deficit present.     Mental Status: She is alert and oriented to person, place, and time. Mental status is at baseline.   Psychiatric:        Behavior: Behavior is cooperative.      UC Treatments / Results  Labs (all labs ordered are listed, but only abnormal results are displayed) Labs Reviewed - No data to display  EKG   Radiology No results found.  Procedures Procedures (including critical care time)  Medications Ordered in UC Medications - No data to display  Initial Impression / Assessment and Plan / UC Course  I have reviewed the triage vital signs and the nursing notes.  Pertinent labs & imaging results that were available during my care of the patient were reviewed by me and considered in my medical decision making (see chart for details).     Patient is overall well-appearing.  Vitals are stable.  Lungs clear bilaterally.  Initially ordered chest x-ray to rule out underlying pneumonia due to mother's concern for this.  Radiology technician did attempt to do this but was unable to do so due to patient being wheelchair-bound.  Discussed with mother that I have low suspicion for pneumonia due to patient already being on a course of antibiotics and the main symptom at this time is nasal congestion.  Mother is agreeable and understanding of this.  Prescribed triamcinolone  cream for dermatitis of left earlobe.  Prescribed Zyrtec  to give once daily at bedtime to help with nasal congestion and cough.  Prescribed short course of prednisone  to help with inflammation related to the symptoms as well.  Recommended continue with Mucinex  and nasal sprays for additional relief.  Discussed follow-up and return precautions. Final Clinical Impressions(s) / UC Diagnoses   Final diagnoses:  Acute cough  Nasal congestion  Dermatitis of external ear  Acute upper respiratory infection     Discharge Instructions      As discussed I believe her symptoms are likely related to ongoing symptoms after viral respiratory illness. You can continue giving her Mucinex  and using nasal sprays to help with nasal  congestion. I have also prescribed 3 days of prednisolone  that you can give her once daily preferably in the mornings to help with inflammation related to her symptoms. You can also give her Zyrtec  once daily at bedtime to help with nasal congestion as well.  I prescribed triamcinolone  cream that you can apply to the area on her earlobe twice daily.  You can also apply Aquaphor throughout the day to this area. Follow-up with her primary care provider or return here as needed.     ED Prescriptions     Medication Sig Dispense Auth. Provider   prednisoLONE  (PRELONE ) 15 MG/5ML SOLN Take 10 mLs (30 mg total) by mouth daily before breakfast for 3 days. 30 mL Johnie Flaming A, NP   cetirizine  HCl (ZYRTEC ) 1 MG/ML solution Take 10 mLs (10 mg total) by mouth daily. 118 mL Johnie Flaming A, NP   triamcinolone  cream (KENALOG ) 0.1 % Apply 1 Application topically 2 (two) times daily. 30 g Johnie Flaming A, NP      PDMP not reviewed this encounter.    [1]  Social History Tobacco Use   Smoking status: Never   Smokeless tobacco: Never  Vaping Use   Vaping status: Never Used  Substance Use Topics   Alcohol use: No   Drug use: No     Johnie Flaming A, NP 01/19/25 0930  "
